# Patient Record
Sex: Female | Born: 1950
Health system: Southern US, Community
[De-identification: ages and names within clinical notes are randomized; demographics above are authoritative.]

## PROBLEM LIST (undated history)

## (undated) ENCOUNTER — Emergency Department (HOSPITAL_COMMUNITY): Discharge status: Absent without leave | Payer: PPO

## (undated) DIAGNOSIS — E669 Obesity, unspecified: Secondary | ICD-10-CM

## (undated) DIAGNOSIS — G35 Multiple sclerosis: Secondary | ICD-10-CM

## (undated) DIAGNOSIS — E785 Hyperlipidemia, unspecified: Secondary | ICD-10-CM

## (undated) DIAGNOSIS — J189 Pneumonia, unspecified organism: Secondary | ICD-10-CM

## (undated) DIAGNOSIS — L930 Discoid lupus erythematosus: Secondary | ICD-10-CM

## (undated) DIAGNOSIS — I10 Essential (primary) hypertension: Secondary | ICD-10-CM

## (undated) DIAGNOSIS — R002 Palpitations: Secondary | ICD-10-CM

## (undated) DIAGNOSIS — F17210 Nicotine dependence, cigarettes, uncomplicated: Secondary | ICD-10-CM

## (undated) DIAGNOSIS — M4800 Spinal stenosis, site unspecified: Secondary | ICD-10-CM

## (undated) DIAGNOSIS — M1990 Unspecified osteoarthritis, unspecified site: Secondary | ICD-10-CM

## (undated) DIAGNOSIS — M797 Fibromyalgia: Secondary | ICD-10-CM

## (undated) DIAGNOSIS — R011 Cardiac murmur, unspecified: Secondary | ICD-10-CM

## (undated) HISTORY — DX: Nicotine dependence, cigarettes, uncomplicated: F17.210

## (undated) HISTORY — DX: Essential (primary) hypertension: I10

## (undated) HISTORY — DX: Obesity, unspecified: E66.9

## (undated) HISTORY — DX: Hyperlipidemia, unspecified: E78.5

## (undated) HISTORY — DX: Spinal stenosis, site unspecified: M48.00

## (undated) HISTORY — PX: BREAST SURGERY: SHX581

## (undated) HISTORY — DX: Discoid lupus erythematosus: L93.0

---

## 1992-02-17 HISTORY — PX: CHOLECYSTECTOMY: SHX55

## 1993-02-16 HISTORY — PX: ABDOMINAL HYSTERECTOMY: SHX81

## 2009-03-18 ENCOUNTER — Emergency Department (HOSPITAL_COMMUNITY): Admission: EM | Admit: 2009-03-18 | Discharge: 2009-03-18 | Payer: Self-pay | Admitting: Emergency Medicine

## 2010-01-06 ENCOUNTER — Emergency Department (HOSPITAL_COMMUNITY): Admission: EM | Admit: 2010-01-06 | Discharge: 2010-01-06 | Payer: Self-pay | Admitting: Emergency Medicine

## 2010-04-29 LAB — URINALYSIS, ROUTINE W REFLEX MICROSCOPIC
Hgb urine dipstick: NEGATIVE
Nitrite: NEGATIVE
Protein, ur: NEGATIVE mg/dL
Specific Gravity, Urine: 1.029 (ref 1.005–1.030)
Urobilinogen, UA: 0.2 mg/dL (ref 0.0–1.0)

## 2010-04-29 LAB — COMPREHENSIVE METABOLIC PANEL
AST: 18 U/L (ref 0–37)
Albumin: 3.9 g/dL (ref 3.5–5.2)
Alkaline Phosphatase: 106 U/L (ref 39–117)
BUN: 14 mg/dL (ref 6–23)
Creatinine, Ser: 0.82 mg/dL (ref 0.4–1.2)
GFR calc Af Amer: >60 mL/min (ref >60)
Potassium: 4.3 mEq/L (ref 3.5–5.1)
Total Protein: 7.8 g/dL (ref 6.0–8.3)

## 2010-04-29 LAB — CBC
MCV: 85.6 fL (ref 78.0–100.0)
Platelets: 455 10*3/uL — ABNORMAL HIGH (ref 150–400)
RDW: 15.6 % — ABNORMAL HIGH (ref 11.5–15.5)
WBC: 12.4 10*3/uL — ABNORMAL HIGH (ref 4.0–10.5)

## 2010-04-29 LAB — POCT CARDIAC MARKERS
CKMB, poc: <1 ng/mL — ABNORMAL LOW (ref 1.0–8.0)
Myoglobin, poc: 70.3 ng/mL (ref 12–200)

## 2010-04-29 LAB — DIFFERENTIAL
Lymphocytes Relative: 24 % (ref 12–46)
Monocytes Absolute: 0.5 10*3/uL (ref 0.1–1.0)
Monocytes Relative: 4 % (ref 3–12)
Neutro Abs: 8.8 10*3/uL — ABNORMAL HIGH (ref 1.7–7.7)

## 2011-09-19 ENCOUNTER — Emergency Department (HOSPITAL_COMMUNITY)
Admission: EM | Admit: 2011-09-19 | Discharge: 2011-09-19 | Disposition: A | Discharge status: Home / Self Care | Payer: Medicare Other | Attending: Emergency Medicine | Admitting: Emergency Medicine

## 2011-09-19 ENCOUNTER — Emergency Department (HOSPITAL_COMMUNITY): Payer: Medicare Other

## 2011-09-19 DIAGNOSIS — R202 Paresthesia of skin: Secondary | ICD-10-CM

## 2011-09-19 DIAGNOSIS — IMO0001 Reserved for inherently not codable concepts without codable children: Secondary | ICD-10-CM | POA: Insufficient documentation

## 2011-09-19 DIAGNOSIS — R209 Unspecified disturbances of skin sensation: Secondary | ICD-10-CM | POA: Insufficient documentation

## 2011-09-19 DIAGNOSIS — R079 Chest pain, unspecified: Secondary | ICD-10-CM | POA: Insufficient documentation

## 2011-09-19 LAB — COMPREHENSIVE METABOLIC PANEL
ALT: 8 U/L (ref 0–35)
Alkaline Phosphatase: 105 U/L (ref 39–117)
CO2: 28 mEq/L (ref 19–32)
GFR calc Af Amer: >90 mL/min (ref >90)
GFR calc non Af Amer: >90 mL/min (ref >90)
Glucose, Bld: 87 mg/dL (ref 70–99)
Potassium: 4 mEq/L (ref 3.5–5.1)
Sodium: 138 mEq/L (ref 135–145)
Total Protein: 6.6 g/dL (ref 6.0–8.3)

## 2011-09-19 LAB — CBC WITH DIFFERENTIAL/PLATELET
Lymphocytes Relative: 47 % — ABNORMAL HIGH (ref 12–46)
Lymphs Abs: 3.7 10*3/uL (ref 0.7–4.0)
Neutro Abs: 3.3 10*3/uL (ref 1.7–7.7)
Neutrophils Relative %: 42 % — ABNORMAL LOW (ref 43–77)
Platelets: 384 10*3/uL (ref 150–400)
RBC: 4.91 MIL/uL (ref 3.87–5.11)
WBC: 7.9 10*3/uL (ref 4.0–10.5)

## 2011-09-19 MED ORDER — SODIUM CHLORIDE 0.9 % IV SOLN
Freq: Once | INTRAVENOUS | Status: AC
  Administered 2011-09-19: 16:00:00 via INTRAVENOUS

## 2011-09-19 NOTE — ED Notes (Signed)
C/o left sided chest pain under left breast radiating to left arm and left leg a- "all the way to my toes" . Not precipitated by any activity.

## 2011-09-19 NOTE — ED Provider Notes (Signed)
History     CSN: [id number]  Arrival date & time 09/19/11  1438   First MD Initiated Contact with Patient 09/19/11 1505      Chief Complaint  Patient presents with  . Chest Pain    started yesterday- left arm numb radiates to left foot  . Lupus  . Fibromyalgia    (Consider location/radiation/quality/duration/timing/severity/associated sxs/prior treatment) Patient is a 61 y.o. female presenting with chest pain. The history is provided by the patient.  Chest Pain   pt with left sided numbness at 24 hours lasting seconds to minutes--nothing makes sx better or worse--no headache or slurred speech--no prior h/o same--no anginal type sx--denies ataxia or visual changes--no tx used pta--  No past medical history on file.  No past surgical history on file.  No family history on file.  History  Substance Use Topics  . Smoking status: Not on file  . Smokeless tobacco: Not on file  . Alcohol Use: Not on file    OB History    No data available      Review of Systems  Cardiovascular: Positive for chest pain.  All other systems reviewed and are negative.    Allergies  Review of patient's allergies indicates no known allergies.  Home Medications   Current Outpatient Rx  Name Route Sig Dispense Refill  . ALBUTEROL SULFATE HFA 108 (90 BASE) MCG/ACT IN AERS Inhalation Inhale 2 puffs into the lungs every 6 (six) hours as needed. For shortness of breath.    . AMLODIPINE BESYLATE 5 MG PO TABS Oral Take 5 mg by mouth daily.    . ASPIRIN EC 81 MG PO TBEC Oral Take 162 mg by mouth daily.    . ATORVASTATIN CALCIUM 10 MG PO TABS Oral Take 10 mg by mouth daily.    . CYANOCOBALAMIN 1000 MCG/ML IJ SOLN Intramuscular Inject 1,000 mcg into the muscle once.    Marland Kitchen GABAPENTIN 300 MG PO CAPS Oral Take 300 mg by mouth 3 (three) times daily.    . MELOXICAM 15 MG PO TABS Oral Take 15 mg by mouth daily.    . TRAMADOL HCL 50 MG PO TABS Oral Take 100 mg by mouth 2 (two) times daily.      BP  145/85  Pulse 80  Temp 98.5 F (36.9 C) (Oral)  Resp 18  SpO2 97%  Physical Exam  Nursing note and vitals reviewed. Constitutional: She is oriented to person, place, and time. Vital signs are normal. She appears well-developed and well-nourished.  Non-toxic appearance. No distress.  HENT:  Head: Normocephalic and atraumatic.  Eyes: Conjunctivae, EOM and lids are normal. Pupils are equal, round, and reactive to light.  Neck: Normal range of motion. Neck supple. No tracheal deviation present. No mass present.  Cardiovascular: Normal rate, regular rhythm and normal heart sounds.  Exam reveals no gallop.   No murmur heard. Pulmonary/Chest: Effort normal and breath sounds normal. No stridor. No respiratory distress. She has no decreased breath sounds. She has no wheezes. She has no rhonchi. She has no rales.  Abdominal: Soft. Normal appearance and bowel sounds are normal. She exhibits no distension. There is no tenderness. There is no rebound and no CVA tenderness.  Musculoskeletal: Normal range of motion. She exhibits no edema and no tenderness.  Neurological: She is alert and oriented to person, place, and time. She has normal strength. No cranial nerve deficit or sensory deficit. GCS eye subscore is 4. GCS verbal subscore is 5. GCS motor subscore is 6.  Skin: Skin is warm and dry. No abrasion and no rash noted.  Psychiatric: She has a normal mood and affect. Her speech is normal and behavior is normal.    ED Course  Procedures (including critical care time)   Labs Reviewed  CBC WITH DIFFERENTIAL  COMPREHENSIVE METABOLIC PANEL   No results found.   No diagnosis found.    MDM   Date: 09/19/2011  Rate: 59  Rhythm: normal sinus rhythm  QRS Axis: normal  Intervals: normal  ST/T Wave abnormalities: normal  Conduction Disutrbances:none  Narrative Interpretation:   Old EKG Reviewed: unchanged  Head ct results noted, suspicious for MS, pt given neurology  referal          Toy Baker, MD 09/19/11 1700

## 2011-09-23 ENCOUNTER — Other Ambulatory Visit: Payer: Self-pay | Admitting: Neurology

## 2011-09-23 DIAGNOSIS — M329 Systemic lupus erythematosus, unspecified: Secondary | ICD-10-CM

## 2011-09-23 DIAGNOSIS — R202 Paresthesia of skin: Secondary | ICD-10-CM

## 2011-09-29 ENCOUNTER — Ambulatory Visit
Admission: RE | Admit: 2011-09-29 | Discharge: 2011-09-29 | Disposition: A | Discharge status: Home / Self Care | Payer: Medicare Other | Source: Ambulatory Visit | Attending: Neurology | Admitting: Neurology

## 2011-09-29 DIAGNOSIS — M329 Systemic lupus erythematosus, unspecified: Secondary | ICD-10-CM

## 2011-09-29 DIAGNOSIS — R202 Paresthesia of skin: Secondary | ICD-10-CM

## 2011-10-02 ENCOUNTER — Other Ambulatory Visit: Payer: Self-pay | Admitting: Neurology

## 2011-10-02 DIAGNOSIS — R9409 Abnormal results of other function studies of central nervous system: Secondary | ICD-10-CM

## 2011-10-02 DIAGNOSIS — R209 Unspecified disturbances of skin sensation: Secondary | ICD-10-CM

## 2011-10-02 DIAGNOSIS — M329 Systemic lupus erythematosus, unspecified: Secondary | ICD-10-CM

## 2011-10-09 ENCOUNTER — Ambulatory Visit
Admission: RE | Admit: 2011-10-09 | Discharge: 2011-10-09 | Disposition: A | Discharge status: Home / Self Care | Payer: Medicare Other | Source: Ambulatory Visit | Attending: Neurology | Admitting: Neurology

## 2011-10-09 ENCOUNTER — Other Ambulatory Visit: Payer: Self-pay | Admitting: Neurology

## 2011-10-09 DIAGNOSIS — M329 Systemic lupus erythematosus, unspecified: Secondary | ICD-10-CM

## 2011-10-09 DIAGNOSIS — R9409 Abnormal results of other function studies of central nervous system: Secondary | ICD-10-CM

## 2011-10-09 DIAGNOSIS — R209 Unspecified disturbances of skin sensation: Secondary | ICD-10-CM

## 2011-10-23 ENCOUNTER — Other Ambulatory Visit: Payer: Self-pay | Admitting: Neurology

## 2011-10-28 ENCOUNTER — Other Ambulatory Visit: Payer: Self-pay | Admitting: Neurology

## 2011-10-28 DIAGNOSIS — R202 Paresthesia of skin: Secondary | ICD-10-CM

## 2011-10-28 DIAGNOSIS — M329 Systemic lupus erythematosus, unspecified: Secondary | ICD-10-CM

## 2011-10-28 DIAGNOSIS — G35 Multiple sclerosis: Secondary | ICD-10-CM

## 2011-10-28 DIAGNOSIS — R9089 Other abnormal findings on diagnostic imaging of central nervous system: Secondary | ICD-10-CM

## 2011-11-02 ENCOUNTER — Ambulatory Visit
Admission: RE | Admit: 2011-11-02 | Discharge: 2011-11-02 | Disposition: A | Discharge status: Home / Self Care | Payer: Medicare Other | Source: Ambulatory Visit | Attending: Neurology | Admitting: Neurology

## 2011-11-02 VITALS — BP 130/78 | HR 76

## 2011-11-02 DIAGNOSIS — R9089 Other abnormal findings on diagnostic imaging of central nervous system: Secondary | ICD-10-CM

## 2011-11-02 DIAGNOSIS — M329 Systemic lupus erythematosus, unspecified: Secondary | ICD-10-CM

## 2011-11-02 DIAGNOSIS — R9409 Abnormal results of other function studies of central nervous system: Secondary | ICD-10-CM

## 2011-11-02 DIAGNOSIS — R209 Unspecified disturbances of skin sensation: Secondary | ICD-10-CM

## 2011-11-02 DIAGNOSIS — G35 Multiple sclerosis: Secondary | ICD-10-CM

## 2011-11-02 DIAGNOSIS — R202 Paresthesia of skin: Secondary | ICD-10-CM

## 2011-11-02 LAB — CSF CELL COUNT WITH DIFFERENTIAL
Eosinophils, CSF: 2 % — ABNORMAL HIGH (ref 0–1)
Monocyte/Macrophage: 11 % — ABNORMAL LOW (ref 15–45)
Segmented Neutrophils-CSF: 72 % — ABNORMAL HIGH (ref 0–6)

## 2011-11-02 NOTE — Progress Notes (Signed)
Pt's blood drawn to go with spinal fluid test, from right antecubital vein, site unremarkable and pt tolerated blood draw well. JKL RN

## 2011-11-02 NOTE — Progress Notes (Signed)
Patient resting quietly in nursing station with daughter at bedside.  Donell Sievert, RN

## 2011-11-03 LAB — GRAM STAIN: Gram Stain: NONE SEEN

## 2011-11-04 LAB — VDRL, CSF

## 2011-11-05 LAB — CNS IGG SYNTHESIS RATE, CSF+BLOOD
Albumin, Serum(Neph): 3.7 g/dL (ref 3.2–4.6)
IgG Index, CSF: 1.12 — ABNORMAL HIGH (ref <0.66)
IgG, CSF: 22.5 mg/dL — ABNORMAL HIGH (ref 0.8–7.7)
IgG, Serum: 1180 mg/dL (ref 694–1618)

## 2011-11-10 LAB — MULTIPLE SCLEROSIS PANEL 2
CNS-IgG Synthesis Rate: 394.6 mg/24hr — ABNORMAL HIGH (ref <3.3)
IgA CSF: 0.95 mg/dL — ABNORMAL HIGH (ref 0.15–0.60)
IgA Total: 307 mg/dL (ref 81–463)
IgG Total: 1118 mg/dL (ref 694–1618)
IgG-Index: 1.11 — ABNORMAL HIGH (ref <0.70)
IgM-CSF: >6 mg/dL — ABNORMAL HIGH (ref <0.10)

## 2011-11-12 ENCOUNTER — Ambulatory Visit: Payer: Medicare Other | Admitting: Physical Therapy

## 2011-11-17 ENCOUNTER — Ambulatory Visit: Payer: Medicare Other | Admitting: Physical Therapy

## 2011-11-25 ENCOUNTER — Ambulatory Visit: Payer: Medicare Other | Admitting: Physical Therapy

## 2011-12-22 ENCOUNTER — Ambulatory Visit: Payer: Medicare Other | Attending: Neurology | Admitting: Physical Therapy

## 2011-12-22 DIAGNOSIS — M6281 Muscle weakness (generalized): Secondary | ICD-10-CM | POA: Insufficient documentation

## 2011-12-22 DIAGNOSIS — IMO0001 Reserved for inherently not codable concepts without codable children: Secondary | ICD-10-CM | POA: Insufficient documentation

## 2011-12-31 ENCOUNTER — Ambulatory Visit: Payer: Medicare Other | Admitting: Physical Therapy

## 2012-01-06 ENCOUNTER — Ambulatory Visit: Payer: Medicare Other | Admitting: Physical Therapy

## 2012-01-08 ENCOUNTER — Ambulatory Visit: Payer: Medicare Other | Admitting: Physical Therapy

## 2012-01-11 ENCOUNTER — Ambulatory Visit: Payer: Medicare Other | Admitting: Physical Therapy

## 2012-01-20 ENCOUNTER — Ambulatory Visit: Payer: Medicare Other | Attending: Neurology | Admitting: Physical Therapy

## 2012-01-20 ENCOUNTER — Ambulatory Visit: Payer: Medicare Other | Admitting: *Deleted

## 2012-01-20 DIAGNOSIS — M6281 Muscle weakness (generalized): Secondary | ICD-10-CM | POA: Insufficient documentation

## 2012-01-20 DIAGNOSIS — IMO0001 Reserved for inherently not codable concepts without codable children: Secondary | ICD-10-CM | POA: Insufficient documentation

## 2012-01-22 ENCOUNTER — Ambulatory Visit: Payer: Medicare Other | Admitting: Physical Therapy

## 2012-02-15 ENCOUNTER — Ambulatory Visit: Payer: Medicare Other | Admitting: Occupational Therapy

## 2012-02-18 ENCOUNTER — Ambulatory Visit: Payer: Medicare Other | Attending: Neurology | Admitting: Occupational Therapy

## 2012-02-18 DIAGNOSIS — M6281 Muscle weakness (generalized): Secondary | ICD-10-CM | POA: Insufficient documentation

## 2012-02-18 DIAGNOSIS — IMO0001 Reserved for inherently not codable concepts without codable children: Secondary | ICD-10-CM | POA: Insufficient documentation

## 2012-02-18 DIAGNOSIS — R279 Unspecified lack of coordination: Secondary | ICD-10-CM | POA: Insufficient documentation

## 2012-02-18 DIAGNOSIS — R5381 Other malaise: Secondary | ICD-10-CM | POA: Insufficient documentation

## 2012-02-18 DIAGNOSIS — R41841 Cognitive communication deficit: Secondary | ICD-10-CM | POA: Insufficient documentation

## 2012-02-23 ENCOUNTER — Encounter: Payer: Medicare Other | Admitting: Occupational Therapy

## 2012-02-25 ENCOUNTER — Ambulatory Visit: Payer: Medicare Other | Admitting: Occupational Therapy

## 2012-03-01 ENCOUNTER — Ambulatory Visit: Payer: Medicare Other | Admitting: Occupational Therapy

## 2012-03-03 ENCOUNTER — Ambulatory Visit: Payer: Medicare Other | Admitting: Occupational Therapy

## 2012-03-08 ENCOUNTER — Ambulatory Visit: Payer: Medicare Other | Admitting: Occupational Therapy

## 2012-03-10 ENCOUNTER — Ambulatory Visit: Payer: Medicare Other | Admitting: Occupational Therapy

## 2012-03-17 ENCOUNTER — Ambulatory Visit: Payer: Medicare Other | Admitting: Occupational Therapy

## 2012-03-22 ENCOUNTER — Ambulatory Visit: Payer: Medicare Other | Attending: Neurology | Admitting: Occupational Therapy

## 2012-03-22 DIAGNOSIS — IMO0001 Reserved for inherently not codable concepts without codable children: Secondary | ICD-10-CM | POA: Insufficient documentation

## 2012-03-22 DIAGNOSIS — M6281 Muscle weakness (generalized): Secondary | ICD-10-CM | POA: Insufficient documentation

## 2012-03-24 ENCOUNTER — Ambulatory Visit: Payer: Medicare Other | Admitting: Occupational Therapy

## 2012-03-31 ENCOUNTER — Ambulatory Visit: Payer: Medicare Other | Admitting: Occupational Therapy

## 2012-04-14 ENCOUNTER — Ambulatory Visit: Payer: Medicare Other | Admitting: Occupational Therapy

## 2012-05-04 ENCOUNTER — Ambulatory Visit: Payer: Medicare Other | Attending: Neurology | Admitting: Physical Therapy

## 2012-05-04 ENCOUNTER — Ambulatory Visit: Payer: Medicare Other | Admitting: Occupational Therapy

## 2012-05-04 DIAGNOSIS — IMO0001 Reserved for inherently not codable concepts without codable children: Secondary | ICD-10-CM | POA: Insufficient documentation

## 2012-05-04 DIAGNOSIS — M6281 Muscle weakness (generalized): Secondary | ICD-10-CM | POA: Insufficient documentation

## 2012-05-11 ENCOUNTER — Emergency Department (HOSPITAL_COMMUNITY)
Admission: EM | Admit: 2012-05-11 | Discharge: 2012-05-11 | Disposition: A | Discharge status: Home / Self Care | Payer: Medicare Other | Attending: Emergency Medicine | Admitting: Emergency Medicine

## 2012-05-11 ENCOUNTER — Encounter (HOSPITAL_COMMUNITY): Payer: Self-pay | Admitting: Emergency Medicine

## 2012-05-11 ENCOUNTER — Emergency Department (HOSPITAL_COMMUNITY): Payer: Medicare Other

## 2012-05-11 DIAGNOSIS — Z8739 Personal history of other diseases of the musculoskeletal system and connective tissue: Secondary | ICD-10-CM | POA: Insufficient documentation

## 2012-05-11 DIAGNOSIS — F17200 Nicotine dependence, unspecified, uncomplicated: Secondary | ICD-10-CM | POA: Insufficient documentation

## 2012-05-11 DIAGNOSIS — Z79899 Other long term (current) drug therapy: Secondary | ICD-10-CM | POA: Insufficient documentation

## 2012-05-11 DIAGNOSIS — Z7982 Long term (current) use of aspirin: Secondary | ICD-10-CM | POA: Insufficient documentation

## 2012-05-11 DIAGNOSIS — I10 Essential (primary) hypertension: Secondary | ICD-10-CM | POA: Insufficient documentation

## 2012-05-11 DIAGNOSIS — W010XXA Fall on same level from slipping, tripping and stumbling without subsequent striking against object, initial encounter: Secondary | ICD-10-CM | POA: Insufficient documentation

## 2012-05-11 DIAGNOSIS — Y9301 Activity, walking, marching and hiking: Secondary | ICD-10-CM | POA: Insufficient documentation

## 2012-05-11 DIAGNOSIS — Y9289 Other specified places as the place of occurrence of the external cause: Secondary | ICD-10-CM | POA: Insufficient documentation

## 2012-05-11 DIAGNOSIS — S93409A Sprain of unspecified ligament of unspecified ankle, initial encounter: Secondary | ICD-10-CM | POA: Insufficient documentation

## 2012-05-11 DIAGNOSIS — Z8669 Personal history of other diseases of the nervous system and sense organs: Secondary | ICD-10-CM | POA: Insufficient documentation

## 2012-05-11 HISTORY — DX: Fibromyalgia: M79.7

## 2012-05-11 HISTORY — DX: Multiple sclerosis: G35

## 2012-05-11 MED ORDER — OXYCODONE-ACETAMINOPHEN 5-325 MG PO TABS: ORAL_TABLET | ORAL | Status: DC

## 2012-05-11 NOTE — ED Provider Notes (Signed)
Medical screening examination/treatment/procedure(s) were performed by non-physician practitioner and as supervising physician I was immediately available for consultation/collaboration.  [name], MD 05/11/12 [phone]

## 2012-05-11 NOTE — ED Notes (Signed)
Pt states that she was walking on the driveway last Wednesday night and slipped and fell and her L leg below the knee is still hurting. No LOC,or  head trauma.

## 2012-05-11 NOTE — Progress Notes (Signed)
Pt confirms pcp is dwight williams EPIC updated  

## 2012-05-11 NOTE — ED Provider Notes (Signed)
History    This chart was scribed for non-physician practitioner working with Andrea Chick, MD by Andrea Cantrell, ED Scribe. This patient was seen in room WTR7/WTR7 and the patient's care was started at 4:09PM.   CSN: [id number]  Arrival date & time 05/11/12  1542   First MD Initiated Contact with Patient 05/11/12 1609      Chief Complaint  Patient presents with  . Leg Pain  . Fall    (Consider location/radiation/quality/duration/timing/severity/associated sxs/prior treatment) The history is provided by the patient. No language interpreter was used.    Andrea Cantrell is a 62 y.o. female , with a hx of hypertension, MS, fibromyalgia, who presents to the Emergency Department complaining of sudden, progressively worsening, leg pain located at the LLE, radiating downwards towards the left ankle, onset seven days ago (05/04/12).  Associated symptoms include swelling located at the LLE. The pt reports she was walking in her driveway last Wednesday evening, 05/04/12, where she suddenly slipped, fell, and impacted directly upon her LLE. Furthermore, the pt informs she can ambulate at the present time, however, the ambulation produces a severe pain sensation. The pt has applied a cold compress and elevated the LLE, however, neither of which provide relief of the leg pain.  The pt denies any preceding symptoms before the fall incident, LOC, and experiencing any recent SOB. Furthermore, the pt denies any hx of blood clots and taking estrogen/hormone replacements.  The pt is a current everyday smoker, (1.0 packs/day). However, she does not drink alcohol.      Past Medical History  Diagnosis Date  . Hypertension   . Fibromyalgia   . Lupus   . MS (multiple sclerosis)     Past Surgical History  Procedure Laterality Date  . Abdominal hysterectomy    . Cholecystectomy    . Cesarean section    . Breast surgery Left     tissue removal    No family history on file.  History  Substance Use  Topics  . Smoking status: Current Every Day Smoker -- 1.00 packs/day  . Smokeless tobacco: Not on file  . Alcohol Use: No    OB History   Grav Para Term Preterm Abortions TAB SAB Ect Mult Living                  Review of Systems  Respiratory: Negative for shortness of breath.   Cardiovascular: Negative for chest pain.  Gastrointestinal: Negative for diarrhea.  Musculoskeletal: Positive for arthralgias.  All other systems reviewed and are negative.    Allergies  Review of patient's allergies indicates no known allergies.  Home Medications   Current Outpatient Rx  Name  Route  Sig  Dispense  Refill  . albuterol (PROVENTIL HFA;VENTOLIN HFA) 108 (90 BASE) MCG/ACT inhaler   Inhalation   Inhale 2 puffs into the lungs every 6 (six) hours as needed. For shortness of breath.         Marland Kitchen amLODipine (NORVASC) 5 MG tablet   Oral   Take 5 mg by mouth daily.         Marland Kitchen aspirin EC 81 MG tablet   Oral   Take 162 mg by mouth daily.         Marland Kitchen atorvastatin (LIPITOR) 10 MG tablet   Oral   Take 10 mg by mouth daily.         . cyanocobalamin (,VITAMIN B-12,) 1000 MCG/ML injection   Intramuscular   Inject 1,000 mcg into the muscle  once.         . cyclobenzaprine (FLEXERIL) 5 MG tablet   Oral   Take 5 mg by mouth 3 (three) times daily as needed for muscle spasms.         Marland Kitchen gabapentin (NEURONTIN) 300 MG capsule   Oral   Take 300 mg by mouth 3 (three) times daily.         . meloxicam (MOBIC) 15 MG tablet   Oral   Take 15 mg by mouth daily.         . traMADol (ULTRAM) 50 MG tablet   Oral   Take 100 mg by mouth 2 (two) times daily.           BP 146/82  Pulse 96  Temp(Src) 98.3 F (36.8 C) (Oral)  SpO2 97%  Physical Exam  Nursing note and vitals reviewed. Constitutional: She is oriented to person, place, and time. She appears well-developed and well-nourished. No distress.  HENT:  Head: Normocephalic.  Eyes: Conjunctivae and EOM are normal.   Cardiovascular: Normal rate and intact distal pulses.   Good distal pulses. 3 + bilaterally.   Pulmonary/Chest: Effort normal. No stridor.  Musculoskeletal: Normal range of motion. She exhibits tenderness.       Left ankle: Tenderness. Lateral malleolus tenderness found.  Tenderness detected at the distal lateral malleolus. Mild swelling of the lateral portion of left ankle. Neurovascularly intact. Full ROM to both knees. 5/5 motor strength bilaterally to the lower extremities,   Neurological: She is alert and oriented to person, place, and time.  Psychiatric: She has a normal mood and affect.    ED Course  Procedures (including critical care time)  DIAGNOSTIC STUDIES: Oxygen Saturation is 97% on room air, normal by my interpretation.    COORDINATION OF CARE:   4:24 PM- Treatment plan concerning x-ray of LLE discussed with patient. Pt refuses pain medications at this time.     Labs Reviewed - No data to display  Dg Ankle Complete Left  05/11/2012  *RADIOLOGY REPORT*  Clinical Data: Pain post fall  LEFT ANKLE COMPLETE - 3+ VIEW  Comparison: None.  Findings: Three views of the left ankle submitted.  No acute fracture or subluxation.  Ankle mortise is preserved.  Soft tissue swelling noted adjacent to lateral malleolus. Plantar spur of the calcaneus is noted.  IMPRESSION: No acute fracture or subluxation.  Lateral soft tissue swelling.   Original Report Authenticated By: Natasha Mead, M.D.       1. Ankle sprain and strain, left, initial encounter       MDM   Patient with left lower extremity pain and swelling status post slip and fall a week ago. No signs of DVT. X-ray is negative. We'll treat as a sprain, and recommend RICE.  Filed Vitals:   05/11/12 1602  BP: 146/82  Pulse: 96  Temp: 98.3 F (36.8 C)  TempSrc: Oral  SpO2: 97%     Pt verbalized understanding and agrees with care plan. Outpatient follow-up and return precautions given.    New Prescriptions    OXYCODONE-ACETAMINOPHEN (PERCOCET/ROXICET) 5-325 MG PER TABLET    1 to 2 tabs PO q6hrs  PRN for pain    I personally performed the services described in this documentation, which was scribed in my presence. The recorded information has been reviewed and is accurate.    Wynetta Emery, PA-C 05/11/12 1701

## 2012-06-10 ENCOUNTER — Ambulatory Visit: Payer: Self-pay | Admitting: Nurse Practitioner

## 2012-07-13 ENCOUNTER — Ambulatory Visit: Payer: Self-pay | Admitting: Nurse Practitioner

## 2012-07-26 ENCOUNTER — Encounter: Payer: Self-pay | Admitting: Nurse Practitioner

## 2012-07-26 ENCOUNTER — Ambulatory Visit (INDEPENDENT_AMBULATORY_CARE_PROVIDER_SITE_OTHER): Payer: Medicare Other | Admitting: Nurse Practitioner

## 2012-07-26 VITALS — BP 132/81 | HR 80 | Ht 63.0 in | Wt 203.5 lb

## 2012-07-26 DIAGNOSIS — G35 Multiple sclerosis: Secondary | ICD-10-CM

## 2012-07-26 DIAGNOSIS — R209 Unspecified disturbances of skin sensation: Secondary | ICD-10-CM | POA: Insufficient documentation

## 2012-07-26 DIAGNOSIS — R9409 Abnormal results of other function studies of central nervous system: Secondary | ICD-10-CM | POA: Insufficient documentation

## 2012-07-26 DIAGNOSIS — Z79899 Other long term (current) drug therapy: Secondary | ICD-10-CM

## 2012-07-26 NOTE — Patient Instructions (Addendum)
Continue tecfidera twice daily Will check labs today to monitor side effects of the drug Followup in 6 months

## 2012-07-26 NOTE — Progress Notes (Signed)
HPI: Patient returns for followup after last visit with Dr. Terrace Arabia to 2/7/ 2014. She has a history of multiple sclerosis as well as hypertension hyperlipidemia fibromyalgia and lupus in the 1990s. She rarely has a flareup. MRI scan of the brain shows multiple periventricular lesions solitary enhancing left frontal subcortical lesion with multiple nonenhancing periventricular and subcortical hypointense lesions consistent with myelinating disease. MRI of the cervical spine with mild degenerative changes but no enhancing lesions are noted. She was started on tecfidera in October 2013 and  has tolerated the medication extremely well. Initially she thought she developed a rash but apparently that was due to a GI virus and that has disappeared. She sprained her ankle in April and still has some swelling from that occasionally. No new complaints.  ROS:  Blurred vision, joint pain anxiety  Physical Exam General: well developed, well nourished, seated, in no evident distress Head: head normocephalic and atraumatic. Oropharynx benign Neck: supple with no carotid  bruits Cardiovascular: regular rate and rhythm, no murmurs  Neurologic Exam Mental Status: Awake and fully alert. Oriented to place and time. Follows all commands. Speech and language normal.   Cranial Nerves: Fundoscopic exam reveals sharp disc margins. Pupils equal, briskly reactive to light. Extraocular movements full without nystagmus. Visual fields full to confrontation. Hearing intact and symmetric to finger snap. Facial sensation intact. Face, tongue, palate move normally and symmetrically. Neck flexion and extension normal.  Motor: Normal bulk and tone. Normal strength in all tested extremity muscles.No focal weakness Sensory.: intact to touch and pinprick and vibratory.  Coordination: Rapid alternating movements normal in all extremities. Finger-to-nose and heel-to-shin performed accurately bilaterally. Gait and Station: Arises from chair  without difficulty. Stance is normal. Gait demonstrates normal stride length and balance . Able to heel, toe and tandem walk without difficulty.  Reflexes: 2+ and symmetric. Toes downgoing.     ASSESSMENT: Abnormal MRI scan of the brain most consistent with multiple sclerosis, also supported by elevated IgG. Index.     PLAN: She will continue her tecfidera twice daily Will check labs today to monitor side effects of the drug Followup in 6 months  Nilda Riggs, GNP-BC APRN

## 2012-07-28 LAB — COMPREHENSIVE METABOLIC PANEL
ALT: 15 IU/L (ref 0–32)
AST: 17 IU/L (ref 0–40)
Albumin/Globulin Ratio: 1.6 (ref 1.1–2.5)
Albumin: 4.2 g/dL (ref 3.6–4.8)
Alkaline Phosphatase: 123 IU/L — ABNORMAL HIGH (ref 39–117)
BUN: 19 mg/dL (ref 8–27)
Calcium: 9.5 mg/dL (ref 8.6–10.2)
Creatinine, Ser: 0.65 mg/dL (ref 0.57–1.00)
GFR calc Af Amer: 110 mL/min/{1.73_m2} (ref >59)
GFR calc non Af Amer: 95 mL/min/{1.73_m2} (ref >59)
Glucose: 83 mg/dL (ref 65–99)
Total Bilirubin: 0.5 mg/dL (ref 0.0–1.2)
Total Protein: 6.9 g/dL (ref 6.0–8.5)

## 2012-07-28 LAB — CBC WITH DIFFERENTIAL/PLATELET
Basophils Absolute: 0 10*3/uL (ref 0.0–0.2)
Eos: 2 % (ref 0–5)
HCT: 40.8 % (ref 34.0–46.6)
Lymphs: 32 % (ref 14–46)
Monocytes: 10 % (ref 4–12)
Neutrophils Relative %: 56 % (ref 40–74)
RDW: 16 % — ABNORMAL HIGH (ref 12.3–15.4)
WBC: 6.7 10*3/uL (ref 3.4–10.8)

## 2012-08-02 NOTE — Progress Notes (Signed)
Quick Note:  Left a message on the pt's home voice mail (vm was in the patient's voice and she stated her name) regarding her recent labs being within normal limits. Contact information was given so that she may call with any questions or concerns.   ______ 

## 2013-01-25 ENCOUNTER — Encounter: Payer: Self-pay | Admitting: Nurse Practitioner

## 2013-01-25 ENCOUNTER — Ambulatory Visit (INDEPENDENT_AMBULATORY_CARE_PROVIDER_SITE_OTHER): Payer: Medicare Other | Admitting: Nurse Practitioner

## 2013-01-25 ENCOUNTER — Encounter (INDEPENDENT_AMBULATORY_CARE_PROVIDER_SITE_OTHER): Payer: Self-pay

## 2013-01-25 VITALS — BP 124/76 | HR 77 | Ht 62.5 in | Wt 214.0 lb

## 2013-01-25 DIAGNOSIS — G35 Multiple sclerosis: Secondary | ICD-10-CM

## 2013-01-25 DIAGNOSIS — R9409 Abnormal results of other function studies of central nervous system: Secondary | ICD-10-CM

## 2013-01-25 DIAGNOSIS — R209 Unspecified disturbances of skin sensation: Secondary | ICD-10-CM

## 2013-01-25 DIAGNOSIS — Z79899 Other long term (current) drug therapy: Secondary | ICD-10-CM

## 2013-01-25 LAB — CBC WITH DIFFERENTIAL/PLATELET
Basos: 0 %
Eosinophils Absolute: 0.1 10*3/uL (ref 0.0–0.4)
Lymphs: 21 %
MCH: 28.1 pg (ref 26.6–33.0)
Neutrophils Relative %: 70 %
RBC: 5.02 x10E6/uL (ref 3.77–5.28)
WBC: 6.6 10*3/uL (ref 3.4–10.8)

## 2013-01-25 LAB — COMPREHENSIVE METABOLIC PANEL
Albumin: 4 g/dL (ref 3.6–4.8)
Alkaline Phosphatase: 122 IU/L — ABNORMAL HIGH (ref 39–117)
BUN/Creatinine Ratio: 23 (ref 11–26)
BUN: 14 mg/dL (ref 8–27)
CO2: 31 mmol/L — ABNORMAL HIGH (ref 18–29)
Calcium: 9.4 mg/dL (ref 8.6–10.2)
Creatinine, Ser: 0.61 mg/dL (ref 0.57–1.00)
Globulin, Total: 3.2 g/dL (ref 1.5–4.5)

## 2013-01-25 MED ORDER — DIMETHYL FUMARATE 240 MG PO CPDR: 240.0000 mg | DELAYED_RELEASE_CAPSULE | Freq: Two times a day (BID) | ORAL | Status: DC

## 2013-01-25 NOTE — Progress Notes (Signed)
[facility]  PATIENT: Andrea Cantrell DOB: 04/29/1950   REASON FOR VISIT: follow up for Andrea   HISTORY OF PRESENT ILLNESS:Andrea Cantrell, 62 year old female returns for followup. She has relapsing remitting multiple sclerosis. MRI scan of the brain 10/02/2011 with multiple periventricular lesions, solitary enhancing left frontal subcortical lesion with multiple nonenhancing periventricular and subcortical hyperintense lesion consistent with demyelinating disease. She has not had exacerbation of symptoms since last seen. She is currently on tecfidera twice daily tolerating the medication without side effects. She has not had balance issues, no weakness, no sensory changes no speech or swallowing difficulty, no bowel or bladder difficulty.      HISTORY:She has a history of multiple sclerosis as well as hypertension hyperlipidemia fibromyalgia and lupus in the 1990s. She rarely has a flareup. MRI scan of the brain shows multiple periventricular lesions solitary enhancing left frontal subcortical lesion with multiple nonenhancing periventricular and subcortical hypointense lesions consistent with myelinating disease. MRI of the cervical spine with mild degenerative changes but no enhancing lesions are noted. She was started on tecfidera in October 2013 and has tolerated the medication extremely well. Initially she thought she developed a rash but apparently that was due to a GI virus and that has disappeared. She sprained her ankle in April and still has some swelling from that occasionally. No new complaints.   REVIEW OF SYSTEMS: Full 14 system review of systems performed and notable only for those listed, all others are neg:  Constitutional: N/A  Cardiovascular: N/A  Ear/Nose/Throat: N/A  Skin: N/A  Eyes:  Blurred vision Respiratory: N/A  Gastroitestinal: N/A  Hematology/Lymphatic: N/A  Endocrine:  Heat intolerance Musculoskeletal: Joint pain, achy  muscles Allergy/Immunology: N/A  Neurological:  Memory loss, headache Psychiatric:  Depression  ALLERGIES: No Known Allergies  HOME MEDICATIONS: Outpatient Prescriptions Prior to Visit  Medication Sig Dispense Refill  . albuterol (PROVENTIL HFA;VENTOLIN HFA) 108 (90 BASE) MCG/ACT inhaler Inhale 2 puffs into the lungs every 6 (six) hours as needed. For shortness of breath.      Marland Kitchen amLODipine (NORVASC) 5 MG tablet Take 5 mg by mouth daily.      Marland Kitchen aspirin EC 81 MG tablet Take 162 mg by mouth daily.      Marland Kitchen atorvastatin (LIPITOR) 10 MG tablet Take 10 mg by mouth daily.      . cyanocobalamin (,VITAMIN B-12,) 1000 MCG/ML injection Inject 1,000 mcg into the muscle once.      . cyclobenzaprine (FLEXERIL) 5 MG tablet Take 5 mg by mouth 3 (three) times daily as needed for muscle spasms.      Marland Kitchen gabapentin (NEURONTIN) 300 MG capsule Take 300 mg by mouth 3 (three) times daily.       . meloxicam (MOBIC) 15 MG tablet Take 15 mg by mouth daily.      . NORVASC 5 MG tablet       . permethrin (ELIMITE) 5 % cream       . TECFIDERA 240 MG CPDR Take 240 mg by mouth 2 (two) times daily.       . traMADol (ULTRAM) 50 MG tablet Take 100 mg by mouth 2 (two) times daily.      . hydrOXYzine (ATARAX/VISTARIL) 50 MG tablet       . oxyCODONE-acetaminophen (PERCOCET/ROXICET) 5-325 MG per tablet 1 to 2 tabs PO q6hrs  PRN for pain  15 tablet  0   No facility-administered medications prior to visit.    PAST MEDICAL HISTORY: Past Medical History  Diagnosis Date  . Hypertension   . Fibromyalgia   . Lupus   . Andrea (multiple sclerosis)     PAST SURGICAL HISTORY: Past Surgical History  Procedure Laterality Date  . Abdominal hysterectomy    . Cholecystectomy    . Cesarean section    . Breast surgery Left     tissue removal    FAMILY HISTORY: History reviewed. No pertinent family history.  SOCIAL HISTORY: History   Social History  . Marital Status: Legally Separated    Spouse Name: N/A    Number of  Children: 2  . Years of Education: 12+   Occupational History  . Not on file.   Social History Main Topics  . Smoking status: Current Every Day Smoker -- 1.00 packs/day  . Smokeless tobacco: Never Used  . Alcohol Use: No  . Drug Use: No  . Sexual Activity: Not on file   Other Topics Concern  . Not on file   Social History Narrative   Patient lives at home with granddaughter her her 2 children.    Patient has 2 children.    Patient has some college.   Patient is on Disability.    Patient is separated.      PHYSICAL EXAM  Filed Vitals:   01/25/13 1031  BP: 124/76  Pulse: 77  Height: 5' 2.5" (1.588 m)  Weight: 214 lb (97.07 kg)   Body mass index is 38.49 kg/(m^2).  Generalized: Well developed, in no acute distress  Head: normocephalic and atraumatic,. Oropharynx benign  Neck: Supple, no carotid bruits  Cardiac: Regular rate rhythm, no murmur    Neurological examination   Mentation: Alert oriented to time, place, history taking. Follows all commands speech and language fluent  Cranial nerve II-XII: Fundoscopic exam reveals sharp disc margins.Pupils were equal round reactive to light extraocular movements were full, visual field were full on confrontational test. Facial sensation and strength were normal. hearing was intact to finger rubbing bilaterally. Uvula tongue midline. head turning and shoulder shrug were normal and symmetric.Tongue protrusion into cheek strength was normal. Motor: normal bulk and tone, full strength in the BUE, BLE, fine finger movements normal, no pronator drift. No focal weakness Sensory: normal and symmetric to light touch, pinprick, and  vibration  Coordination: finger-nose-finger, heel-to-shin bilaterally, no dysmetria Reflexes: Brachioradialis 2/2, biceps 2/2, triceps 2/2, patellar 2/2, Achilles 2/2, plantar responses were flexor bilaterally. Gait and Station: Rising up from seated position without assistance, normal stance,  moderate  stride, good arm swing, smooth turning, able to perform tiptoe, and heel walking without difficulty. Tandem gait is steady  DIAGNOSTIC DATA (LABS, IMAGING, TESTING) - I reviewed patient records, labs, notes, testing and imaging myself where available.  Lab Results  Component Value Date   WBC 6.7 07/28/2012   HGB 14.0 07/28/2012   HCT 40.8 07/28/2012   MCV 81 07/28/2012   PLT 384 09/19/2011      Component Value Date/Time   NA 141 07/28/2012 1049   NA 138 09/19/2011 1551   K 5.2 07/28/2012 1049   CL 103 07/28/2012 1049   CO2 30* 07/28/2012 1049   GLUCOSE 83 07/28/2012 1049   GLUCOSE 87 09/19/2011 1551   BUN 19 07/28/2012 1049   BUN 16 09/19/2011 1551   CREATININE 0.65 07/28/2012 1049   CALCIUM 9.5 07/28/2012 1049   PROT 6.9 07/28/2012 1049   PROT 6.6 09/19/2011 1551   ALBUMIN 3.5 09/19/2011 1551   AST 17 07/28/2012 1049   ALT 15 07/28/2012 1049  ALKPHOS 123* 07/28/2012 1049   BILITOT 0.5 07/28/2012 1049   GFRNONAA 95 07/28/2012 1049   GFRAA 110 07/28/2012 1049     ASSESSMENT AND PLAN  62 y.o. year old female  has a past medical history of Hypertension; Fibromyalgia; Lupus; and Andrea (multiple sclerosis). here to follow up for Andrea. Currently on Tecfidera without side effects  Will check labs today Continue Tecfidera will refill F/U 6 months Nilda Riggs, [facility], [facility], APRN  [facility] [address], [address] [place], [place] [address] [phone]

## 2013-01-25 NOTE — Patient Instructions (Signed)
Will check labs today Continue Tecfidera will refill F/U 6 months

## 2013-01-26 ENCOUNTER — Telehealth: Payer: Self-pay

## 2013-01-26 NOTE — Telephone Encounter (Signed)
Message copied by Doree Barthel on Thu Jan 26, 2013  8:12 AM ------      Message from: Beverely Low      Created: Wed Jan 25, 2013  4:43 PM       Labs look good. Please call patient            ----- Message -----         From: [facility] Lab Results In Interface         Sent: 01/25/2013   4:40 PM           To: Nilda Riggs, NP                   ------

## 2013-01-26 NOTE — Telephone Encounter (Signed)
Called patient and informed

## 2013-02-14 ENCOUNTER — Encounter: Payer: Self-pay | Admitting: Nurse Practitioner

## 2013-07-05 ENCOUNTER — Encounter (HOSPITAL_COMMUNITY): Payer: Self-pay | Admitting: Emergency Medicine

## 2013-07-05 ENCOUNTER — Emergency Department (HOSPITAL_COMMUNITY): Payer: Medicare Other

## 2013-07-05 ENCOUNTER — Emergency Department (HOSPITAL_COMMUNITY)
Admission: EM | Admit: 2013-07-05 | Discharge: 2013-07-05 | Disposition: A | Discharge status: Home / Self Care | Payer: Medicare Other | Attending: Emergency Medicine | Admitting: Emergency Medicine

## 2013-07-05 DIAGNOSIS — R42 Dizziness and giddiness: Secondary | ICD-10-CM | POA: Insufficient documentation

## 2013-07-05 DIAGNOSIS — I10 Essential (primary) hypertension: Secondary | ICD-10-CM | POA: Insufficient documentation

## 2013-07-05 DIAGNOSIS — F411 Generalized anxiety disorder: Secondary | ICD-10-CM | POA: Insufficient documentation

## 2013-07-05 DIAGNOSIS — Z7982 Long term (current) use of aspirin: Secondary | ICD-10-CM | POA: Insufficient documentation

## 2013-07-05 DIAGNOSIS — IMO0002 Reserved for concepts with insufficient information to code with codable children: Secondary | ICD-10-CM | POA: Insufficient documentation

## 2013-07-05 DIAGNOSIS — R55 Syncope and collapse: Secondary | ICD-10-CM | POA: Insufficient documentation

## 2013-07-05 DIAGNOSIS — IMO0001 Reserved for inherently not codable concepts without codable children: Secondary | ICD-10-CM | POA: Insufficient documentation

## 2013-07-05 DIAGNOSIS — Z79899 Other long term (current) drug therapy: Secondary | ICD-10-CM | POA: Insufficient documentation

## 2013-07-05 DIAGNOSIS — F17200 Nicotine dependence, unspecified, uncomplicated: Secondary | ICD-10-CM | POA: Insufficient documentation

## 2013-07-05 DIAGNOSIS — M329 Systemic lupus erythematosus, unspecified: Secondary | ICD-10-CM | POA: Insufficient documentation

## 2013-07-05 DIAGNOSIS — R079 Chest pain, unspecified: Secondary | ICD-10-CM | POA: Insufficient documentation

## 2013-07-05 DIAGNOSIS — G35 Multiple sclerosis: Secondary | ICD-10-CM | POA: Insufficient documentation

## 2013-07-05 LAB — COMPREHENSIVE METABOLIC PANEL
ALBUMIN: 4.1 g/dL (ref 3.5–5.2)
ALT: 19 U/L (ref 0–35)
AST: 18 U/L (ref 0–37)
Alkaline Phosphatase: 140 U/L — ABNORMAL HIGH (ref 39–117)
BILIRUBIN TOTAL: 0.4 mg/dL (ref 0.3–1.2)
BUN: 14 mg/dL (ref 6–23)
CHLORIDE: 99 meq/L (ref 96–112)
CO2: 25 meq/L (ref 19–32)
CREATININE: 0.63 mg/dL (ref 0.50–1.10)
Calcium: 9.4 mg/dL (ref 8.4–10.5)
GFR calc Af Amer: >90 mL/min (ref >90)
Glucose, Bld: 96 mg/dL (ref 70–99)
POTASSIUM: 4.3 meq/L (ref 3.7–5.3)
SODIUM: 139 meq/L (ref 137–147)
Total Protein: 8.1 g/dL (ref 6.0–8.3)

## 2013-07-05 LAB — CBC WITH DIFFERENTIAL/PLATELET
BASOS ABS: 0 10*3/uL (ref 0.0–0.1)
BASOS PCT: 0 % (ref 0–1)
Eosinophils Absolute: 0.1 10*3/uL (ref 0.0–0.7)
Eosinophils Relative: 1 % (ref 0–5)
HCT: 44.6 % (ref 36.0–46.0)
Hemoglobin: 15.3 g/dL — ABNORMAL HIGH (ref 12.0–15.0)
LYMPHS PCT: 22 % (ref 12–46)
Lymphs Abs: 1.4 10*3/uL (ref 0.7–4.0)
MCH: 27.5 pg (ref 26.0–34.0)
MCHC: 34.3 g/dL (ref 30.0–36.0)
MCV: 80.1 fL (ref 78.0–100.0)
Monocytes Absolute: 0.3 10*3/uL (ref 0.1–1.0)
Monocytes Relative: 5 % (ref 3–12)
NEUTROS ABS: 4.8 10*3/uL (ref 1.7–7.7)
NEUTROS PCT: 72 % (ref 43–77)
PLATELETS: 427 10*3/uL — AB (ref 150–400)
RBC: 5.57 MIL/uL — ABNORMAL HIGH (ref 3.87–5.11)
RDW: 16 % — AB (ref 11.5–15.5)
WBC: 6.7 10*3/uL (ref 4.0–10.5)

## 2013-07-05 LAB — I-STAT TROPONIN, ED: Troponin i, poc: 0 ng/mL (ref 0.00–0.08)

## 2013-07-05 MED ORDER — TRAMADOL HCL 50 MG PO TABS: 100.0000 mg | ORAL_TABLET | Freq: Three times a day (TID) | ORAL | Status: DC | PRN

## 2013-07-05 MED ORDER — SODIUM CHLORIDE 0.9 % IV BOLUS (SEPSIS)
1000.0000 mL | Freq: Once | INTRAVENOUS | Status: AC
  Administered 2013-07-05: 1000 mL via INTRAVENOUS

## 2013-07-05 MED ORDER — MORPHINE SULFATE 4 MG/ML IJ SOLN
4.0000 mg | Freq: Once | INTRAMUSCULAR | Status: AC
  Administered 2013-07-05: 4 mg via INTRAVENOUS
  Filled 2013-07-05: qty 1

## 2013-07-05 NOTE — Discharge Instructions (Signed)
You can take tramadol 100 mg three times a day for several days then back down to your usual dose.   Follow up with your doctor.   No heavy lifting. Stay hydrated.   Return to ER if you have severe chest pain, shortness of breath.

## 2013-07-05 NOTE — ED Provider Notes (Signed)
CSN: [id number]     Arrival date & time 07/05/13  2100 History   First MD Initiated Contact with Patient 07/05/13 2126     Chief Complaint  Patient presents with  . Dizziness  . Chest Pain     (Consider location/radiation/quality/duration/timing/severity/associated sxs/prior Treatment) The history is provided by the patient.  Andrea Cantrell is a 63 y.o. female hx of HTN, fibromyalgia, lupus, MS on tecfidera here with dizziness, chest pain. She got up yesterday and had some lightheadedness and dizziness. Felt like passing out but didn't. She had some intermittent pinching pain on the left side of her chest pain and felt that "her heart missed several beats". She had some chest pain worse with movement but not pleuritic. Intermittent shortness of breath associated with it. Denies history of PE or DVT. Took ultram with minimal relief. No cardiac history in the past.    Past Medical History  Diagnosis Date  . Hypertension   . Fibromyalgia   . Lupus   . MS (multiple sclerosis)    Past Surgical History  Procedure Laterality Date  . Abdominal hysterectomy    . Cholecystectomy    . Cesarean section    . Breast surgery Left     tissue removal   No family history on file. History  Substance Use Topics  . Smoking status: Current Every Day Smoker -- 1.00 packs/day    Types: Cigarettes  . Smokeless tobacco: Never Used  . Alcohol Use: No   OB History   Grav Para Term Preterm Abortions TAB SAB Ect Mult Living                 Review of Systems  Cardiovascular: Positive for chest pain.  Neurological: Positive for dizziness.  All other systems reviewed and are negative.     Allergies  Review of patient's allergies indicates no known allergies.  Home Medications   Prior to Admission medications   Medication Sig Start Date End Date Taking? Authorizing Provider  albuterol (PROVENTIL HFA;VENTOLIN HFA) 108 (90 BASE) MCG/ACT inhaler Inhale 2 puffs into the lungs every 6 (six)  hours as needed. For shortness of breath.    Historical Provider, MD  amLODipine (NORVASC) 5 MG tablet Take 5 mg by mouth daily.    Historical Provider, MD  aspirin EC 81 MG tablet Take 162 mg by mouth daily.    Historical Provider, MD  atorvastatin (LIPITOR) 10 MG tablet Take 10 mg by mouth daily.    Historical Provider, MD  cyanocobalamin (,VITAMIN B-12,) 1000 MCG/ML injection Inject 1,000 mcg into the muscle once.    Historical Provider, MD  cyclobenzaprine (FLEXERIL) 5 MG tablet Take 5 mg by mouth 3 (three) times daily as needed for muscle spasms.    Historical Provider, MD  Dimethyl Fumarate (TECFIDERA) 240 MG CPDR Take 1 capsule (240 mg total) by mouth 2 (two) times daily. 01/25/13   Dennie Bible, NP  gabapentin (NEURONTIN) 300 MG capsule Take 300 mg by mouth 3 (three) times daily.     Historical Provider, MD  meloxicam (MOBIC) 15 MG tablet Take 15 mg by mouth daily.    Historical Provider, MD  NORVASC 5 MG tablet  07/21/12   Historical Provider, MD  permethrin (ELIMITE) 5 % cream  05/21/12   Historical Provider, MD  traMADol (ULTRAM) 50 MG tablet Take 100 mg by mouth 2 (two) times daily.    Historical Provider, MD   BP 141/86  Pulse 71  Temp(Src) 98.4 F (36.9 C)  Resp 17  Ht 5\' 2"  (1.575 m)  Wt 214 lb (97.07 kg)  BMI 39.13 kg/m2  SpO2 99% Physical Exam  Nursing note and vitals reviewed. Constitutional: She is oriented to person, place, and time. She appears well-developed and well-nourished.  Slightly anxious   HENT:  Head: Normocephalic.  Mouth/Throat: Oropharynx is clear and moist.  Eyes: Conjunctivae are normal. Pupils are equal, round, and reactive to light.  Neck: Normal range of motion. Neck supple.  Cardiovascular: Normal rate, regular rhythm and normal heart sounds.   Pulmonary/Chest: Effort normal and breath sounds normal. No respiratory distress. She has no wheezes. She has no rales.  Reproducible L sided chest tenderness   Abdominal: Soft. Bowel sounds are  normal. She exhibits no distension. There is no tenderness. There is no rebound.  Musculoskeletal: Normal range of motion. She exhibits no edema and no tenderness.  Neurological: She is alert and oriented to person, place, and time. No cranial nerve deficit. Coordination normal.  Skin: Skin is warm and dry.  Psychiatric: She has a normal mood and affect. Her behavior is normal. Judgment and thought content normal.    ED Course  Procedures (including critical care time) Labs Review Labs Reviewed  CBC WITH DIFFERENTIAL - Abnormal; Notable for the following:    RBC 5.57 (*)    Hemoglobin 15.3 (*)    RDW 16.0 (*)    Platelets 427 (*)    All other components within normal limits  COMPREHENSIVE METABOLIC PANEL - Abnormal; Notable for the following:    Alkaline Phosphatase 140 (*)    All other components within normal limits  I-STAT TROPOININ, ED    Imaging Review Dg Chest 2 View  07/05/2013   CLINICAL DATA:  Chest pain and cough.  EXAM: CHEST  2 VIEW  COMPARISON:  PA and lateral chest 01/06/2010.  FINDINGS: The lungs are clear. Heart size is normal. No pneumothorax or pleural effusion.  IMPRESSION: Negative chest.   Electronically Signed   By: Inge Rise M.D.   On: 07/05/2013 21:51     EKG Interpretation   Date/Time:  Wednesday Jul 05 2013 21:47:43 EDT Ventricular Rate:  67 PR Interval:  152 QRS Duration: 90 QT Interval:  394 QTC Calculation: 416 R Axis:   41 Text Interpretation:  Sinus rhythm No significant change since last  tracing Confirmed by Keylani Perlstein  MD, Habiba Treloar ([id number]) on 07/05/2013 10:02:20 PM      MDM   Final diagnoses:  None   Andrea Cantrell is a 63 y.o. female here with chest pain that is reproducible and near syncope. Will get orthostatics, labs. I doubt ACS and symptoms for more than a day so trop x 1 sufficient. Will get cxr. I doubt PE and no need for D-dimer.   10:40 PM Not orthostatic. Pain improved with pain meds. Labs showed mild hemoconcentration  likely from dehydration. Given IVF. CXR and trop neg. Recommend increase tramadol for several days and outpatient f/u.     Wandra Arthurs, MD 07/05/13 2241

## 2013-07-05 NOTE — ED Notes (Signed)
Per pt report: pt c/o of dizziness and chest pain that began yesterday.  Pt reports pain was more severe yesterday.  Pt c/o of pain on her left side underneath her left breat.  Pt denies radiation of pain.  Pt reports some SOB.  No diaphoresis noted. Pt also reports her head "feels woosey like."

## 2013-07-05 NOTE — ED Notes (Signed)
EKG given to EDP,Yao,MD., for review. 

## 2013-07-26 ENCOUNTER — Encounter: Payer: Self-pay | Admitting: Neurology

## 2013-07-26 ENCOUNTER — Ambulatory Visit (INDEPENDENT_AMBULATORY_CARE_PROVIDER_SITE_OTHER): Payer: Medicare Other | Admitting: Neurology

## 2013-07-26 VITALS — BP 120/79 | HR 90 | Ht 62.5 in | Wt 215.0 lb

## 2013-07-26 DIAGNOSIS — R9409 Abnormal results of other function studies of central nervous system: Secondary | ICD-10-CM

## 2013-07-26 DIAGNOSIS — R209 Unspecified disturbances of skin sensation: Secondary | ICD-10-CM

## 2013-07-26 DIAGNOSIS — G35 Multiple sclerosis: Secondary | ICD-10-CM

## 2013-07-26 MED ORDER — BACLOFEN 10 MG PO TABS: 10.0000 mg | ORAL_TABLET | Freq: Three times a day (TID) | ORAL | Status: DC

## 2013-07-26 MED ORDER — GABAPENTIN 300 MG PO CAPS: 600.0000 mg | ORAL_CAPSULE | Freq: Three times a day (TID) | ORAL | Status: DC

## 2013-07-26 NOTE — Progress Notes (Signed)
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 She rarely has a flareup from her lupus   She presented with left side numbness in 2013. But she has episodes of gait difficulty, in 2006, took her about 6 months to recover.  From then on, she kept on having episodes of worsening gait difficulty, blurry vision. Diagnosis was made in 2013, based upon abnormal MRI.  MRI scan of the brain shows multiple periventricular lesions solitary enhancing left frontal subcortical lesion with multiple nonenhancing periventricular and subcortical hypointense lesions consistent with myelinating disease.   MRI of the cervical spine with mild degenerative changes but no enhancing lesions are noted.   She was started on tecfidera in October 2013 and has tolerated the medication extremely well. Initially she thought she developed a rash but apparently that was due to a GI virus and that has disappeared.   UPDATE June 10th 2015:  She still complains of low back, lower extremity pain, muscle spasm at her toes, she went to hospital in Jul 05 2013, complains of chest tightness, chest pain, near syncope episode, EKG was normal, chest x-ray was normal, laboratory showed normal CBC, CMP with exception of mild elevated alkaline phosphate 140, she was diagnosed with dehydration, which has improved with IV fluid, and pain medications  She now complains of left neck pain, bilateral lower extremity pain,    REVIEW OF SYSTEMS: Full 14 system review of systems performed and notable only for those listed, all others are neg:  Unexpected weight change, ringing ears, double vision, loss of vision, wheezing, shortness of breath,  palpitation heat intolerance, excessive eating, constipation, restless leg frequent waking, daytime sleepiness, joint pain, back pain, achy muscles, muscle cramps, walking difficulties, neck pain, bruise easily, memory loss, numbness, speech difficulty, weakness, confusion  ALLERGIES: No Known Allergies  HOME MEDICATIONS: Outpatient Prescriptions Prior to Visit  Medication Sig Dispense Refill  . albuterol (PROVENTIL HFA;VENTOLIN HFA) 108 (90 BASE) MCG/ACT inhaler Inhale 2 puffs into the lungs every 6 (six) hours as needed. For shortness of breath.      Marland Kitchen amLODipine (NORVASC) 5 MG tablet Take 5 mg by mouth every morning.       Marland Kitchen aspirin EC 81 MG tablet Take 162 mg by mouth daily.      Marland Kitchen atorvastatin (LIPITOR) 10 MG tablet Take 10 mg by mouth daily.      . cyanocobalamin (,VITAMIN B-12,) 1000 MCG/ML injection Inject 1,000 mcg into the muscle once.      . cyclobenzaprine (FLEXERIL) 5 MG tablet Take 5 mg by mouth 3 (three) times daily as needed for muscle spasms.      . Dimethyl Fumarate (TECFIDERA) 240 MG CPDR Take 1 capsule (240 mg total) by mouth 2 (two) times daily.  180 capsule  1  . gabapentin (NEURONTIN) 300 MG capsule Take 300 mg by mouth 3 (three) times daily.       . meloxicam (MOBIC) 15 MG tablet Take 15 mg by mouth every evening.       . traMADol (ULTRAM) 50 MG tablet Take 50-100 mg by mouth 3 (three) times daily.       . traMADol (ULTRAM) 50 MG tablet Take 2 tablets (100 mg total) by mouth 3 (  three) times daily as needed.  15 tablet  0   No facility-administered medications prior to visit.    PAST MEDICAL HISTORY: Past Medical History  Diagnosis Date  . Hypertension   . Fibromyalgia   . Lupus   . MS (multiple sclerosis)     PAST SURGICAL HISTORY: Past Surgical History  Procedure Laterality Date  . Abdominal hysterectomy    . Cholecystectomy    . Cesarean section    . Breast surgery Left     tissue removal    FAMILY HISTORY: History reviewed. No pertinent family  history.  SOCIAL HISTORY: History   Social History  . Marital Status: Legally Separated    Spouse Name: N/A    Number of Children: 2  . Years of Education: 12+   Occupational History  . Not on file.   Social History Main Topics  . Smoking status: Current Every Day Smoker -- 1.00 packs/day    Types: Cigarettes  . Smokeless tobacco: Never Used  . Alcohol Use: No  . Drug Use: No  . Sexual Activity: Not on file   Other Topics Concern  . Not on file   Social History Narrative   Patient lives at home with granddaughter her her 2 children.    Patient has 2 children.    Patient has some college.   Patient is on Disability.    Patient is separated.      PHYSICAL EXAM  Filed Vitals:   07/26/13 1146  BP: 120/79  Pulse: 90  Height: 5' 2.5" (1.588 m)  Weight: 215 lb (97.523 kg)   Body mass index is 38.67 kg/(m^2).  Generalized: Well developed, in no acute distress  Head: normocephalic and atraumatic,. Oropharynx benign  Neck: Supple, no carotid bruits  Cardiac: Regular rate rhythm, no murmur    Neurological examination   Mentation: Alert oriented to time, place, history taking. Follows all commands speech and language fluent  Cranial nerve II-XII: Fundoscopic exam reveals sharp disc margins.Pupils were equal round reactive to light extraocular movements were full, visual field were full on confrontational test. Facial sensation and strength were normal. hearing was intact to finger rubbing bilaterally. Uvula tongue midline. head turning and shoulder shrug were normal and symmetric.Tongue protrusion into cheek strength was normal. Motor: normal bulk and tone, full strength in the BUE, BLE, fine finger movements normal, no pronator drift. No focal weakness Sensory: normal and symmetric to light touch, pinprick, and  vibration  Coordination: finger-nose-finger, heel-to-shin bilaterally, no dysmetria Reflexes: Brachioradialis 2/2, biceps 2/2, triceps 2/2, patellar 2/2,  Achilles 2/2, plantar responses were flexor bilaterally. Gait and Station: Rising up from seated position without assistance, normal stance,  moderate stride, good arm swing, smooth turning, able to perform tiptoe, and heel walking without difficulty. Tandem gait is steady  DIAGNOSTIC DATA (LABS, IMAGING, TESTING) - I reviewed patient records, labs, notes, testing and imaging myself where available.  Lab Results  Component Value Date   WBC 6.7 07/05/2013   HGB 15.3* 07/05/2013   HCT 44.6 07/05/2013   MCV 80.1 07/05/2013   PLT 427* 07/05/2013      Component Value Date/Time   NA 139 07/05/2013 2130   NA 140 01/25/2013 1112   K 4.3 07/05/2013 2130   CL 99 07/05/2013 2130   CO2 25 07/05/2013 2130   GLUCOSE 96 07/05/2013 2130   GLUCOSE 94 01/25/2013 1112   BUN 14 07/05/2013 2130   BUN 14 01/25/2013 1112   CREATININE 0.63 07/05/2013 2130   CALCIUM  9.4 07/05/2013 2130   PROT 8.1 07/05/2013 2130   PROT 7.2 01/25/2013 1112   ALBUMIN 4.1 07/05/2013 2130   AST 18 07/05/2013 2130   ALT 19 07/05/2013 2130   ALKPHOS 140* 07/05/2013 2130   BILITOT 0.4 07/05/2013 2130   GFRNONAA >90 07/05/2013 2130   GFRAA >90 07/05/2013 2130     ASSESSMENT AND PLAN  63 y.o. year old female with relapsing remitting multiple sclerosis, is taking Tecfidera since Oct 2013, now complains worsening bilateral lower extremity pain, last MRI brain was in 2013, will repeat MRI brain, I will call her report, to clinic in 6 months with [name].  If MRI showed no significant change, she will continue on current medications.   Marcial Pacas, Ph.D. M.D.  [facility] [address], [place], [place] [address] [phone]

## 2013-08-02 ENCOUNTER — Telehealth: Payer: Self-pay | Admitting: Neurology

## 2013-08-02 ENCOUNTER — Ambulatory Visit
Admission: RE | Admit: 2013-08-02 | Discharge: 2013-08-02 | Disposition: A | Discharge status: Home / Self Care | Payer: Medicare Other | Source: Ambulatory Visit | Attending: Neurology | Admitting: Neurology

## 2013-08-02 DIAGNOSIS — R209 Unspecified disturbances of skin sensation: Secondary | ICD-10-CM

## 2013-08-02 DIAGNOSIS — G35 Multiple sclerosis: Secondary | ICD-10-CM

## 2013-08-02 DIAGNOSIS — R9409 Abnormal results of other function studies of central nervous system: Secondary | ICD-10-CM

## 2013-08-02 MED ORDER — GADOBENATE DIMEGLUMINE 529 MG/ML IV SOLN
20.0000 mL | Freq: Once | INTRAVENOUS | Status: AC | PRN
  Administered 2013-08-02: 20 mL via INTRAVENOUS

## 2013-08-02 MED ORDER — TRAMADOL HCL 50 MG PO TABS: 50.0000 mg | ORAL_TABLET | Freq: Three times a day (TID) | ORAL | Status: DC | PRN

## 2013-08-02 NOTE — Telephone Encounter (Signed)
I called back, got no answer.  Left message.  

## 2013-08-02 NOTE — Telephone Encounter (Signed)
Patient requesting Rx for traMADol (ULTRAM) 50 MG tablet.  Please call and advise.

## 2013-08-02 NOTE — Telephone Encounter (Signed)
Patient was prescribed Tramadol in ED.  We have never written a Rx for this med before, however, she is requesting we start filling it.  Would you like to prescribe?  Please advise.  Thank you.

## 2013-08-02 NOTE — Telephone Encounter (Signed)
Please let her know, that I have refilled her tramadol

## 2013-08-03 ENCOUNTER — Telehealth: Payer: Self-pay | Admitting: Neurology

## 2013-08-03 NOTE — Telephone Encounter (Signed)
Butch Penny: Please patient MRI brain continued evidence of multiple sclerosis, no change compared to previous study in 2013, continue current management, and followup visit  Abnormal MRI brain (with and without) demonstrating: 1. Multiple round and ovoid, periventricular, subcortical, juxtacortical  chronic demyelinating plaques. 2. Hazy T2 hyperintensities noted in the pons, may reflect chronic  demyelinating disease or chronic small vessel ischemic disease.  3. No acute plaques. 4. No change from MRI on 10/09/11.

## 2013-08-04 NOTE — Telephone Encounter (Signed)
Left message with MRI brain results, evidence of MS, no change compared to previous study in 2013, continue current management, per Dr. Krista Blue.

## 2013-09-21 ENCOUNTER — Other Ambulatory Visit: Payer: Self-pay

## 2013-09-21 MED ORDER — DIMETHYL FUMARATE 240 MG PO CPDR: 240.0000 mg | DELAYED_RELEASE_CAPSULE | Freq: Two times a day (BID) | ORAL | Status: DC

## 2013-09-22 ENCOUNTER — Telehealth: Payer: Self-pay | Admitting: Neurology

## 2013-09-22 NOTE — Telephone Encounter (Signed)
Patient was contacted by Kayla(billing) and informed that a payment would have to be made before an appt.made. She understood and said that that she will call back next week to make a payment and scheduled appt.

## 2013-09-22 NOTE — Telephone Encounter (Signed)
Gave message to [facility] because in [place] notes, patient was not scheduled due to a balance, she will contact the patient

## 2013-09-22 NOTE — Telephone Encounter (Signed)
Patient calling to state that she was supposed to come back in for a 2 month f/u after her 6/10 visit with Dr. Krista Blue but no one called her to schedule, please return call to patient and advise.

## 2013-11-04 ENCOUNTER — Emergency Department (HOSPITAL_COMMUNITY): Payer: Medicare Other

## 2013-11-04 ENCOUNTER — Encounter (HOSPITAL_COMMUNITY): Payer: Self-pay | Admitting: Emergency Medicine

## 2013-11-04 ENCOUNTER — Emergency Department (HOSPITAL_COMMUNITY)
Admission: EM | Admit: 2013-11-04 | Discharge: 2013-11-05 | Disposition: A | Discharge status: Home / Self Care | Payer: Medicare Other | Attending: Emergency Medicine | Admitting: Emergency Medicine

## 2013-11-04 DIAGNOSIS — I10 Essential (primary) hypertension: Secondary | ICD-10-CM | POA: Diagnosis not present

## 2013-11-04 DIAGNOSIS — M7730 Calcaneal spur, unspecified foot: Secondary | ICD-10-CM | POA: Insufficient documentation

## 2013-11-04 DIAGNOSIS — Z79899 Other long term (current) drug therapy: Secondary | ICD-10-CM | POA: Diagnosis not present

## 2013-11-04 DIAGNOSIS — M7731 Calcaneal spur, right foot: Secondary | ICD-10-CM

## 2013-11-04 DIAGNOSIS — F17200 Nicotine dependence, unspecified, uncomplicated: Secondary | ICD-10-CM | POA: Diagnosis not present

## 2013-11-04 DIAGNOSIS — Z8669 Personal history of other diseases of the nervous system and sense organs: Secondary | ICD-10-CM | POA: Insufficient documentation

## 2013-11-04 DIAGNOSIS — R197 Diarrhea, unspecified: Secondary | ICD-10-CM | POA: Diagnosis not present

## 2013-11-04 DIAGNOSIS — R52 Pain, unspecified: Secondary | ICD-10-CM | POA: Diagnosis not present

## 2013-11-04 DIAGNOSIS — Z872 Personal history of diseases of the skin and subcutaneous tissue: Secondary | ICD-10-CM | POA: Diagnosis not present

## 2013-11-04 DIAGNOSIS — Z7982 Long term (current) use of aspirin: Secondary | ICD-10-CM | POA: Insufficient documentation

## 2013-11-04 DIAGNOSIS — IMO0001 Reserved for inherently not codable concepts without codable children: Secondary | ICD-10-CM | POA: Diagnosis not present

## 2013-11-04 LAB — CBC WITH DIFFERENTIAL/PLATELET
Basophils Absolute: 0 10*3/uL (ref 0.0–0.1)
Basophils Relative: 0 % (ref 0–1)
EOS ABS: 0.1 10*3/uL (ref 0.0–0.7)
Eosinophils Relative: 1 % (ref 0–5)
HCT: 43.2 % (ref 36.0–46.0)
HEMOGLOBIN: 14.4 g/dL (ref 12.0–15.0)
LYMPHS ABS: 1.8 10*3/uL (ref 0.7–4.0)
Lymphocytes Relative: 24 % (ref 12–46)
MCH: 27 pg (ref 26.0–34.0)
MCHC: 33.3 g/dL (ref 30.0–36.0)
MCV: 80.9 fL (ref 78.0–100.0)
MONOS PCT: 6 % (ref 3–12)
Monocytes Absolute: 0.4 10*3/uL (ref 0.1–1.0)
Neutro Abs: 5.2 10*3/uL (ref 1.7–7.7)
Neutrophils Relative %: 69 % (ref 43–77)
Platelets: 449 10*3/uL — ABNORMAL HIGH (ref 150–400)
RBC: 5.34 MIL/uL — AB (ref 3.87–5.11)
RDW: 15.3 % (ref 11.5–15.5)
WBC: 7.5 10*3/uL (ref 4.0–10.5)

## 2013-11-04 LAB — BASIC METABOLIC PANEL
Anion gap: 14 (ref 5–15)
BUN: 11 mg/dL (ref 6–23)
CALCIUM: 9.3 mg/dL (ref 8.4–10.5)
CO2: 25 mEq/L (ref 19–32)
Chloride: 103 mEq/L (ref 96–112)
Creatinine, Ser: 0.62 mg/dL (ref 0.50–1.10)
GFR calc Af Amer: >90 mL/min (ref >90)
GFR calc non Af Amer: >90 mL/min (ref >90)
GLUCOSE: 99 mg/dL (ref 70–99)
Potassium: 4.5 mEq/L (ref 3.7–5.3)
Sodium: 142 mEq/L (ref 137–147)

## 2013-11-04 MED ORDER — TRAMADOL HCL 50 MG PO TABS
50.0000 mg | ORAL_TABLET | Freq: Once | ORAL | Status: AC
  Administered 2013-11-04: 50 mg via ORAL
  Filled 2013-11-04: qty 1

## 2013-11-04 MED ORDER — HYDROMORPHONE HCL 1 MG/ML IJ SOLN
1.0000 mg | Freq: Once | INTRAMUSCULAR | Status: AC
  Administered 2013-11-04: 1 mg via INTRAVENOUS
  Filled 2013-11-04: qty 1

## 2013-11-04 NOTE — ED Provider Notes (Signed)
CSN: [id number]     Arrival date & time 11/04/13  1908 History   First MD Initiated Contact with Patient 11/04/13 2115     Chief Complaint  Patient presents with  . Pain     (Consider location/radiation/quality/duration/timing/severity/associated sxs/prior Treatment) Patient is a 63 y.o. female presenting with lower extremity pain. The history is provided by the patient.  Foot Pain This is a new problem. The current episode started more than 1 week ago. The problem occurs daily. The problem has not changed since onset.Pertinent negatives include no chest pain, no abdominal pain, no headaches and no shortness of breath. The symptoms are aggravated by walking. Relieved by: rest. She has tried nothing for the symptoms. The treatment provided no relief.    Past Medical History  Diagnosis Date  . Hypertension   . Fibromyalgia   . Lupus   . MS (multiple sclerosis)    Past Surgical History  Procedure Laterality Date  . Abdominal hysterectomy    . Cholecystectomy    . Cesarean section    . Breast surgery Left     tissue removal   No family history on file. History  Substance Use Topics  . Smoking status: Current Every Day Smoker -- 1.00 packs/day    Types: Cigarettes  . Smokeless tobacco: Never Used  . Alcohol Use: No   OB History   Grav Para Term Preterm Abortions TAB SAB Ect Mult Living                 Review of Systems  Constitutional: Negative for fever and fatigue.  HENT: Negative for congestion and drooling.   Eyes: Negative for pain.  Respiratory: Negative for cough and shortness of breath.   Cardiovascular: Negative for chest pain.  Gastrointestinal: Positive for diarrhea. Negative for nausea, vomiting and abdominal pain.  Genitourinary: Negative for dysuria and hematuria.  Musculoskeletal: Negative for back pain, gait problem and neck pain.  Skin: Negative for color change.  Neurological: Negative for dizziness and headaches.  Hematological: Negative for  adenopathy.  Psychiatric/Behavioral: Negative for behavioral problems.  All other systems reviewed and are negative.     Allergies  Review of patient's allergies indicates no known allergies.  Home Medications   Prior to Admission medications   Medication Sig Start Date End Date Taking? Authorizing Provider  albuterol (PROVENTIL HFA;VENTOLIN HFA) 108 (90 BASE) MCG/ACT inhaler Inhale 2 puffs into the lungs every 6 (six) hours as needed. For shortness of breath.   Yes Historical Provider, MD  amLODipine (NORVASC) 5 MG tablet Take 5 mg by mouth every morning.    Yes Historical Provider, MD  aspirin EC 81 MG tablet Take 162 mg by mouth every evening.    Yes Historical Provider, MD  atorvastatin (LIPITOR) 10 MG tablet Take 10 mg by mouth daily.   Yes Historical Provider, MD  baclofen (LIORESAL) 10 MG tablet Take 10 mg by mouth 3 (three) times daily as needed for muscle spasms.   Yes Historical Provider, MD  cholecalciferol (VITAMIN D) 1000 UNITS tablet Take 1,000 Units by mouth daily.   Yes Historical Provider, MD  cyanocobalamin (,VITAMIN B-12,) 1000 MCG/ML injection Inject 1,000 mcg into the muscle once.   Yes Historical Provider, MD  Dimethyl Fumarate (TECFIDERA) 240 MG CPDR Take 240 mg by mouth every evening.   Yes Historical Provider, MD  gabapentin (NEURONTIN) 300 MG capsule Take 600 mg by mouth 2 (two) times daily.   Yes Historical Provider, MD  ibuprofen (ADVIL,MOTRIN) 200 MG tablet  Take 200-600 mg by mouth every 6 (six) hours as needed (for pain.).   Yes Historical Provider, MD  traMADol (ULTRAM) 50 MG tablet Take 1 tablet (50 mg total) by mouth every 8 (eight) hours as needed. 08/02/13  Yes Marcial Pacas, MD   BP 147/79  Pulse 78  Temp(Src) 98.5 F (36.9 C) (Oral)  Resp 16  Ht 5\' 2"  (1.575 m)  Wt 209 lb (94.802 kg)  BMI 38.22 kg/m2  SpO2 100% Physical Exam  Nursing note and vitals reviewed. Constitutional: She is oriented to person, place, and time. She appears well-developed and  well-nourished.  HENT:  Head: Normocephalic and atraumatic.  Mouth/Throat: Oropharynx is clear and moist. No oropharyngeal exudate.  Eyes: Conjunctivae and EOM are normal. Pupils are equal, round, and reactive to light.  Neck: Normal range of motion. Neck supple.  Cardiovascular: Normal rate, regular rhythm, normal heart sounds and intact distal pulses.  Exam reveals no gallop and no friction rub.   No murmur heard. Pulmonary/Chest: Effort normal and breath sounds normal. No respiratory distress. She has no wheezes.  Abdominal: Soft. Bowel sounds are normal. There is no tenderness. There is no rebound and no guarding.  Musculoskeletal: Normal range of motion. She exhibits tenderness (mild tenderness to palpation of the right heel.). She exhibits no edema.  Neurological: She is alert and oriented to person, place, and time.  Skin: Skin is warm and dry.  Psychiatric: She has a normal mood and affect. Her behavior is normal.    ED Course  Procedures (including critical care time) Labs Review Labs Reviewed  CBC WITH DIFFERENTIAL - Abnormal; Notable for the following:    RBC 5.34 (*)    Platelets 449 (*)    All other components within normal limits  BASIC METABOLIC PANEL    Imaging Review Dg Foot Complete Right  11/04/2013   CLINICAL DATA:  Right foot pain, at the calcaneus.  EXAM: RIGHT FOOT COMPLETE - 3+ VIEW  COMPARISON:  None.  FINDINGS: There is no evidence of fracture or dislocation. The joint spaces are preserved. There is no evidence of talar subluxation; the subtalar joint is unremarkable in appearance. A small plantar calcaneal spur is incidentally seen.  No significant soft tissue abnormalities are seen.  IMPRESSION: 1. No evidence of fracture or dislocation. 2. Small plantar calcaneal spur incidentally seen.   Electronically Signed   By: Garald Balding M.D.   On: 11/04/2013 23:49     EKG Interpretation None      MDM   Final diagnoses:  Heel spur, right  Total body pain     9:49 PM 63 y.o. female w a hx of MS, lupus, fibromyalgia who presents with pain all over. She states that she has had diffuse pain over her body for the last several weeks which has worsened over the last few days since she has ran out of her tramadol prescription. She denies any fevers, vomiting, chest pain, or shortness of breath. She has had some mild diarrhea. She is afebrile and vital signs are unremarkable here. She notes that she has a burning in her elbows bilaterally. Will get screening labs and pain control. She also complains of some right heel pain but denies injury.  12:06 AM: Pt feeling much better after pain control. I reviewed her labwork which is noncontributory. Plain film of the right foot shows a heel spur. Will recommend orthotics and followup with podiatry for this. Her total body pain is likely related to her fibromyalgia versus lupus.  Will refill her prescription of tramadol and she is out of this medicine. I have discussed the diagnosis/risks/treatment options with the patient and believe the pt to be eligible for discharge home to follow-up with her pcp next week. We also discussed returning to the ED immediately if new or worsening sx occur. We discussed the sx which are most concerning (e.g., worsening pain, fever, vomiting) that necessitate immediate return. Medications administered to the patient during their visit and any new prescriptions provided to the patient are listed below.  Medications given during this visit Medications  HYDROmorphone (DILAUDID) injection 0.5 mg (not administered)  HYDROmorphone (DILAUDID) injection 1 mg (1 mg Intravenous Given 11/04/13 2212)  traMADol (ULTRAM) tablet 50 mg (50 mg Oral Given 11/04/13 2343)    New Prescriptions   TRAMADOL (ULTRAM) 50 MG TABLET    Take 1 tablet (50 mg total) by mouth every 6 (six) hours as needed.       Pamella Pert, MD 11/05/13 [phone]

## 2013-11-04 NOTE — ED Notes (Signed)
Pt c/o generalized body pain, more so in extremities. Pt feels pain is related to Fibromyalgia and Lupus. Pt states she is out of her Tramadol for a few days and has not gotten back to her PCP

## 2013-11-05 MED ORDER — TRAMADOL HCL 50 MG PO TABS: 50.0000 mg | ORAL_TABLET | Freq: Four times a day (QID) | ORAL | Status: DC | PRN

## 2013-11-05 MED ORDER — HYDROMORPHONE HCL 1 MG/ML IJ SOLN
0.5000 mg | Freq: Once | INTRAMUSCULAR | Status: AC
  Administered 2013-11-05: 0.5 mg via INTRAVENOUS
  Filled 2013-11-05: qty 1

## 2013-11-16 HISTORY — PX: NM MYOVIEW LTD: HXRAD82

## 2013-11-20 ENCOUNTER — Ambulatory Visit (INDEPENDENT_AMBULATORY_CARE_PROVIDER_SITE_OTHER): Payer: Medicare Other | Admitting: Cardiology

## 2013-11-20 ENCOUNTER — Encounter: Payer: Self-pay | Admitting: Cardiology

## 2013-11-20 VITALS — BP 106/72 | HR 74 | Ht 62.0 in | Wt 214.4 lb

## 2013-11-20 DIAGNOSIS — I10 Essential (primary) hypertension: Secondary | ICD-10-CM

## 2013-11-20 DIAGNOSIS — R0789 Other chest pain: Secondary | ICD-10-CM | POA: Insufficient documentation

## 2013-11-20 DIAGNOSIS — E785 Hyperlipidemia, unspecified: Secondary | ICD-10-CM | POA: Insufficient documentation

## 2013-11-20 DIAGNOSIS — R079 Chest pain, unspecified: Secondary | ICD-10-CM

## 2013-11-20 DIAGNOSIS — R0609 Other forms of dyspnea: Secondary | ICD-10-CM | POA: Insufficient documentation

## 2013-11-20 DIAGNOSIS — E669 Obesity, unspecified: Secondary | ICD-10-CM

## 2013-11-20 NOTE — Assessment & Plan Note (Signed)
At this point she is scared to really get too high to be involved in starting to exercise until she result of her stress test.

## 2013-11-20 NOTE — Assessment & Plan Note (Signed)
Cardiac risk factor modification. Evaluate with Myoview stress test. Despite the fact that she says that she thinks she can walk a treadmill, I'm not sure she will be the target heart rate. I will order this is treadmill of the possibility of converting to a LexiScan.

## 2013-11-20 NOTE — Assessment & Plan Note (Signed)
Excellent control today. She is on ACE inhibitor and beta blocker

## 2013-11-20 NOTE — Patient Instructions (Addendum)
Your physician has requested that you have en exercise stress myoview.  For further information please visit [email or web address]. Please follow instruction sheet, as given.   Your physician wants you to follow-up in 1 month Dr Melody Haver will receive a reminder letter in the mail two months in advance. If you don't receive a letter, please call our office to schedule the follow-up appointment.

## 2013-11-20 NOTE — Assessment & Plan Note (Signed)
Monitor PCP. I have the results available was checked. She is on a statin, fenofibrate as well as Zetia. Clearly she has been difficult to control.

## 2013-11-20 NOTE — Assessment & Plan Note (Signed)
This will be evaluated with Myoview. To be multifactorial with obesity.

## 2013-11-20 NOTE — Progress Notes (Signed)
PATIENT: Andrea Cantrell MRN: [id number] DOB: [birth year] PCP: Pcp Not In System  Clinic Note: Chief Complaint  Patient presents with  . New Evaluation    referred by Dr. Jimmye Norman; chest pains like spikes and heart turning over, shortness of breath on exertion; no other complaiunts    HPI: Andrea Cantrell is a 63 y.o. female with a PMH below who presents today for a cardiac consultation to evaluate left-sided chest discomfort and exertional dyspnea. She has a history of discoid lupus, fibromyalgia, hypertension and hyperlipidemia. She bases chronic pain that has gotten much better on Cymbalta..  She is referred for several episodes of [place] "spiking chest pain "on the left side of her chest underneath her breasts that comes and goes. There is no real correlation with any particular activity but has noted with exertion as well as at rest. Episodes last several minutes. Not necessarily associated with any palpitations or rapid/irregular heartbeats. She does feel occasional "flip flopping in her heart but no rapid beats. She does have some dyspnea associated with these episodes, but more notably has exertional dyspnea with moderate exertion. She admittedly is not very active and has not done well with her weight.  The chest discomfort is made worse with lying on her left side but is slightly different nature of pain in that setting. Cardiovascular ROS: positive for - chest pain, dyspnea on exertion, edema and irregular heartbeat negative for - loss of consciousness, murmur, orthopnea, paroxysmal nocturnal dyspnea, rapid heart rate, shortness of breath or Syncope/near syncope, TIA/amaurosis fugax. : Additional cardiac review of systems:  Past Medical History  Diagnosis Date  . Essential hypertension   . Fibromyalgia   . Discoid lupus   . MS (multiple sclerosis)   . Hyperlipidemia with target LDL less than 130   . Obesity (BMI 35.0-39.9 without comorbidity)   . Heavy smoker (more than 20  cigarettes per day)     Was able to stop for 11 years but restarted about 10 years ago    Prior Cardiac Evaluation and Past Surgical History: Past Surgical History  Procedure Laterality Date  . Abdominal hysterectomy  1995  . Cholecystectomy  1994  . Cesarean section  [place]  . Breast surgery Left     tissue removal    No Known Allergies  Current Outpatient Prescriptions  Medication Sig Dispense Refill  . albuterol (PROVENTIL HFA;VENTOLIN HFA) 108 (90 BASE) MCG/ACT inhaler Inhale 2 puffs into the lungs every 6 (six) hours as needed. For shortness of breath.      Marland Kitchen amLODipine (NORVASC) 5 MG tablet Take 5 mg by mouth every morning.       Marland Kitchen aspirin EC 81 MG tablet Take 162 mg by mouth every evening.       Marland Kitchen atorvastatin (LIPITOR) 10 MG tablet Take 10 mg by mouth daily.      . baclofen (LIORESAL) 10 MG tablet Take 10 mg by mouth 3 (three) times daily as needed for muscle spasms.      . cholecalciferol (VITAMIN D) 1000 UNITS tablet Take 1,000 Units by mouth daily.      . cyanocobalamin (,VITAMIN B-12,) 1000 MCG/ML injection Inject 1,000 mcg into the muscle once.      . Dimethyl Fumarate (TECFIDERA) 240 MG CPDR Take 240 mg by mouth every evening.      . DULoxetine (CYMBALTA) 30 MG capsule Take 1 capsule by mouth daily.      Marland Kitchen gabapentin (NEURONTIN) 300 MG capsule Take 600 mg  by mouth 2 (two) times daily.      Marland Kitchen ibuprofen (ADVIL,MOTRIN) 200 MG tablet Take 200-600 mg by mouth every 6 (six) hours as needed (for pain.).      Marland Kitchen traMADol (ULTRAM) 50 MG tablet Take 1 tablet (50 mg total) by mouth every 6 (six) hours as needed.  25 tablet  0   No current facility-administered medications for this visit.    History   Social History Narrative   Patient lives at home with granddaughter her her 2 children.    Patient is separated. Patient has 2 children.    Patient has some college. Patient is on Disability.    Smokes roughly one pack a day. No alcohol.    family history includes Cancer  in her father and mother; Diabetes in her brother and maternal grandmother.  ROS: A comprehensive Review of Systems -  Review of Systems  Constitutional: Negative for fever, chills, weight loss and malaise/fatigue.  HENT: Negative for congestion and nosebleeds.        Intermittent headaches; As her loss from discoid lupus -- wears a wig  Eyes: Negative for blurred vision.       With headaches  Respiratory: Positive for shortness of breath. Negative for cough, hemoptysis, sputum production and wheezing.   Cardiovascular: Positive for chest pain and palpitations. Negative for orthopnea, claudication and PND.       Per history of present illness  Gastrointestinal: Negative for blood in stool and melena.  Genitourinary: Negative for hematuria.  Musculoskeletal: Positive for back pain, joint pain, myalgias and neck pain.       Back chest and leg pain from around her fibromyalgia  Neurological: Positive for dizziness, tingling and headaches. Negative for tremors, sensory change, speech change, focal weakness, seizures and loss of consciousness.  Endo/Heme/Allergies: Does not bruise/bleed easily.  Psychiatric/Behavioral: Negative for depression, suicidal ideas, hallucinations, memory loss and substance abuse. The patient is nervous/anxious. The patient does not have insomnia.   All other systems reviewed and are negative.   PHYSICAL EXAM BP 106/72  Pulse 74  Ht 5\' 2"  (1.575 m)  Wt 214 lb 6.4 oz (97.251 kg)  BMI 39.20 kg/m2 General appearance: alert, cooperative, appears stated age, moderately obese and Pleasant mood and affect. Neck: no adenopathy, no carotid bruit, no JVD, supple, symmetrical, trachea midline and thyroid not enlarged, symmetric, no tenderness/mass/nodules Lungs: clear to auscultation bilaterally, normal percussion bilaterally and Nonlabored, good air movement Heart: regular rate and rhythm, S1, S2 normal, no murmur, click, rub or gallop and normal apical impulse Abdomen:  soft, non-tender; bowel sounds normal; no masses,  no organomegaly and Obese Extremities: extremities normal, atraumatic, no cyanosis or edema and no ulcers, gangrene or trophic changes Pulses: 2+ and symmetric Neurologic: Grossly normal   Adult ECG Report  Rate:  74 ;  Rhythm: normal sinus rhythm  Narrative Interpretation:  normal EKG  Recent Labs:  CMP     Component Value Date/Time   NA 142 11/04/2013 2159   NA 140 01/25/2013 1112   K 4.5 11/04/2013 2159   CL 103 11/04/2013 2159   CO2 25 11/04/2013 2159   GLUCOSE 99 11/04/2013 2159   GLUCOSE 94 01/25/2013 1112   BUN 11 11/04/2013 2159   BUN 14 01/25/2013 1112   CREATININE 0.62 11/04/2013 2159   CALCIUM 9.3 11/04/2013 2159   PROT 8.1 07/05/2013 2130   PROT 7.2 01/25/2013 1112   ALBUMIN 4.1 07/05/2013 2130   AST 18 07/05/2013 2130   ALT 19  07/05/2013 2130   ALKPHOS 140* 07/05/2013 2130   BILITOT 0.4 07/05/2013 2130   GFRNONAA >90 11/04/2013 2159   GFRAA >90 11/04/2013 2159    CBC Latest Ref Rng 11/04/2013 07/05/2013 01/25/2013  WBC 4.0 - 10.5 K/uL 7.5 6.7 6.6  Hemoglobin 12.0 - 15.0 g/dL 14.4 15.3(H) 14.1  Hematocrit 36.0 - 46.0 % 43.2 44.6 40.6  Platelets 150 - 400 K/uL 449(H) 427(H) -    ASSESSMENT / PLAN: She is a 63 year old woman with cardiac risk factors of hypertension, hyperlipidemia and type 2 diabetes on insulin as well as obesity who is being referred for evaluation of atypical sounding chest discomfort in his description as well as duration. There is also the question of whether or not it is made worse with exertion. However with her significant risk factors and age is at least at moderate risk for this being potentially cardiac in nature. There was question in the past of possible murmur and I do not hear a murmur.   Chest pain with moderate risk for cardiac etiology Cardiac risk factor modification. Evaluate with Myoview stress test. Despite the fact that she says that she thinks she can walk a treadmill, I'm not sure she  will be the target heart rate. I will order this is treadmill of the possibility of converting to a LexiScan.   DOE (dyspnea on exertion) This will be evaluated with Myoview. To be multifactorial with obesity.  Hyperlipidemia with target LDL less than 130 Monitor PCP. I have the results available was checked. She is on a statin, fenofibrate as well as Zetia. Clearly she has been difficult to control.  Essential hypertension Excellent control today. She is on ACE inhibitor and beta blocker  Obesity (BMI 30-39.9) At this point she is scared to really get too high to be involved in starting to exercise until she result of her stress test.    Orders Placed This Encounter  Procedures  . Myocardial Perfusion Imaging    Standing Status: Future     Number of Occurrences:      Standing Expiration Date: 11/20/2014    Order Specific Question:  Where should this test be performed    Answer:  [facility]    Order Specific Question:  Type of stress    Answer:  Exercise    Order Specific Question:  Patient weight in lbs    Answer:  214  . EKG 12-Lead   Meds ordered this encounter  Medications  . DULoxetine (CYMBALTA) 30 MG capsule    Sig: Take 1 capsule by mouth daily.    Followup: ~ 1 months  DAVID W. Ellyn Hack, M.D., M.S. Interventional Cardiolgy [facility]

## 2013-11-23 ENCOUNTER — Telehealth (HOSPITAL_COMMUNITY): Payer: Self-pay

## 2013-11-23 NOTE — Telephone Encounter (Signed)
Encounter complete. 

## 2013-11-28 ENCOUNTER — Ambulatory Visit (HOSPITAL_COMMUNITY)
Admission: RE | Admit: 2013-11-28 | Discharge: 2013-11-28 | Disposition: A | Discharge status: Home / Self Care | Payer: Medicare Other | Source: Ambulatory Visit | Attending: Cardiology | Admitting: Cardiology

## 2013-11-28 DIAGNOSIS — Z8249 Family history of ischemic heart disease and other diseases of the circulatory system: Secondary | ICD-10-CM | POA: Diagnosis not present

## 2013-11-28 DIAGNOSIS — R0609 Other forms of dyspnea: Secondary | ICD-10-CM

## 2013-11-28 DIAGNOSIS — I10 Essential (primary) hypertension: Secondary | ICD-10-CM | POA: Diagnosis not present

## 2013-11-28 DIAGNOSIS — R002 Palpitations: Secondary | ICD-10-CM | POA: Diagnosis not present

## 2013-11-28 DIAGNOSIS — E785 Hyperlipidemia, unspecified: Secondary | ICD-10-CM | POA: Diagnosis not present

## 2013-11-28 DIAGNOSIS — R0600 Dyspnea, unspecified: Secondary | ICD-10-CM | POA: Insufficient documentation

## 2013-11-28 DIAGNOSIS — F17210 Nicotine dependence, cigarettes, uncomplicated: Secondary | ICD-10-CM | POA: Diagnosis not present

## 2013-11-28 DIAGNOSIS — R079 Chest pain, unspecified: Secondary | ICD-10-CM | POA: Insufficient documentation

## 2013-11-28 MED ORDER — TECHNETIUM TC 99M SESTAMIBI GENERIC - CARDIOLITE
29.8000 | Freq: Once | INTRAVENOUS | Status: AC | PRN
  Administered 2013-11-28: 29.8 via INTRAVENOUS

## 2013-11-28 MED ORDER — TECHNETIUM TC 99M SESTAMIBI GENERIC - CARDIOLITE
10.6000 | Freq: Once | INTRAVENOUS | Status: AC | PRN
  Administered 2013-11-28: 11 via INTRAVENOUS

## 2013-11-28 MED ORDER — REGADENOSON 0.4 MG/5ML IV SOLN
0.4000 mg | Freq: Once | INTRAVENOUS | Status: AC
Start: 2013-11-28 — End: 2013-11-28
  Administered 2013-11-28: 0.4 mg via INTRAVENOUS

## 2013-11-28 NOTE — Procedures (Addendum)
[facility] [address] [name] [address] [phone]  Cardiology Nuclear Med Study  Andrea Cantrell is a 63 y.o. female     MRN : [id number]     DOB: September 17, 1950  Procedure Date: 11/28/2013  Nuclear Med Background Indication for Stress Test:  Evaluation for Ischemia History:  Discoid Lupus;MS;No prior NUC MPI for comparison;No prior cardiac or respiratory history reported. Cardiac Risk Factors: Family History - CAD, Hypertension, Lipids, Obesity, Smoker and Edema  Symptoms:  Chest Pain, DOE, Fatigue and Palpitations   Nuclear Pre-Procedure Caffeine/Decaff Intake:  1:00am NPO After: 11am   IV Site: R Forearm  IV 0.9% NS with Angio Cath:  22g  Chest Size (in):  n/a IV Started by: Rolene Course, RN  Height: 5\' 2"  (1.575 m)  Cup Size: D  BMI:  Body mass index is 39.13 kg/(m^2). Weight:  214 lb (97.07 kg)   Tech Comments:  n/a    Nuclear Med Study 1 or 2 day study: 1 day  Stress Test Type:  [place] Provider:  Glenetta Hew, MD   Resting Radionuclide: Technetium 37m Sestamibi  Resting Radionuclide Dose: 10.6 mCi   Stress Radionuclide:  Technetium 30m Sestamibi  Stress Radionuclide Dose: 29.8 mCi           Stress Protocol Rest HR: 60 Stress HR: 98  Rest BP: 144/79 Stress BP: 158/81  Exercise Time (min): n/a METS: n/a   Predicted Max HR: 156 bpm % Max HR: 62.82 bpm Rate Pressure Product: 15484  Dose of Adenosine (mg):  n/a Dose of Lexiscan: 0.4 mg  Dose of Atropine (mg): n/a Dose of Dobutamine: n/a mcg/kg/min (at max HR)  Stress Test Technologist: Leane Para, CCT Nuclear Technologist: Imagene Riches, CNMT   Rest Procedure:  Myocardial perfusion imaging was performed at rest 45 minutes following the intravenous administration of Technetium 16m Sestamibi. Stress Procedure:  The patient received IV Lexiscan 0.4 mg over 15-seconds.  Technetium 22m Sestamibi injected at 30-seconds.  There were  no significant changes with Lexiscan.  Quantitative spect images were obtained after a 45 minute delay.  Transient Ischemic Dilatation (Normal <1.22):  1.12  QGS EDV:  64 ml QGS ESV:  18 ml LV Ejection Fraction: 71%   PHYSICIAN INTERPRETATION  Rest ECG: NSR - Normal EKG  Stress ECG: No significant change from baseline ECG and No significant ST segment change suggestive of ischemia.  QPS Raw Data Images:  Acquisition technically good; normal left ventricular size. Stress Images:  Normal homogeneous uptake in all areas of the myocardium. Rest Images:  Normal homogeneous uptake in all areas of the myocardium. Subtraction (SDS):  There is no evidence of scar or ischemia. LV Wall Motion:  NL LV Function; NL Wall Motion   Impression Exercise Capacity:  Lexiscan with no exercise. BP Response:  Normal blood pressure response. Clinical Symptoms:  No significant symptoms noted. ECG Impression:  No significant ECG changes with Lexiscan. Comparison with Prior Nuclear Study: No images to compare  Overall Impression:  Normal stress nuclear study. and Low risk stress nuclear study with no evidence of Ischemia or Infarction.. Normal LV Function    Ellora Varnum W, MD  11/28/2013 5:11 PM

## 2013-11-30 ENCOUNTER — Telehealth: Payer: Self-pay | Admitting: *Deleted

## 2013-11-30 NOTE — Telephone Encounter (Signed)
Message copied by Raiford Simmonds on Thu Nov 30, 2013  8:56 AM ------      Message from: Leonie Man      Created: Wed Nov 29, 2013  6:42 PM       Low risk stress test finding. Normal cardiac function with no evidence of decreased blood flow to explain chest pain, and no evidence to suggest prior heart attack.Doristine Devoid news.            Leonie Man, MD       ------

## 2013-11-30 NOTE — Telephone Encounter (Signed)
Left message -results are good. Release on [facility]

## 2013-12-28 ENCOUNTER — Ambulatory Visit: Payer: Medicare Other | Admitting: Cardiology

## 2014-02-11 ENCOUNTER — Encounter (HOSPITAL_COMMUNITY): Payer: Self-pay | Admitting: Emergency Medicine

## 2014-02-11 ENCOUNTER — Emergency Department (HOSPITAL_COMMUNITY)
Admission: EM | Admit: 2014-02-11 | Discharge: 2014-02-11 | Disposition: A | Discharge status: Home / Self Care | Payer: Medicare Other | Attending: Emergency Medicine | Admitting: Emergency Medicine

## 2014-02-11 DIAGNOSIS — E785 Hyperlipidemia, unspecified: Secondary | ICD-10-CM | POA: Insufficient documentation

## 2014-02-11 DIAGNOSIS — Z872 Personal history of diseases of the skin and subcutaneous tissue: Secondary | ICD-10-CM | POA: Insufficient documentation

## 2014-02-11 DIAGNOSIS — E669 Obesity, unspecified: Secondary | ICD-10-CM | POA: Diagnosis not present

## 2014-02-11 DIAGNOSIS — M62830 Muscle spasm of back: Secondary | ICD-10-CM | POA: Diagnosis present

## 2014-02-11 DIAGNOSIS — Z8669 Personal history of other diseases of the nervous system and sense organs: Secondary | ICD-10-CM | POA: Diagnosis not present

## 2014-02-11 DIAGNOSIS — Z720 Tobacco use: Secondary | ICD-10-CM | POA: Insufficient documentation

## 2014-02-11 DIAGNOSIS — Z7982 Long term (current) use of aspirin: Secondary | ICD-10-CM | POA: Insufficient documentation

## 2014-02-11 DIAGNOSIS — Z79899 Other long term (current) drug therapy: Secondary | ICD-10-CM | POA: Insufficient documentation

## 2014-02-11 DIAGNOSIS — M797 Fibromyalgia: Secondary | ICD-10-CM | POA: Insufficient documentation

## 2014-02-11 DIAGNOSIS — I10 Essential (primary) hypertension: Secondary | ICD-10-CM | POA: Insufficient documentation

## 2014-02-11 MED ORDER — HYDROCODONE-ACETAMINOPHEN 5-325 MG PO TABS: 1.0000 | ORAL_TABLET | ORAL | Status: DC | PRN

## 2014-02-11 MED ORDER — HYDROCODONE-ACETAMINOPHEN 5-325 MG PO TABS
1.0000 | ORAL_TABLET | Freq: Once | ORAL | Status: AC
  Administered 2014-02-11: 1 via ORAL
  Filled 2014-02-11: qty 1

## 2014-02-11 NOTE — ED Provider Notes (Signed)
CSN: [id number]     Arrival date & time 02/11/14  1944 History  This chart was scribed for non-physician practitioner Charlann Lange, PA-C working with Dorie Rank, MD by Hilda Lias, ED Scribe. This patient was seen in room [facility] [facility] and the patient's care was started at 8:30 PM.    Chief Complaint  Patient presents with  . Spasms   The history is provided by the patient. No language interpreter was used.     HPI Comments: Andrea Cantrell is a 63 y.o. female with MS, Fibromyalgia, and Discoid Lupus who presents to the Emergency Department complaining of intermittent, sharp, grabbing left-sided lower back pain that radiates down her left leg to her toes that has been present for 2 days. Pt states that movement causes her pain, and notes that laying down or not moving makes her pain better. Pt states that reaching out to grab something or walking up stairs can trigger her pain. Pt is on disability and has been since 2006. Pt denies numbness, weakness, or abdominal pain.                                                                Past Medical History  Diagnosis Date  . Essential hypertension   . Fibromyalgia   . Discoid lupus   . MS (multiple sclerosis)   . Hyperlipidemia with target LDL less than 130   . Obesity (BMI 35.0-39.9 without comorbidity)   . Heavy smoker (more than 20 cigarettes per day)     Was able to stop for 11 years but restarted about 10 years ago   Past Surgical History  Procedure Laterality Date  . Abdominal hysterectomy  1995  . Cholecystectomy  1994  . Cesarean section  [place]  . Breast surgery Left     tissue removal   Family History  Problem Relation Age of Onset  . Cancer Mother   . Cancer Father   . Diabetes Brother   . Diabetes Maternal Grandmother    History  Substance Use Topics  . Smoking status: Current Every Day Smoker -- 1.00 packs/day    Types: Cigarettes  . Smokeless tobacco: Never Used  . Alcohol Use: No   OB History    No  data available     Review of Systems  Gastrointestinal: Negative for abdominal pain.  Musculoskeletal: Positive for myalgias and back pain.  Neurological: Negative for weakness and numbness.      Allergies  Review of patient's allergies indicates no known allergies.  Home Medications   Prior to Admission medications   Medication Sig Start Date End Date Taking? Authorizing Provider  albuterol (PROVENTIL HFA;VENTOLIN HFA) 108 (90 BASE) MCG/ACT inhaler Inhale 2 puffs into the lungs every 6 (six) hours as needed. For shortness of breath.    Historical Provider, MD  amLODipine (NORVASC) 5 MG tablet Take 5 mg by mouth every morning.     Historical Provider, MD  aspirin EC 81 MG tablet Take 162 mg by mouth every evening.     Historical Provider, MD  atorvastatin (LIPITOR) 10 MG tablet Take 10 mg by mouth daily.    Historical Provider, MD  baclofen (LIORESAL) 10 MG tablet Take 10 mg by mouth 3 (three) times daily as needed for muscle  spasms.    Historical Provider, MD  cholecalciferol (VITAMIN D) 1000 UNITS tablet Take 1,000 Units by mouth daily.    Historical Provider, MD  cyanocobalamin (,VITAMIN B-12,) 1000 MCG/ML injection Inject 1,000 mcg into the muscle once.    Historical Provider, MD  Dimethyl Fumarate (TECFIDERA) 240 MG CPDR Take 240 mg by mouth every evening.    Historical Provider, MD  DULoxetine (CYMBALTA) 30 MG capsule Take 1 capsule by mouth daily. 11/08/13   Historical Provider, MD  gabapentin (NEURONTIN) 300 MG capsule Take 600 mg by mouth 2 (two) times daily.    Historical Provider, MD  ibuprofen (ADVIL,MOTRIN) 200 MG tablet Take 200-600 mg by mouth every 6 (six) hours as needed (for pain.).    Historical Provider, MD  traMADol (ULTRAM) 50 MG tablet Take 1 tablet (50 mg total) by mouth every 6 (six) hours as needed. 11/05/13   Pamella Pert, MD   BP 160/70 mmHg  Pulse 76  Temp(Src) 97.6 F (36.4 C) (Oral)  Resp 18  SpO2 96% Physical Exam  Constitutional: She is  oriented to person, place, and time. She appears well-developed and well-nourished.  HENT:  Head: Normocephalic and atraumatic.  Cardiovascular: Normal rate.   Pulmonary/Chest: Effort normal.  Abdominal: She exhibits no distension.  Musculoskeletal:  Left lower back muscular tenderness that extends into the left buttock No palpable spasm Full ROM of lower extremities   Neurological: She is alert and oriented to person, place, and time.  Equal reflexes in lower extremities Ambulatory without ataxia   Skin: Skin is warm and dry.  Psychiatric: She has a normal mood and affect.  Nursing note and vitals reviewed.   ED Course  Procedures (including critical care time)  DIAGNOSTIC STUDIES: Oxygen Saturation is 96% on RA, normal by my interpretation.    COORDINATION OF CARE: 8:36 PM Discussed treatment plan with pt at bedside and pt agreed to plan.   Labs Review Labs Reviewed - No data to display  Imaging Review No results found.   EKG Interpretation None      MDM   Final diagnoses:  None    1. Muscular spasm 2. Back pain  Symptoms and exam support muscular cause of symptoms. No neurologic deficits. VSS. Discussed with Dr. Tomi Bamberger. She is stable for discharge home.   I personally performed the services described in this documentation, which was scribed in my presence. The recorded information has been reviewed and is accurate    Dewaine Oats, PA-C 02/12/14 0132  Dorie Rank, MD 02/13/14 1036

## 2014-02-11 NOTE — Discharge Instructions (Signed)

## 2014-02-11 NOTE — ED Notes (Signed)
Pt reports that she was standing on her feet all day 12/25 and began having L sided back pain. By that night pt began having spasms down her L hip and leg. Pt a&ox4, skin warm and dry, ambulating with slow gait.

## 2014-02-14 ENCOUNTER — Telehealth: Payer: Self-pay | Admitting: Neurology

## 2014-02-14 NOTE — Telephone Encounter (Signed)
Pt had to go to the ER on 12/27 and was told to follow up with Dr. Krista Blue.  The first ava appointment I had was in February.  Pt states she needs to be seen sooner.  Please call and advise.

## 2014-02-14 NOTE — Telephone Encounter (Signed)
Called patient and spoke with I think she needs to speak to [place] in Billing. Angie will you check account and see if patient can schedule follow.

## 2014-02-15 NOTE — Telephone Encounter (Signed)
Hinton Dyer - I will call the patient and let her know her has an account balance of $84.36.Marland Kitchen Thanks Angie

## 2014-03-26 ENCOUNTER — Telehealth: Payer: Self-pay | Admitting: Neurology

## 2014-03-26 ENCOUNTER — Ambulatory Visit (INDEPENDENT_AMBULATORY_CARE_PROVIDER_SITE_OTHER): Payer: Medicare Other | Admitting: Neurology

## 2014-03-26 ENCOUNTER — Encounter: Payer: Self-pay | Admitting: Neurology

## 2014-03-26 VITALS — BP 127/81 | HR 80 | Ht 62.0 in | Wt 220.0 lb

## 2014-03-26 DIAGNOSIS — M5442 Lumbago with sciatica, left side: Secondary | ICD-10-CM | POA: Insufficient documentation

## 2014-03-26 DIAGNOSIS — G35 Multiple sclerosis: Secondary | ICD-10-CM

## 2014-03-26 MED ORDER — DULOXETINE HCL 60 MG PO CPEP: 60.0000 mg | ORAL_CAPSULE | Freq: Every day | ORAL | Status: DC

## 2014-03-26 MED ORDER — TRAMADOL HCL 50 MG PO TABS: 50.0000 mg | ORAL_TABLET | Freq: Four times a day (QID) | ORAL | Status: DC | PRN

## 2014-03-26 MED ORDER — GABAPENTIN 300 MG PO CAPS: 600.0000 mg | ORAL_CAPSULE | Freq: Three times a day (TID) | ORAL | Status: DC

## 2014-03-26 NOTE — Telephone Encounter (Signed)
I called the patient back.  Said she was sent forms to complete regarding her finances.  She is to complete these and return them to the sender.  She is uncertain where they came from, but thinks it was [employer].  I asked if this was who she has Prescription Ins Coverage through, and she said she used to, but thinks it may have changed.  She does not have any new info.  I called [employer] at [phone] to see if they need anything from [place] for this patient.  Spoke with [employer].  She said they do not need anything from [place].

## 2014-03-26 NOTE — Telephone Encounter (Signed)
Andrea Cantrell:  Please help checking on her [employer] process. She send in the paper work in Feb, 2016,

## 2014-03-26 NOTE — Progress Notes (Signed)
[facility]  PATIENT: Andrea Cantrell DOB: [birth year]  Andrea Cantrell, is a 64 year old female returns for followup of  relapsing remitting multiple sclerosis   She has a history of multiple sclerosis since 1990s, also with past medical history of hypertension, hyperlipidemia, fibromyalgia, and discoid lupus in the 1990s. She rarely has a flareup from her lupus   She has episodes of gait difficulty, in 2006, took her about 6 months to recover.  From then on, she kept on having episodes of worsening gait difficulty, blurry vision. Diagnosis was made in 2013, based upon abnormal MRI.  MRI scan of the brain shows multiple periventricular lesions solitary enhancing left frontal subcortical lesion with multiple nonenhancing periventricular and subcortical hypointense lesions consistent with myelinating disease.   MRI of the cervical spine with mild degenerative changes but no enhancing lesions are noted.   She was started on tecfidera in October 2013 and has tolerated the medication extremely well. Initially she thought she developed a rash but apparently that was due to a GI virus and that has disappeared.   UPDATE June 10th 2015:  She still complains of low back, lower extremity pain, muscle spasm at her toes, she went to hospital in Jul 05 2013, complains of chest tightness, chest pain, near syncope episode, EKG was normal, chest x-ray was normal, laboratory showed normal CBC, CMP with exception of mild elevated alkaline phosphate 140, she was diagnosed with dehydration, which has improved with IV fluid, and pain medications, normal nuclear stress test in October 2015  She now complains of left neck pain, bilateral lower extremity pain  UPDATE Feb 8th 2016: She now complains of right knee pain, right foot numbness since  Mar 18 2014, going up her right leg, when she put pressure on her right foot, she noticed right toe pain, going up her right leg,  She also complains of low  back pain, going down her left hip and left leg, she also has right arm intermittent sharp pain.  She went to ED in Dec 27th 2015 for worsening left side low back pain, shooting pain to left leg.   REVIEW OF SYSTEMS: Full 14 system review of systems performed and notable only for those listed, all others are neg:    ALLERGIES: No Known Allergies  HOME MEDICATIONS: Outpatient Prescriptions Prior to Visit  Medication Sig Dispense Refill  . albuterol (PROVENTIL HFA;VENTOLIN HFA) 108 (90 BASE) MCG/ACT inhaler Inhale 2 puffs into the lungs every 6 (six) hours as needed. For shortness of breath.    Marland Kitchen amLODipine (NORVASC) 5 MG tablet Take 5 mg by mouth every morning.     Marland Kitchen aspirin EC 81 MG tablet Take 162 mg by mouth every evening.     Marland Kitchen atorvastatin (LIPITOR) 10 MG tablet Take 10 mg by mouth daily.    . baclofen (LIORESAL) 10 MG tablet Take 10 mg by mouth 3 (three) times daily as needed for muscle spasms.    . cholecalciferol (VITAMIN D) 1000 UNITS tablet Take 3,000 Units by mouth daily.     . cyanocobalamin (,VITAMIN B-12,) 1000 MCG/ML injection Inject 1,000 mcg into the muscle every 30 (thirty) days.     . Dimethyl Fumarate (TECFIDERA) 240 MG CPDR Take 240 mg by mouth every evening.    . DULoxetine (CYMBALTA) 30 MG capsule Take 1 capsule by mouth daily.    Marland Kitchen gabapentin (NEURONTIN) 300 MG capsule Take 600 mg by mouth 3 (three) times daily.     Marland Kitchen  ibuprofen (ADVIL,MOTRIN) 200 MG tablet Take 200-600 mg by mouth every 6 (six) hours as needed (for pain.).    Marland Kitchen traMADol (ULTRAM) 50 MG tablet Take 1 tablet (50 mg total) by mouth every 6 (six) hours as needed. 25 tablet 0  . HYDROcodone-acetaminophen (NORCO/VICODIN) 5-325 MG per tablet Take 1-2 tablets by mouth every 4 (four) hours as needed. 15 tablet 0   No facility-administered medications prior to visit.    PAST MEDICAL HISTORY: Past Medical History  Diagnosis Date  . Essential hypertension   . Fibromyalgia   . Discoid lupus   . Andrea  (multiple sclerosis)   . Hyperlipidemia with target LDL less than 130   . Obesity (BMI 35.0-39.9 without comorbidity)   . Heavy smoker (more than 20 cigarettes per day)     Was able to stop for 11 years but restarted about 10 years ago    PAST SURGICAL HISTORY: Past Surgical History  Procedure Laterality Date  . Abdominal hysterectomy  1995  . Cholecystectomy  1994  . Cesarean section  [place]  . Breast surgery Left     tissue removal    FAMILY HISTORY: Family History  Problem Relation Age of Onset  . Cancer Mother   . Cancer Father   . Diabetes Brother   . Diabetes Maternal Grandmother     SOCIAL HISTORY: History   Social History  . Marital Status: Legally Separated    Spouse Name: N/A    Number of Children: 2  . Years of Education: 12+   Occupational History  . Not on file.   Social History Main Topics  . Smoking status: Current Every Day Smoker -- 1.00 packs/day    Types: Cigarettes  . Smokeless tobacco: Never Used  . Alcohol Use: No  . Drug Use: No  . Sexual Activity: Not on file   Other Topics Concern  . Not on file   Social History Narrative   Patient lives at home with granddaughter her her 2 children.    Patient is separated. Patient has 2 children.    Patient has some college. Patient is on Disability.    Smokes roughly one pack a day. No alcohol.     PHYSICAL EXAM  Filed Vitals:   03/26/14 1043  BP: 127/81  Pulse: 80  Height: 5\' 2"  (1.575 m)  Weight: 220 lb (99.791 kg)   Body mass index is 40.23 kg/(m^2).  Generalized: Well developed, in no acute distress  Head: normocephalic and atraumatic,. Oropharynx benign  Neck: Supple, no carotid bruits  Cardiac: Regular rate rhythm, no murmur    Neurological examination   Mentation: Alert oriented to time, place, history taking. Follows all commands speech and language fluent  Cranial nerve II-XII: Fundoscopic exam reveals sharp disc margins.Pupils were equal round reactive to light  extraocular movements were full, visual field were full on confrontational test. Facial sensation and strength were normal. hearing was intact to finger rubbing bilaterally. Uvula tongue midline. head turning and shoulder shrug were normal and symmetric.Tongue protrusion into cheek strength was normal. Motor: normal bulk and tone, full strength in the BUE, BLE, fine finger movements normal, no pronator drift. No focal weakness Sensory: normal and symmetric to light touch, pinprick, and  vibration  Coordination: finger-nose-finger, heel-to-shin bilaterally, no dysmetria Reflexes: Brachioradialis 2/2, biceps 2/2, triceps 2/2, patellar 2/2, Achilles 2/2, plantar responses were flexor bilaterally. Gait and Station: Rising up from seated position without assistance, cautious gait, moderate stride   DIAGNOSTIC DATA (LABS, IMAGING,  TESTING) - I reviewed patient records, labs, notes, testing and imaging myself where available.  Lab Results  Component Value Date   WBC 7.5 11/04/2013   HGB 14.4 11/04/2013   HCT 43.2 11/04/2013   MCV 80.9 11/04/2013   PLT 449* 11/04/2013      Component Value Date/Time   NA 142 11/04/2013 2159   NA 140 01/25/2013 1112   K 4.5 11/04/2013 2159   CL 103 11/04/2013 2159   CO2 25 11/04/2013 2159   GLUCOSE 99 11/04/2013 2159   GLUCOSE 94 01/25/2013 1112   BUN 11 11/04/2013 2159   BUN 14 01/25/2013 1112   CREATININE 0.62 11/04/2013 2159   CALCIUM 9.3 11/04/2013 2159   PROT 8.1 07/05/2013 2130   PROT 7.2 01/25/2013 1112   ALBUMIN 4.1 07/05/2013 2130   AST 18 07/05/2013 2130   ALT 19 07/05/2013 2130   ALKPHOS 140* 07/05/2013 2130   BILITOT 0.4 07/05/2013 2130   GFRNONAA >90 11/04/2013 2159   GFRAA >90 11/04/2013 2159     ASSESSMENT AND PLAN  64 y.o. year old female with relapsing remitting multiple sclerosis, is taking Tecfidera since Oct 2013, now complains worsening low back pain, radiating pain to her right leg, right leg numbness,   This could  indicating Andrea flareups, with superimposed right lumbar radiculopathy  Repeat MRI of brain with and without contrast MRI of lumbar  EMG nerve conduction study    Orders Placed This Encounter  Procedures  . MR Brain W Wo Contrast  . MR Lumbar Spine Wo Contrast  . NCV with EMG(electromyography)    New Prescriptions   No medications on file    Medications Discontinued During This Encounter  Medication Reason  . HYDROcodone-acetaminophen (NORCO/VICODIN) 5-325 MG per tablet Therapy completed  . DULoxetine (CYMBALTA) 30 MG capsule Reorder  . gabapentin (NEURONTIN) 300 MG capsule Reorder  . traMADol (ULTRAM) 50 MG tablet Reorder    Return in about 2 weeks (around 04/09/2014).  Marcial Pacas, Ph.D. M.D.  [facility] [address] [place], [place] [address] [phone]

## 2014-04-12 ENCOUNTER — Emergency Department (HOSPITAL_COMMUNITY): Payer: Medicare Other

## 2014-04-12 ENCOUNTER — Encounter (HOSPITAL_COMMUNITY): Payer: Self-pay

## 2014-04-12 ENCOUNTER — Inpatient Hospital Stay (HOSPITAL_COMMUNITY)
Admission: EM | Admit: 2014-04-12 | Discharge: 2014-04-14 | DRG: 149 | Disposition: A | Discharge status: Home / Self Care | Payer: Medicare Other | Attending: Internal Medicine | Admitting: Internal Medicine

## 2014-04-12 DIAGNOSIS — G629 Polyneuropathy, unspecified: Secondary | ICD-10-CM | POA: Diagnosis present

## 2014-04-12 DIAGNOSIS — Z6835 Body mass index (BMI) 35.0-35.9, adult: Secondary | ICD-10-CM

## 2014-04-12 DIAGNOSIS — F17210 Nicotine dependence, cigarettes, uncomplicated: Secondary | ICD-10-CM | POA: Diagnosis present

## 2014-04-12 DIAGNOSIS — G35 Multiple sclerosis: Secondary | ICD-10-CM | POA: Diagnosis present

## 2014-04-12 DIAGNOSIS — E669 Obesity, unspecified: Secondary | ICD-10-CM | POA: Diagnosis present

## 2014-04-12 DIAGNOSIS — L930 Discoid lupus erythematosus: Secondary | ICD-10-CM | POA: Diagnosis present

## 2014-04-12 DIAGNOSIS — R0902 Hypoxemia: Secondary | ICD-10-CM | POA: Diagnosis present

## 2014-04-12 DIAGNOSIS — Z9049 Acquired absence of other specified parts of digestive tract: Secondary | ICD-10-CM | POA: Diagnosis present

## 2014-04-12 DIAGNOSIS — E785 Hyperlipidemia, unspecified: Secondary | ICD-10-CM | POA: Diagnosis present

## 2014-04-12 DIAGNOSIS — I10 Essential (primary) hypertension: Secondary | ICD-10-CM | POA: Diagnosis present

## 2014-04-12 DIAGNOSIS — R42 Dizziness and giddiness: Principal | ICD-10-CM | POA: Diagnosis present

## 2014-04-12 DIAGNOSIS — M4806 Spinal stenosis, lumbar region: Secondary | ICD-10-CM | POA: Diagnosis present

## 2014-04-12 DIAGNOSIS — R29810 Facial weakness: Secondary | ICD-10-CM | POA: Diagnosis present

## 2014-04-12 DIAGNOSIS — J209 Acute bronchitis, unspecified: Secondary | ICD-10-CM | POA: Diagnosis present

## 2014-04-12 DIAGNOSIS — Z90710 Acquired absence of both cervix and uterus: Secondary | ICD-10-CM

## 2014-04-12 DIAGNOSIS — Z7982 Long term (current) use of aspirin: Secondary | ICD-10-CM

## 2014-04-12 LAB — CBC WITH DIFFERENTIAL/PLATELET
Basophils Absolute: 0 10*3/uL (ref 0.0–0.1)
Basophils Relative: 0 % (ref 0–1)
Eosinophils Absolute: 0 10*3/uL (ref 0.0–0.7)
Eosinophils Relative: 0 % (ref 0–5)
HCT: 39.7 % (ref 36.0–46.0)
Hemoglobin: 13 g/dL (ref 12.0–15.0)
LYMPHS ABS: 0.9 10*3/uL (ref 0.7–4.0)
LYMPHS PCT: 14 % (ref 12–46)
MCH: 26.4 pg (ref 26.0–34.0)
MCHC: 32.7 g/dL (ref 30.0–36.0)
MCV: 80.5 fL (ref 78.0–100.0)
Monocytes Absolute: 0.4 10*3/uL (ref 0.1–1.0)
Monocytes Relative: 6 % (ref 3–12)
NEUTROS PCT: 80 % — AB (ref 43–77)
Neutro Abs: 5 10*3/uL (ref 1.7–7.7)
PLATELETS: 408 10*3/uL — AB (ref 150–400)
RBC: 4.93 MIL/uL (ref 3.87–5.11)
RDW: 15.9 % — ABNORMAL HIGH (ref 11.5–15.5)
WBC: 6.4 10*3/uL (ref 4.0–10.5)

## 2014-04-12 LAB — URINALYSIS, ROUTINE W REFLEX MICROSCOPIC
Bilirubin Urine: NEGATIVE
GLUCOSE, UA: NEGATIVE mg/dL
Hgb urine dipstick: NEGATIVE
KETONES UR: NEGATIVE mg/dL
Leukocytes, UA: NEGATIVE
Nitrite: NEGATIVE
Protein, ur: NEGATIVE mg/dL
Specific Gravity, Urine: 1.02 (ref 1.005–1.030)
Urobilinogen, UA: 1 mg/dL (ref 0.0–1.0)
pH: 6 (ref 5.0–8.0)

## 2014-04-12 LAB — I-STAT CHEM 8, ED
BUN: 10 mg/dL (ref 6–23)
CHLORIDE: 101 mmol/L (ref 96–112)
Calcium, Ion: 1.08 mmol/L — ABNORMAL LOW (ref 1.13–1.30)
Creatinine, Ser: 0.6 mg/dL (ref 0.50–1.10)
Glucose, Bld: 116 mg/dL — ABNORMAL HIGH (ref 70–99)
HEMATOCRIT: 43 % (ref 36.0–46.0)
HEMOGLOBIN: 14.6 g/dL (ref 12.0–15.0)
POTASSIUM: 4.4 mmol/L (ref 3.5–5.1)
SODIUM: 137 mmol/L (ref 135–145)
TCO2: 25 mmol/L (ref 0–100)

## 2014-04-12 LAB — I-STAT TROPONIN, ED: Troponin i, poc: 0 ng/mL (ref 0.00–0.08)

## 2014-04-12 LAB — BRAIN NATRIURETIC PEPTIDE: B Natriuretic Peptide: 32.8 pg/mL (ref 0.0–100.0)

## 2014-04-12 LAB — D-DIMER, QUANTITATIVE (NOT AT ARMC)

## 2014-04-12 MED ORDER — ALBUTEROL SULFATE HFA 108 (90 BASE) MCG/ACT IN AERS
4.0000 | INHALATION_SPRAY | Freq: Once | RESPIRATORY_TRACT | Status: AC
  Administered 2014-04-12: 4 via RESPIRATORY_TRACT
  Filled 2014-04-12: qty 6.7

## 2014-04-12 MED ORDER — ONDANSETRON HCL 4 MG/2ML IJ SOLN
4.0000 mg | Freq: Once | INTRAMUSCULAR | Status: AC
  Administered 2014-04-12: 4 mg via INTRAVENOUS
  Filled 2014-04-12: qty 2

## 2014-04-12 MED ORDER — SODIUM CHLORIDE 0.9 % IV BOLUS (SEPSIS)
1000.0000 mL | Freq: Once | INTRAVENOUS | Status: AC
  Administered 2014-04-12: 1000 mL via INTRAVENOUS

## 2014-04-12 NOTE — ED Provider Notes (Signed)
Patient complains of lightheadedness and feeling of room spinning onset approximately 5 PM today. She also complains of mild shortness of breath. And nausea. No other associated symptoms. On exam patient is alert Glasgow Coma Score 15 HEENT exam no facial asymmetry ears normal bilateral tympanic membranes normal neck supple trachea midline lungs clear auscultation heart regular rate and rhythm abdomen obese normal bowel sounds nontender. Neurologic Glasgow Coma Score 15 cranial nerves II through XII grossly intact moves all extremity as well  Orlie Dakin, MD 04/12/14 2016

## 2014-04-12 NOTE — ED Notes (Signed)
Daughter Velva Harman [phone]

## 2014-04-12 NOTE — ED Provider Notes (Signed)
CSN: [id number]     Arrival date & time 04/12/14  1830 History   First MD Initiated Contact with Patient 04/12/14 1832     No chief complaint on file.  (Consider location/radiation/quality/duration/timing/severity/associated sxs/prior Treatment) HPI Comments: 64 yo F hx of HTN, HLD, SLE, MS, Fibromyalgia, obestiy, hx of tobacco use, presents with CC of chest pain.   Patient is a 64 y.o. female presenting with dizziness. The history is provided by the patient and the EMS personnel. No language interpreter was used.  Dizziness Quality:  Lightheadedness and head spinning Severity:  Moderate Onset quality:  Sudden Duration:  2 hours Timing:  Intermittent Progression:  Unchanged Chronicity:  New Context: standing up   Context: not when bending over, not with bowel movement, not with ear pain, not with eye movement, not with head movement, not with inactivity, not with loss of consciousness, not with medication, not with physical activity and not when urinating   Relieved by:  Lying down Worsened by:  Sitting upright and standing up Ineffective treatments:  Change in position Associated symptoms: nausea and shortness of breath   Associated symptoms: no blood in stool, no chest pain, no diarrhea, no headaches, no palpitations, no syncope, no tinnitus, no vision changes, no vomiting and no weakness   Nausea:    Severity:  Mild   Onset quality:  Sudden   Duration:  2 hours   Timing:  Intermittent   Progression:  Unchanged Shortness of breath:    Severity:  Mild   Onset quality:  Sudden   Duration:  2 hours   Timing:  Intermittent   Progression:  Unchanged Risk factors: multiple medications   Risk factors: no anemia, no heart disease, no hx of stroke, no hx of vertigo, no Meniere's disease and no new medications     Past Medical History  Diagnosis Date  . Essential hypertension   . Fibromyalgia   . Discoid lupus   . MS (multiple sclerosis)   . Hyperlipidemia with target LDL less  than 130   . Obesity (BMI 35.0-39.9 without comorbidity)   . Heavy smoker (more than 20 cigarettes per day)     Was able to stop for 11 years but restarted about 10 years ago   Past Surgical History  Procedure Laterality Date  . Abdominal hysterectomy  1995  . Cholecystectomy  1994  . Cesarean section  [place]  . Breast surgery Left     tissue removal   Family History  Problem Relation Age of Onset  . Cancer Mother   . Cancer Father   . Diabetes Brother   . Diabetes Maternal Grandmother    History  Substance Use Topics  . Smoking status: Current Every Day Smoker -- 1.00 packs/day    Types: Cigarettes  . Smokeless tobacco: Never Used  . Alcohol Use: No   OB History    No data available     Review of Systems  Constitutional: Negative for fever and chills.  HENT: Negative for tinnitus.   Respiratory: Positive for shortness of breath. Negative for cough.   Cardiovascular: Negative for chest pain, palpitations, leg swelling and syncope.  Gastrointestinal: Positive for nausea. Negative for vomiting, diarrhea and blood in stool.  Musculoskeletal: Negative for myalgias.  Skin: Negative for rash.  Neurological: Negative for dizziness, weakness, light-headedness, numbness and headaches.  Hematological: Negative for adenopathy. Does not bruise/bleed easily.  All other systems reviewed and are negative.     Allergies  Review of patient's allergies  indicates no known allergies.  Home Medications   Prior to Admission medications   Medication Sig Start Date End Date Taking? Authorizing Provider  albuterol (PROVENTIL HFA;VENTOLIN HFA) 108 (90 BASE) MCG/ACT inhaler Inhale 2 puffs into the lungs every 6 (six) hours as needed. For shortness of breath.    Historical Provider, MD  amLODipine (NORVASC) 5 MG tablet Take 5 mg by mouth every morning.     Historical Provider, MD  aspirin EC 81 MG tablet Take 162 mg by mouth every evening.     Historical Provider, MD  atorvastatin  (LIPITOR) 10 MG tablet Take 10 mg by mouth daily.    Historical Provider, MD  baclofen (LIORESAL) 10 MG tablet Take 10 mg by mouth 3 (three) times daily as needed for muscle spasms.    Historical Provider, MD  cholecalciferol (VITAMIN D) 1000 UNITS tablet Take 3,000 Units by mouth daily.     Historical Provider, MD  cyanocobalamin (,VITAMIN B-12,) 1000 MCG/ML injection Inject 1,000 mcg into the muscle every 30 (thirty) days.     Historical Provider, MD  Dimethyl Fumarate (TECFIDERA) 240 MG CPDR Take 240 mg by mouth every evening.    Historical Provider, MD  DULoxetine (CYMBALTA) 60 MG capsule Take 1 capsule (60 mg total) by mouth daily. 03/26/14   Marcial Pacas, MD  gabapentin (NEURONTIN) 300 MG capsule Take 2 capsules (600 mg total) by mouth 3 (three) times daily. 03/26/14   Marcial Pacas, MD  ibuprofen (ADVIL,MOTRIN) 200 MG tablet Take 200-600 mg by mouth every 6 (six) hours as needed (for pain.).    Historical Provider, MD  traMADol (ULTRAM) 50 MG tablet Take 1 tablet (50 mg total) by mouth every 6 (six) hours as needed. 03/26/14   Marcial Pacas, MD   There were no vitals taken for this visit. Physical Exam  Constitutional: She is oriented to person, place, and time. She appears well-developed and well-nourished.  HENT:  Head: Normocephalic and atraumatic.  Right Ear: External ear normal.  Left Ear: External ear normal.  Nose: Nose normal.  Mouth/Throat: Oropharynx is clear and moist.  Eyes: Conjunctivae and EOM are normal.  Neck: Normal range of motion. Neck supple.  Cardiovascular: Normal rate, regular rhythm, normal heart sounds and intact distal pulses.   Pulmonary/Chest: Effort normal. No respiratory distress. She has wheezes. She has no rales. She exhibits no tenderness.  Bilateral wheezes  Abdominal: Soft. Bowel sounds are normal. She exhibits no distension and no mass. There is no tenderness. There is no rebound and no guarding.  Musculoskeletal: Normal range of motion.  Neurological: She is alert  and oriented to person, place, and time.  Cranial nerves intact.  No motor or sensory deficits in BUE, BLE.  Pt lightheaded with standing, sitting.   Skin: Skin is warm and dry.  Nursing note and vitals reviewed.   ED Course  Procedures (including critical care time) Labs Review Labs Reviewed  CBC WITH DIFFERENTIAL/PLATELET - Abnormal; Notable for the following:    RDW 15.9 (*)    Platelets 408 (*)    Neutrophils Relative % 80 (*)    All other components within normal limits  I-STAT CHEM 8, ED - Abnormal; Notable for the following:    Glucose, Bld 116 (*)    Calcium, Ion 1.08 (*)    All other components within normal limits  URINALYSIS, ROUTINE W REFLEX MICROSCOPIC  BRAIN NATRIURETIC PEPTIDE  D-DIMER, QUANTITATIVE  Randolm Idol, ED    Imaging Review Dg Chest 2 View  04/12/2014  CLINICAL DATA:  64 year old female with new onset of dizziness, lightheadedness and anxiety with sweating and nausea since 5 p.m. today.  EXAM: CHEST  2 VIEW  COMPARISON:  Chest x-ray 07/05/2013.  FINDINGS: Mild diffuse peribronchial cuffing. Lung volumes are normal. No consolidative airspace disease. No pleural effusions. No pneumothorax. No pulmonary nodule or mass noted. Pulmonary vasculature and the cardiomediastinal silhouette are within normal limits. Atherosclerotic calcifications in the thoracic aorta.  IMPRESSION: 1. Mild diffuse peribronchial cuffing, concerning for acute bronchitis. 2. Atherosclerosis.   Electronically Signed   By: Vinnie Langton M.D.   On: 04/12/2014 19:45     EKG Interpretation   Date/Time:  Thursday April 12 2014 18:42:07 EST Ventricular Rate:  65 PR Interval:  171 QRS Duration: 76 QT Interval:  416 QTC Calculation: 432 R Axis:   33 Text Interpretation:  Sinus rhythm Left atrial enlargement No significant  change since last tracing Confirmed by Winfred Leeds  MD, SAM [phone]) on  04/12/2014 8:04:48 PM      MDM   Final diagnoses:  Hypoxia  Lightheadedness     64 yo F hx of HTN, HLD, discoid lupus, MS, Fibromyalgia, obestiy, hx of tobacco use, presents with CC of lightheadedness, SOB which was sudden onset.    She had associated feeling of anxiety, tremulousness.  Pt reports feeling fine otherwise.  Pt seeing neurology for her relapsing-remitting MS, last visit on 2/8 with c/o left leg and back pain, R leg and foot tingling, and plan for MRI brain, MRI lumbar spine tomorrow.  Pt on Tecfidera for her MS with no recent changes in medication.  Pt denies previous occurrence of her symptoms tonight.  Denies CP or palpitations.    Physical exam as above.  O2 sats low of 87% with good wave form on RA.  Pt placed on 2L [place] with improvement.  Pt with bilateral wheezing on exam.    CXR with diffuse peribronchial cuffing, concern for acute bronchitis.  CBC, Chem 8, Troponin, BNP, D-Dimer, UA, all WNL.  Orthostatic VS WNL.    Pt given albuterol, with improvement in wheezing, but pt still drops to 89-90% on RA.  Pt given IVF bolus, but remains lightheaded.   Medicine consulted for admission given O2 requirement, and sudden onset lightheadedness.  Pt understands and agrees with plan.   Sinda Du  Discussed pt with my attending Dr. Winfred Leeds.    Sinda Du, MD 04/13/14 8032  Orlie Dakin, MD 04/14/14 1719

## 2014-04-12 NOTE — ED Notes (Signed)
Pt. Is from home, complaint of new onset dizziness/lightheadedness with some anxiety and sweating and nausea starting around 5 pm today. Pt. Has hx of MS, noted by nurse that works with pt. Regularly that starting last week she had some L sided droop to mouth but is neuromuscularly intact. Pt. CBG 117. Denies CP/SOB. EMS administered 4 mg of zofran.

## 2014-04-13 ENCOUNTER — Encounter (HOSPITAL_COMMUNITY): Payer: Self-pay | Admitting: Internal Medicine

## 2014-04-13 ENCOUNTER — Inpatient Hospital Stay (HOSPITAL_COMMUNITY): Payer: Medicare Other

## 2014-04-13 ENCOUNTER — Inpatient Hospital Stay: Admission: RE | Admit: 2014-04-13 | Payer: Medicare Other | Source: Ambulatory Visit

## 2014-04-13 DIAGNOSIS — J209 Acute bronchitis, unspecified: Secondary | ICD-10-CM | POA: Diagnosis present

## 2014-04-13 DIAGNOSIS — R29810 Facial weakness: Secondary | ICD-10-CM | POA: Diagnosis present

## 2014-04-13 DIAGNOSIS — F17210 Nicotine dependence, cigarettes, uncomplicated: Secondary | ICD-10-CM | POA: Diagnosis present

## 2014-04-13 DIAGNOSIS — Z9049 Acquired absence of other specified parts of digestive tract: Secondary | ICD-10-CM | POA: Diagnosis present

## 2014-04-13 DIAGNOSIS — E669 Obesity, unspecified: Secondary | ICD-10-CM | POA: Diagnosis present

## 2014-04-13 DIAGNOSIS — I10 Essential (primary) hypertension: Secondary | ICD-10-CM | POA: Diagnosis present

## 2014-04-13 DIAGNOSIS — L930 Discoid lupus erythematosus: Secondary | ICD-10-CM | POA: Diagnosis present

## 2014-04-13 DIAGNOSIS — M4806 Spinal stenosis, lumbar region: Secondary | ICD-10-CM | POA: Diagnosis present

## 2014-04-13 DIAGNOSIS — R42 Dizziness and giddiness: Principal | ICD-10-CM

## 2014-04-13 DIAGNOSIS — G35 Multiple sclerosis: Secondary | ICD-10-CM | POA: Diagnosis present

## 2014-04-13 DIAGNOSIS — G629 Polyneuropathy, unspecified: Secondary | ICD-10-CM | POA: Diagnosis present

## 2014-04-13 DIAGNOSIS — R0902 Hypoxemia: Secondary | ICD-10-CM | POA: Diagnosis present

## 2014-04-13 DIAGNOSIS — Z6835 Body mass index (BMI) 35.0-35.9, adult: Secondary | ICD-10-CM | POA: Diagnosis not present

## 2014-04-13 DIAGNOSIS — E785 Hyperlipidemia, unspecified: Secondary | ICD-10-CM | POA: Diagnosis present

## 2014-04-13 DIAGNOSIS — Z7982 Long term (current) use of aspirin: Secondary | ICD-10-CM | POA: Diagnosis not present

## 2014-04-13 DIAGNOSIS — Z90710 Acquired absence of both cervix and uterus: Secondary | ICD-10-CM | POA: Diagnosis not present

## 2014-04-13 LAB — CBC WITH DIFFERENTIAL/PLATELET
BASOS ABS: 0 10*3/uL (ref 0.0–0.1)
BASOS PCT: 0 % (ref 0–1)
EOS ABS: 0 10*3/uL (ref 0.0–0.7)
Eosinophils Relative: 1 % (ref 0–5)
HCT: 38.7 % (ref 36.0–46.0)
HEMOGLOBIN: 12.5 g/dL (ref 12.0–15.0)
Lymphocytes Relative: 18 % (ref 12–46)
Lymphs Abs: 1.2 10*3/uL (ref 0.7–4.0)
MCH: 26.1 pg (ref 26.0–34.0)
MCHC: 32.3 g/dL (ref 30.0–36.0)
MCV: 80.8 fL (ref 78.0–100.0)
Monocytes Absolute: 0.4 10*3/uL (ref 0.1–1.0)
Monocytes Relative: 6 % (ref 3–12)
NEUTROS PCT: 75 % (ref 43–77)
Neutro Abs: 4.8 10*3/uL (ref 1.7–7.7)
PLATELETS: 399 10*3/uL (ref 150–400)
RBC: 4.79 MIL/uL (ref 3.87–5.11)
RDW: 16 % — AB (ref 11.5–15.5)
WBC: 6.4 10*3/uL (ref 4.0–10.5)

## 2014-04-13 LAB — COMPREHENSIVE METABOLIC PANEL
ALBUMIN: 3.2 g/dL — AB (ref 3.5–5.2)
ALK PHOS: 98 U/L (ref 39–117)
ALT: 15 U/L (ref 0–35)
AST: 17 U/L (ref 0–37)
Anion gap: 6 (ref 5–15)
BUN: <5 mg/dL — ABNORMAL LOW (ref 6–23)
CO2: 26 mmol/L (ref 19–32)
Calcium: 8.3 mg/dL — ABNORMAL LOW (ref 8.4–10.5)
Chloride: 105 mmol/L (ref 96–112)
Creatinine, Ser: 0.7 mg/dL (ref 0.50–1.10)
GFR calc Af Amer: >90 mL/min (ref >90)
GFR calc non Af Amer: 90 mL/min — ABNORMAL LOW (ref >90)
Glucose, Bld: 93 mg/dL (ref 70–99)
POTASSIUM: 3.9 mmol/L (ref 3.5–5.1)
Sodium: 137 mmol/L (ref 135–145)
TOTAL PROTEIN: 6.3 g/dL (ref 6.0–8.3)
Total Bilirubin: 0.4 mg/dL (ref 0.3–1.2)

## 2014-04-13 LAB — TSH: TSH: 0.739 u[IU]/mL (ref 0.350–4.500)

## 2014-04-13 MED ORDER — DIMETHYL FUMARATE 240 MG PO CPDR: 240.0000 mg | DELAYED_RELEASE_CAPSULE | Freq: Every evening | ORAL | Status: DC

## 2014-04-13 MED ORDER — ENOXAPARIN SODIUM 40 MG/0.4ML ~~LOC~~ SOLN
40.0000 mg | SUBCUTANEOUS | Status: DC
  Administered 2014-04-13 – 2014-04-14 (×2): 40 mg via SUBCUTANEOUS
  Filled 2014-04-13 (×3): qty 0.4

## 2014-04-13 MED ORDER — SENNOSIDES-DOCUSATE SODIUM 8.6-50 MG PO TABS: 1.0000 | ORAL_TABLET | Freq: Every evening | ORAL | Status: DC | PRN

## 2014-04-13 MED ORDER — BACLOFEN 10 MG PO TABS
10.0000 mg | ORAL_TABLET | Freq: Three times a day (TID) | ORAL | Status: DC | PRN
  Administered 2014-04-13: 10 mg via ORAL
  Filled 2014-04-13 (×3): qty 1

## 2014-04-13 MED ORDER — VITAMIN D3 25 MCG (1000 UNIT) PO TABS
3000.0000 [IU] | ORAL_TABLET | Freq: Every day | ORAL | Status: DC
  Administered 2014-04-13 – 2014-04-14 (×2): 3000 [IU] via ORAL
  Filled 2014-04-13 (×2): qty 3

## 2014-04-13 MED ORDER — METHOCARBAMOL 1000 MG/10ML IJ SOLN
500.0000 mg | Freq: Three times a day (TID) | INTRAVENOUS | Status: DC | PRN
  Filled 2014-04-13: qty 5

## 2014-04-13 MED ORDER — ALBUTEROL SULFATE (2.5 MG/3ML) 0.083% IN NEBU: 2.5000 mg | INHALATION_SOLUTION | RESPIRATORY_TRACT | Status: DC | PRN

## 2014-04-13 MED ORDER — CYANOCOBALAMIN 1000 MCG/ML IJ SOLN: 1000.0000 ug | INTRAMUSCULAR | Status: DC

## 2014-04-13 MED ORDER — MECLIZINE HCL 25 MG PO TABS
25.0000 mg | ORAL_TABLET | Freq: Three times a day (TID) | ORAL | Status: DC | PRN
  Filled 2014-04-13: qty 1

## 2014-04-13 MED ORDER — TRAMADOL HCL 50 MG PO TABS
50.0000 mg | ORAL_TABLET | Freq: Four times a day (QID) | ORAL | Status: DC | PRN
  Administered 2014-04-14 (×2): 50 mg via ORAL
  Filled 2014-04-13 (×2): qty 1

## 2014-04-13 MED ORDER — ATORVASTATIN CALCIUM 10 MG PO TABS
10.0000 mg | ORAL_TABLET | Freq: Every day | ORAL | Status: DC
Start: 2014-04-13 — End: 2014-04-14
  Administered 2014-04-13 – 2014-04-14 (×2): 10 mg via ORAL
  Filled 2014-04-13 (×2): qty 1

## 2014-04-13 MED ORDER — GABAPENTIN 300 MG PO CAPS
600.0000 mg | ORAL_CAPSULE | Freq: Three times a day (TID) | ORAL | Status: DC
  Administered 2014-04-13 – 2014-04-14 (×5): 600 mg via ORAL
  Filled 2014-04-13 (×6): qty 2

## 2014-04-13 MED ORDER — GADOBENATE DIMEGLUMINE 529 MG/ML IV SOLN
20.0000 mL | Freq: Once | INTRAVENOUS | Status: AC
  Administered 2014-04-13: 20 mL via INTRAVENOUS

## 2014-04-13 MED ORDER — DIMETHYL FUMARATE 240 MG PO CPDR
240.0000 mg | DELAYED_RELEASE_CAPSULE | Freq: Two times a day (BID) | ORAL | Status: DC
  Administered 2014-04-13: 240 mg via ORAL

## 2014-04-13 MED ORDER — ASPIRIN EC 81 MG PO TBEC
162.0000 mg | DELAYED_RELEASE_TABLET | Freq: Every evening | ORAL | Status: DC
  Administered 2014-04-13: 162 mg via ORAL
  Filled 2014-04-13 (×2): qty 2

## 2014-04-13 MED ORDER — DULOXETINE HCL 60 MG PO CPEP
60.0000 mg | ORAL_CAPSULE | Freq: Every day | ORAL | Status: DC
  Administered 2014-04-13 – 2014-04-14 (×2): 60 mg via ORAL
  Filled 2014-04-13 (×2): qty 1

## 2014-04-13 MED ORDER — AMLODIPINE BESYLATE 5 MG PO TABS
5.0000 mg | ORAL_TABLET | Freq: Every morning | ORAL | Status: DC
  Administered 2014-04-13 – 2014-04-14 (×2): 5 mg via ORAL
  Filled 2014-04-13 (×2): qty 1

## 2014-04-13 NOTE — Evaluation (Signed)
Clinical/Bedside Swallow Evaluation Patient Details  Name: Andrea Cantrell MRN: [id number] Date of Birth: 12/12/1950  Today's Date: 04/13/2014 Time: SLP Start Time (ACUTE ONLY): 1027 SLP Stop Time (ACUTE ONLY): 1045 SLP Time Calculation (min) (ACUTE ONLY): 18 min  Past Medical History:  Past Medical History  Diagnosis Date  . Essential hypertension   . Fibromyalgia   . Discoid lupus   . MS (multiple sclerosis)   . Hyperlipidemia with target LDL less than 130   . Obesity (BMI 35.0-39.9 without comorbidity)   . Heavy smoker (more than 20 cigarettes per day)     Was able to stop for 11 years but restarted about 10 years ago   Past Surgical History:  Past Surgical History  Procedure Laterality Date  . Abdominal hysterectomy  1995  . Cholecystectomy  1994  . Cesarean section  [place]  . Breast surgery Left     tissue removal   HPI:  Andrea Cantrell is a 64 y.o. female with history of remitting relapsing multiple sclerosis, hypertension, hyperlipidemia, discoid lupus presents to the ER because of some onset of dizziness. Patient states since 5 PM last evening patient started getting dizzy and was finding it difficult to walk because of the dizziness. Patient has been recently followed up by neurologist for increasing low back pain and numbness of the lower extremity and is originally planned to have MRI brain and lumbar spine today. In the ER patient also was found to be hypoxic and mildly wheezing. Chest x-ray showing bronchitis features. Patient's wheezing improved with 1 dose of nebulizer. The patient was found to be mildly hypoxic. Patient denies any chest pain nausea vomiting headache visual symptoms abdominal pain diarrhea fever chills. Patient stated that she also has been noticed to have left facial droop.    Assessment / Plan / Recommendation Clinical Impression  Pt referred for speech swallow evaluation after failing the stroke swallow screen (told the RN she had a previous  h/o dysphagia).  Pt denies any difficulty at this time.  Swallow function appears to be within normal limits at bedside, with timely swallow initiation, no cough after swallow, and voice remains clear after swallows.  No difficulty chewing, but does states large pills sometimes stick.  No overt s/s of aspiration noted clinically.    Aspiration Risk  Mild    Diet Recommendation Dysphagia 3 (Mechanical Soft);Thin liquid   Liquid Administration via: Cup;Straw Medication Administration: Whole meds with puree Supervision: Patient able to self feed Compensations: Slow rate;Small sips/bites Postural Changes and/or Swallow Maneuvers: Seated upright 90 degrees    Other  Recommendations Oral Care Recommendations: Oral care BID Other Recommendations: Clarify dietary restrictions   Follow Up Recommendations  None    Frequency and Duration        Pertinent Vitals/Pain n/a         Swallow Study Prior Functional Status       General HPI: Andrea Cantrell is a 64 y.o. female with history of remitting relapsing multiple sclerosis, hypertension, hyperlipidemia, discoid lupus presents to the ER because of some onset of dizziness. Patient states since 5 PM last evening patient started getting dizzy and was finding it difficult to walk because of the dizziness. Patient has been recently followed up by neurologist for increasing low back pain and numbness of the lower extremity and is originally planned to have MRI brain and lumbar spine today. In the ER patient also was found to be hypoxic and mildly wheezing. Chest x-ray showing  bronchitis features. Patient's wheezing improved with 1 dose of nebulizer. The patient was found to be mildly hypoxic. Patient denies any chest pain nausea vomiting headache visual symptoms abdominal pain diarrhea fever chills. Patient stated that she also has been noticed to have left facial droop.  Type of Study: Bedside swallow evaluation Previous Swallow Assessment:  none Diet Prior to this Study: NPO Temperature Spikes Noted: No Respiratory Status: Room air History of Recent Intubation: No Behavior/Cognition: Alert;Cooperative;Pleasant mood Oral Cavity - Dentition: Adequate natural dentition Self-Feeding Abilities: Able to feed self Patient Positioning: Upright in bed Baseline Vocal Quality: Clear Volitional Cough: Strong Volitional Swallow: Able to elicit    Oral/Motor/Sensory Function Overall Oral Motor/Sensory Function: Appears within functional limits for tasks assessed   Ice Chips Ice chips: Within functional limits   Thin Liquid Thin Liquid: Within functional limits Presentation: Cup;Straw    Nectar Thick Nectar Thick Liquid: Not tested   Honey Thick Honey Thick Liquid: Not tested   Puree Puree: Within functional limits Presentation: Spoon   Solid   GO    Solid: Within functional limits Presentation: Self [name], [name] T 04/13/2014,11:25 AM

## 2014-04-13 NOTE — Progress Notes (Signed)
Pt called daughter to come help assist with bath. Honoring patient preferences and will check back with pt to make sure she has bathed.

## 2014-04-13 NOTE — Progress Notes (Signed)
Received report. Awaiting patient's arrival.  

## 2014-04-13 NOTE — ED Notes (Signed)
Notified pt's daughter Velva Harman of patient's transfer to [phone]

## 2014-04-13 NOTE — H&P (Addendum)
[facility] History and Physical  Andrea Cantrell XBM:[id number] DOB: Sep 09, 1950 DOA: 04/12/2014  Referring physician: ER physician. PCP: Pcp Not In System   Chief Complaint: Dizziness.  HPI: Andrea Cantrell is a 64 y.o. female with history of remitting relapsing multiple sclerosis, hypertension, hyperlipidemia, discoid lupus presents to the ER because of some onset of dizziness. Patient states since 5 PM last evening patient started getting dizzy and was finding it difficult to walk because of the dizziness. Patient has been recently followed up by neurologist for increasing low back pain and numbness of the lower extremity and is originally planned to have MRI brain and lumbar spine today. In the ER patient also was found to be hypoxic and mildly wheezing. Chest x-ray showing bronchitis features. Patient's wheezing improved with 1 dose of nebulizer. The patient was found to be mildly hypoxic. Patient denies any chest pain nausea vomiting headache visual symptoms abdominal pain diarrhea fever chills. Patient stated that she also has been noticed to have left facial droop.   Review of Systems: As presented in the history of presenting illness, rest negative.  Past Medical History  Diagnosis Date  . Essential hypertension   . Fibromyalgia   . Discoid lupus   . MS (multiple sclerosis)   . Hyperlipidemia with target LDL less than 130   . Obesity (BMI 35.0-39.9 without comorbidity)   . Heavy smoker (more than 20 cigarettes per day)     Was able to stop for 11 years but restarted about 10 years ago   Past Surgical History  Procedure Laterality Date  . Abdominal hysterectomy  1995  . Cholecystectomy  1994  . Cesarean section  [place]  . Breast surgery Left     tissue removal   Social History:  reports that she has been smoking Cigarettes.  She has been smoking about 1.00 pack per day. She has never used smokeless tobacco. She reports that she does not drink alcohol or use  illicit drugs. Where does patient live home. Can patient participate in ADLs? Not sure.  No Known Allergies  Family History:  Family History  Problem Relation Age of Onset  . Cancer Mother   . Cancer Father   . Diabetes Brother   . Diabetes Maternal Grandmother       Prior to Admission medications   Medication Sig Start Date End Date Taking? Authorizing Provider  albuterol (PROVENTIL HFA;VENTOLIN HFA) 108 (90 BASE) MCG/ACT inhaler Inhale 2 puffs into the lungs every 6 (six) hours as needed. For shortness of breath.    Historical Provider, MD  amLODipine (NORVASC) 5 MG tablet Take 5 mg by mouth every morning.     Historical Provider, MD  aspirin EC 81 MG tablet Take 162 mg by mouth every evening.     Historical Provider, MD  atorvastatin (LIPITOR) 10 MG tablet Take 10 mg by mouth daily.    Historical Provider, MD  baclofen (LIORESAL) 10 MG tablet Take 10 mg by mouth 3 (three) times daily as needed for muscle spasms.    Historical Provider, MD  cholecalciferol (VITAMIN D) 1000 UNITS tablet Take 3,000 Units by mouth daily.     Historical Provider, MD  cyanocobalamin (,VITAMIN B-12,) 1000 MCG/ML injection Inject 1,000 mcg into the muscle every 30 (thirty) days.     Historical Provider, MD  Dimethyl Fumarate (TECFIDERA) 240 MG CPDR Take 240 mg by mouth every evening.    Historical Provider, MD  DULoxetine (CYMBALTA) 60 MG capsule Take 1 capsule (  60 mg total) by mouth daily. 03/26/14   Marcial Pacas, MD  gabapentin (NEURONTIN) 300 MG capsule Take 2 capsules (600 mg total) by mouth 3 (three) times daily. 03/26/14   Marcial Pacas, MD  ibuprofen (ADVIL,MOTRIN) 200 MG tablet Take 200-600 mg by mouth every 6 (six) hours as needed (for pain.).    Historical Provider, MD  traMADol (ULTRAM) 50 MG tablet Take 1 tablet (50 mg total) by mouth every 6 (six) hours as needed. 03/26/14   Marcial Pacas, MD    Physical Exam: Filed Vitals:   04/12/14 2345 04/13/14 0045 04/13/14 0100 04/13/14 0203  BP: 99/55 108/56 111/58  122/59  Pulse: 72 71 72 71  Temp:    97.7 F (36.5 C)  TempSrc:    Oral  Resp: 18 19 18 18   SpO2: 96% 97% 99% 97%     General:  Moderately built and nourished.  Eyes: Anicteric no pallor.  ENT: No discharge from the ears eyes nose and mouth.  Neck: No mass felt.  Cardiovascular: S1-S2 heard.  Respiratory: No rhonchi or crepitations.  Abdomen: Soft nontender bowel sounds present.  Skin: No rash.  Musculoskeletal: No edema.  Psychiatric: Appears normal.  Neurologic: Alert awake oriented to time place and person. Mild left facial droop. Patient has generalized weakness. PERRLA positive. Tongue is midline.  Labs on Admission:  Basic Metabolic Panel:  Recent Labs Lab 04/12/14 2012  NA 137  K 4.4  CL 101  GLUCOSE 116*  BUN 10  CREATININE 0.60   Liver Function Tests: No results for input(s): AST, ALT, ALKPHOS, BILITOT, PROT, ALBUMIN in the last 168 hours. No results for input(s): LIPASE, AMYLASE in the last 168 hours. No results for input(s): AMMONIA in the last 168 hours. CBC:  Recent Labs Lab 04/12/14 2002 04/12/14 2012  WBC 6.4  --   NEUTROABS 5.0  --   HGB 13.0 14.6  HCT 39.7 43.0  MCV 80.5  --   PLT 408*  --    Cardiac Enzymes: No results for input(s): CKTOTAL, CKMB, CKMBINDEX, TROPONINI in the last 168 hours.  BNP (last 3 results)  Recent Labs  04/12/14 2002  BNP 32.8    ProBNP (last 3 results) No results for input(s): PROBNP in the last 8760 hours.  CBG: No results for input(s): GLUCAP in the last 168 hours.  Radiological Exams on Admission: Dg Chest 2 View  04/12/2014   CLINICAL DATA:  64 year old female with new onset of dizziness, lightheadedness and anxiety with sweating and nausea since 5 p.m. today.  EXAM: CHEST  2 VIEW  COMPARISON:  Chest x-ray 07/05/2013.  FINDINGS: Mild diffuse peribronchial cuffing. Lung volumes are normal. No consolidative airspace disease. No pleural effusions. No pneumothorax. No pulmonary nodule or mass  noted. Pulmonary vasculature and the cardiomediastinal silhouette are within normal limits. Atherosclerotic calcifications in the thoracic aorta.  IMPRESSION: 1. Mild diffuse peribronchial cuffing, concerning for acute bronchitis. 2. Atherosclerosis.   Electronically Signed   By: Vinnie Langton M.D.   On: 04/12/2014 19:45    EKG: Independently reviewed. Normal sinus rhythm.  Assessment/Plan Principal Problem:   Dizziness Active Problems:   Essential hypertension   Hypoxia   Acute bronchitis   1. Dizziness with history of multiple sclerosis relapsing and remitting course - on exam patient does have mild left facial droop. Patient also has generalized weakness and on trying to make walk patient feels intensely dizzy. Patient was originally scheduled to have MRI brain and lumbar spine later today which I  have ordered at this time. Discussed with neurologist Dr. Aram Beecham and they will be seeing patient in consult. Get physical therapy consult. Closely observe. Further recommendation based on MRI finding in physical therapy. Closely monitor in telemetry for any arrhythmias and also check orthostatics. Continue present medications for multiple sclerosis. 2. Bronchitis - patient's wheezing improved with nebulizer. Patient still mildly hypoxic. D-dimer has been negative. Closely observe. 3. Tobacco abuse - tobacco cessation counseling requested. 4. Hypertension - continue present medications. 5. Hyperlipidemia on statins.   DVT Prophylaxis Lovenox.  Code Status: Full code.  Family Communication: None.  Disposition Plan: Admit to inpatient.    Julitza Rickles N. [facility] Pager [phone].  If 7PM-7AM, please contact night-coverage [email or web address] Password [facility] 04/13/2014, 2:48 AM

## 2014-04-13 NOTE — Consult Note (Signed)
Reason for Consult:Dizziness Referring Physician: [place]  CC: Dizziness  HPI: Andrea Cantrell is an 64 y.o. female presenting with complaints of dizziness.  The patient reports that at [place] on 2/25 she had the acute onset of dizziness.  Although the patient does not do much walking she does get up at times and noted that she was more off balance.   Patient also reports that for the past few weeks she has had numbness in her right foot.  She was to have a MRI of the lumbar spine in work up of this as an outpatient.   Patient also reports that for the past month she has not been as active.  Feels as if when she gets up to do something her back gives out and she has to sit down after a short period of time.    Past Medical History  Diagnosis Date  . Essential hypertension   . Fibromyalgia   . Discoid lupus   . MS (multiple sclerosis)   . Hyperlipidemia with target LDL less than 130   . Obesity (BMI 35.0-39.9 without comorbidity)   . Heavy smoker (more than 20 cigarettes per day)     Was able to stop for 11 years but restarted about 10 years ago    Past Surgical History  Procedure Laterality Date  . Abdominal hysterectomy  1995  . Cholecystectomy  1994  . Cesarean section  [place]  . Breast surgery Left     tissue removal    Family History  Problem Relation Age of Onset  . Cancer Mother   . Cancer Father   . Diabetes Brother   . Diabetes Maternal Grandmother     Social History:  reports that she has been smoking Cigarettes.  She has been smoking about 1.00 pack per day. She has never used smokeless tobacco. She reports that she does not drink alcohol or use illicit drugs.  No Known Allergies  Medications:  I have reviewed the patient's current medications. Prior to Admission:  Prescriptions prior to admission  Medication Sig Dispense Refill Last Dose  . albuterol (PROVENTIL HFA;VENTOLIN HFA) 108 (90 BASE) MCG/ACT inhaler Inhale 2 puffs into the lungs every 6 (six) hours  as needed for shortness of breath. For shortness of breath.   4 days  . amLODipine (NORVASC) 5 MG tablet Take 5 mg by mouth every morning.    04/12/2014 at Unknown time  . aspirin EC 81 MG tablet Take 162 mg by mouth every evening.    04/11/2014  . atorvastatin (LIPITOR) 10 MG tablet Take 10 mg by mouth every morning.    04/12/2014 at Unknown time  . baclofen (LIORESAL) 10 MG tablet Take 10 mg by mouth See admin instructions. Takes twice daily but can take extra dose if needed   04/12/2014 at Unknown time  . cholecalciferol (VITAMIN D) 1000 UNITS tablet Take 3,000 Units by mouth every evening.    04/11/2014  . cyanocobalamin (,VITAMIN B-12,) 1000 MCG/ML injection Inject 1,000 mcg into the muscle every 30 (thirty) days.    couple months  . Dimethyl Fumarate (TECFIDERA) 240 MG CPDR Take 240 mg by mouth every 12 (twelve) hours.    04/12/2014 at Unknown time  . DULoxetine (CYMBALTA) 60 MG capsule Take 1 capsule (60 mg total) by mouth daily. 30 capsule 11 04/12/2014 at am  . gabapentin (NEURONTIN) 300 MG capsule Take 2 capsules (600 mg total) by mouth 3 (three) times daily. 180 capsule 11 04/12/2014 at Unknown  time  . ibuprofen (ADVIL,MOTRIN) 200 MG tablet Take 200-600 mg by mouth every 6 (six) hours as needed (pain).    month  . traMADol (ULTRAM) 50 MG tablet Take 1 tablet (50 mg total) by mouth every 6 (six) hours as needed. (Patient taking differently: Take 100 mg by mouth 2 (two) times daily. ) 60 tablet 5 04/12/2014 at Unknown time   Scheduled: . amLODipine  5 mg Oral q morning - 10a  . aspirin EC  162 mg Oral QPM  . atorvastatin  10 mg Oral Daily  . cholecalciferol  3,000 Units Oral Daily  . Dimethyl Fumarate  240 mg Oral Q12H  . DULoxetine  60 mg Oral Daily  . enoxaparin (LOVENOX) injection  40 mg Subcutaneous Q24H  . gabapentin  600 mg Oral TID    ROS: History obtained from the patient  General ROS: negative for - chills, fatigue, fever, night sweats, weight gain or weight loss Psychological  ROS: negative for - behavioral disorder, hallucinations, memory difficulties, mood swings or suicidal ideation Ophthalmic ROS: negative for - blurry vision, double vision, eye pain or loss of vision ENT ROS: negative for - epistaxis, nasal discharge, oral lesions, sore throat, tinnitus or vertigo Allergy and Immunology ROS: negative for - hives or itchy/watery eyes Hematological and Lymphatic ROS: negative for - bleeding problems, bruising or swollen lymph nodes Endocrine ROS: negative for - galactorrhea, hair pattern changes, polydipsia/polyuria or temperature intolerance Respiratory ROS: negative for - cough, hemoptysis, shortness of breath or wheezing Cardiovascular ROS: negative for - chest pain, dyspnea on exertion, edema or irregular heartbeat Gastrointestinal ROS: negative for - abdominal pain, diarrhea, hematemesis, nausea/vomiting or stool incontinence Genito-Urinary ROS: negative for - dysuria, hematuria, incontinence or urinary frequency/urgency Musculoskeletal ROS: as noted in HPI Neurological ROS: as noted in HPI Dermatological ROS: negative for rash and skin lesion changes  Physical Examination: Blood pressure 145/72, pulse 72, temperature 98.5 F (36.9 C), temperature source Oral, resp. rate 17, height 5\' 2"  (1.575 m), weight 100.7 kg (222 lb 0.1 oz), SpO2 95 %.  HEENT-  Normocephalic, no lesions, without obvious abnormality.  Normal external eye and conjunctiva.  Normal TM's bilaterally.  Normal auditory canals and external ears. Normal external nose, mucus membranes and septum.  Normal pharynx. Cardiovascular- S1, S2 normal, pulses palpable throughout   Lungs- chest clear, no wheezing, rales, normal symmetric air entry Abdomen- soft, non-tender; bowel sounds normal; no masses,  no organomegaly Extremities- no edema Lymph-no adenopathy palpable Musculoskeletal-no joint tenderness, deformity or swelling Skin-warm and dry, no hyperpigmentation, vitiligo, or suspicious  lesions  Neurological Examination Mental Status: Alert, oriented, thought content appropriate.  Speech fluent without evidence of aphasia.  Able to follow 3 step commands without difficulty. Cranial Nerves: II: Discs flat bilaterally; Visual fields grossly normal, pupils equal, round, reactive to light and accommodation III,IV, VI: ptosis not present, extra-ocular motions intact bilaterally V,VII: mild left facial droop, facial light touch sensation normal bilaterally VIII: hearing normal bilaterally IX,X: gag reflex present XI: bilateral shoulder shrug XII: midline tongue extension Motor: Right : Upper extremity   5-/5 proximally   Left:     Upper extremity   5-/5 proximally  Lower extremity   5/5     Lower extremity   5/5 Tone and bulk:normal tone throughout; no atrophy noted Sensory: Pinprick and light touch decreased on the sole and lateral aspect of the right foot Deep Tendon Reflexes: 2+ in the upper extremities, 1+ the knees and absent at the ankles Plantars: Right: downgoing  Left: downgoing Cerebellar: normal finger-to-nose and normal heel-to-shin testing bilaterally Gait: unable to test due to safety concerns   Laboratory Studies:   Basic Metabolic Panel:  Recent Labs Lab 04/12/14 2012 04/13/14 0804  NA 137 137  K 4.4 3.9  CL 101 105  CO2  --  26  GLUCOSE 116* 93  BUN 10 <5*  CREATININE 0.60 0.70  CALCIUM  --  8.3*    Liver Function Tests:  Recent Labs Lab 04/13/14 0804  AST 17  ALT 15  ALKPHOS 98  BILITOT 0.4  PROT 6.3  ALBUMIN 3.2*   No results for input(s): LIPASE, AMYLASE in the last 168 hours. No results for input(s): AMMONIA in the last 168 hours.  CBC:  Recent Labs Lab 04/12/14 2002 04/12/14 2012 04/13/14 0804  WBC 6.4  --  6.4  NEUTROABS 5.0  --  4.8  HGB 13.0 14.6 12.5  HCT 39.7 43.0 38.7  MCV 80.5  --  80.8  PLT 408*  --  399    Cardiac Enzymes: No results for input(s): CKTOTAL, CKMB, CKMBINDEX, TROPONINI in the last 168  hours.  BNP: Invalid input(s): POCBNP  CBG: No results for input(s): GLUCAP in the last 168 hours.  Microbiology: Results for orders placed or performed during the hospital encounter of 11/02/11  Gram stain     Status: None   Collection Time: 11/02/11  9:00 AM  Result Value Ref Range Status   Gram Stain No WBC Seen  Final   Gram Stain No Organisms Seen  Final    Coagulation Studies: No results for input(s): LABPROT, INR in the last 72 hours.  Urinalysis:  Recent Labs Lab 04/12/14 2056  COLORURINE YELLOW  LABSPEC 1.020  PHURINE 6.0  GLUCOSEU NEGATIVE  HGBUR NEGATIVE  BILIRUBINUR NEGATIVE  KETONESUR NEGATIVE  PROTEINUR NEGATIVE  UROBILINOGEN 1.0  NITRITE NEGATIVE  LEUKOCYTESUR NEGATIVE    Lipid Panel:  No results found for: CHOL, TRIG, HDL, CHOLHDL, VLDL, LDLCALC  HgbA1C: No results found for: HGBA1C  Urine Drug Screen:  No results found for: LABOPIA, COCAINSCRNUR, LABBENZ, AMPHETMU, THCU, LABBARB  Alcohol Level: No results for input(s): ETH in the last 168 hours.  Other results: EKG: sinus rhythm at 65 bpm.  Imaging: Dg Chest 2 View  04/12/2014   CLINICAL DATA:  64 year old female with new onset of dizziness, lightheadedness and anxiety with sweating and nausea since 5 p.m. today.  EXAM: CHEST  2 VIEW  COMPARISON:  Chest x-ray 07/05/2013.  FINDINGS: Mild diffuse peribronchial cuffing. Lung volumes are normal. No consolidative airspace disease. No pleural effusions. No pneumothorax. No pulmonary nodule or mass noted. Pulmonary vasculature and the cardiomediastinal silhouette are within normal limits. Atherosclerotic calcifications in the thoracic aorta.  IMPRESSION: 1. Mild diffuse peribronchial cuffing, concerning for acute bronchitis. 2. Atherosclerosis.   Electronically Signed   By: Vinnie Langton M.D.   On: 04/12/2014 19:45   Mr Jeri Cos FB Contrast  04/13/2014   CLINICAL DATA:  Multiple sclerosis.  Dizziness.  EXAM: MRI HEAD WITHOUT AND WITH CONTRAST   TECHNIQUE: Multiplanar, multiecho pulse sequences of the brain and surrounding structures were obtained without and with intravenous contrast.  CONTRAST:  69mL MULTIHANCE GADOBENATE DIMEGLUMINE 529 MG/ML IV SOLN  COMPARISON:  08/02/2013  FINDINGS: Partially empty sella is again noted. There is no evidence of acute infarct, intracranial hemorrhage, mass, midline shift, or extra-axial fluid collection. Ventricles and sulci are unchanged and within normal limits for age. Again seen are numerous foci of T2 hyperintensity in  the juxtacortical, subcortical, and periventricular cerebral white matter bilaterally, including multiple lesions oriented perpendicularly to the lateral ventricles. Abnormal T2 hyperintensity is also again seen along the callososeptal interface and in the brainstem. Overall lesion burden appears unchanged. No enhancing lesions are identified.  Orbits are unremarkable. Paranasal sinuses and mastoid air cells are clear. Major intracranial vascular flow voids are preserved.  IMPRESSION: 1. No evidence of acute intracranial abnormality. 2. Unchanged white matter lesions consistent with multiple sclerosis. No enhancing lesions seen to suggest acute demyelination.   Electronically Signed   By: Logan Bores   On: 04/13/2014 08:10   Mr Lumbar Spine Wo Contrast  04/13/2014   CLINICAL DATA:  Increasing low back pain and numbness of the right lower extremity. Multiple sclerosis.  EXAM: MRI LUMBAR SPINE WITHOUT CONTRAST  TECHNIQUE: Multiplanar, multisequence MR imaging of the lumbar spine was performed. No intravenous contrast was administered.  COMPARISON:  CT abdomen and pelvis 01/06/2010  FINDINGS: There is new grade 1 anterolisthesis of L4 on L5 measuring 4 mm, facet mediated. Disc desiccation is present from L3-4 to L5-S1 with mild disc space narrowing at L3-4 and mild-to-moderate narrowing at L4-5 and L5-S1. Disc degeneration is also again seen at T11-12 with mild degenerative marrow changes and a  small T12 superior endplate Schmorl's node noted. Very mild degenerative endplate marrow changes are also present in the lumbar spine.  There is mild marrow edema about the L4-5 facet joints likely reflecting chronic/ acute on chronic facet arthritis. There is mild narrowing of the mid to lower lumbar spinal canal due on a congenital basis due to short pedicles. Conus medullaris is normal in signal and terminates at the mid L2 level. Paraspinal soft tissues are unremarkable.  L1-2: Mild facet hypertrophy without disc herniation or stenosis.  L2-3:  Mild facet hypertrophy without disc herniation or stenosis.  L3-4: Mild disc bulging and moderate facet and ligamentum flavum hypertrophy in the setting of congenitally short pedicles result in moderate spinal stenosis and mild right and moderate left neural foraminal stenosis. There is a 7 x 4 mm cyst in the left ligamentum flavum which mildly contributes to the spinal canal narrowing.  L4-5: Listhesis with disc uncovering and severe facet and ligamentum flavum hypertrophy result in severe spinal stenosis and mild-to-moderate bilateral neural foraminal stenosis. There are small bilateral facet joint effusions.  L5-S1: Mild diffuse disc bulging and moderate facet hypertrophy result in mild bilateral neural foraminal stenosis without spinal stenosis.  IMPRESSION: 1. Severe facet arthrosis at L4-5 with new grade 1 anterolisthesis and severe spinal stenosis. 2. Moderate multifactorial spinal stenosis and moderate left neural foraminal stenosis at L3-4.   Electronically Signed   By: Logan Bores   On: 04/13/2014 08:01     Assessment/Plan: 64 year old female with a history of MS presenting with complaints of dizziness and right foot numbness.  MRI of the brain performed and personally reviewed.  No acute abnormalities or active MS lesions noted.  Dizziness is improved.  MRI of the lumbar spine shows spinal stenosis which very likely explains her right foot complaints and  her difficulty with gait.  Options for treatment of spinal stenosis discussed.  Patient does not wish to entertain surgery at this time.    Recommendations: 1.  Meclizine 25mg  po TID prn 2.  Vestibular rehab 3.  PT 4.  Follow up with Dr. Krista Blue as an outpatient  5.  Continue Tecfidera  Case discussed with Dr. Cindee Salt, MD [facility] [phone] 04/13/2014,  2:54 PM

## 2014-04-13 NOTE — Progress Notes (Signed)
Patient seen and examined. Admitted after midnight secondary to dizziness. MRI negative for stroke and per neurology recommendations no further stroke work up needed. Patient with hx of MS and spinal stenosis. Please referred to H&P written by Dr. Hal Hope for further info/details on admission.  Plan: -will start meclizine TID PRN -PT for vestibular training -supportive care  Barton Dubois [phone]

## 2014-04-13 NOTE — Progress Notes (Signed)
Pt to rm 5W25 from ED via stretcher, a/o x4, skin W/D/I, pt oriented to rm, call light, bed in lowest position, educated to call for RN for assist.

## 2014-04-14 DIAGNOSIS — E785 Hyperlipidemia, unspecified: Secondary | ICD-10-CM

## 2014-04-14 MED ORDER — DIMETHYL FUMARATE 240 MG PO CPDR
240.0000 mg | DELAYED_RELEASE_CAPSULE | Freq: Two times a day (BID) | ORAL | Status: DC
  Administered 2014-04-14: 240 mg via ORAL
  Filled 2014-04-14: qty 1

## 2014-04-14 MED ORDER — MECLIZINE HCL 25 MG PO TABS: 25.0000 mg | ORAL_TABLET | Freq: Three times a day (TID) | ORAL | Status: DC | PRN

## 2014-04-14 NOTE — Evaluation (Signed)
Physical Therapy Evaluation Patient Details Name: Andrea Cantrell MRN: [id number] DOB: 06-Aug-1950 Today's Date: 04/14/2014   History of Present Illness  Adm s/p episode of dizziness with vertigo. MRI brain negative for acute event  PMHx- multiple sclerosis, hypertension, hyperlipidemia, discoid lupusmultiple sclerosis, hypertension, hyperlipidemia, discoid lupu    Clinical Impression  Patient evaluated by Physical Therapy with no further acute PT needs identified. Difficult to fully discern cause of her vertigo as she has MS and component of anxiety. She does also have tinnitus and feel a more thorough vestibular evaluation at [facility] (with specific equipment not available at bedside) is warranted. All education has been completed and the patient has no further questions. Provided patient with handouts re: vestibular rehab. PT is signing off. Thank you for this referral.     Follow Up Recommendations Outpatient PT (Vestibular Rehab)    Equipment Recommendations  None recommended by PT    Recommendations for Other Services       Precautions / Restrictions Precautions Precautions: Fall      Mobility  Bed Mobility Overal bed mobility: Modified Independent                Transfers Overall transfer level: Independent Equipment used: None             General transfer comment: from regular toilet and chair  Ambulation/Gait Ambulation/Gait assistance: Supervision Ambulation Distance (Feet): 15 Feet (5) Assistive device: None Gait Pattern/deviations: Step-through pattern;Decreased stride length;Wide base of support   Gait velocity interpretation: Below normal speed for age/gender General Gait Details: guarded due to pain and fear of onset of dizziness  Stairs            Wheelchair Mobility    Modified Rankin (Stroke Patients Only)       Balance Overall balance assessment: Needs assistance         Standing balance support: No upper extremity  supported Standing balance-Leahy Scale: Fair                 High Level Balance Comments: unable to turn head while walking due to fear of imbalance/dizziness             Pertinent Vitals/Pain Pain Assessment: Faces Faces Pain Scale: Hurts little more Pain Location: low back with activity Pain Intervention(s): Limited activity within patient's tolerance;Monitored during session;Repositioned    Home Living Family/patient expects to be discharged to:: Private residence 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) Available Help at Discharge: Family;Available PRN/intermittently                  Prior Function Level of Independence: Needs assistance   Gait / Transfers Assistance Needed: independent; limited distances due to back pain  ADL's / Homemaking Assistance Needed: assist with homemaking        Hand Dominance        Extremity/Trunk Assessment   Upper Extremity Assessment: Generalized weakness           Lower Extremity Assessment: Generalized weakness      Cervical / Trunk Assessment: Lordotic  Communication   Communication: No difficulties  Cognition Arousal/Alertness: Awake/alert Behavior During Therapy: WFL for tasks assessed/performed Overall Cognitive Status: Within Functional Limits for tasks assessed                      General Comments      Exercises        Assessment/Plan    PT Assessment All further PT needs can be met in the  next venue of care  PT Diagnosis Difficulty walking (vertigo)   PT Problem List Decreased activity tolerance;Decreased balance;Decreased mobility;Decreased knowledge of precautions;Other (comment) (vertigo)  PT Treatment Interventions     PT Goals (Current goals can be found in the Care Plan section) Acute Rehab PT Goals Patient Stated Goal: decr episodes PT Goal Formulation: All assessment and education complete, DC therapy    Frequency     Barriers to discharge         Co-evaluation               End of Session   Activity Tolerance: Patient limited by fatigue Patient left: in bed;with call bell/phone within reach Nurse Communication:  (OK for d/c from PT perspective; needs [facility] vestibular rehab)         Time: 7711-6579 PT Time Calculation (min) (ACUTE ONLY): 31 min   Charges:   PT Evaluation $Initial PT Evaluation Tier I: 1 Procedure PT Treatments $Therapeutic Activity: 8-22 mins   PT G Codes:        Priyal Musquiz May 10, 2014, 1:17 PM Pager [phone]

## 2014-04-14 NOTE — Progress Notes (Signed)
04/14/14 Patient to be discharged home today, IV site removed, and discharge instructions reviewed with patient.

## 2014-04-14 NOTE — Discharge Summary (Signed)
Physician Discharge Summary  Andrea Cantrell IDP:[id number] DOB: Mar 07, 1950 DOA: 04/12/2014  PCP: Pcp Not In System  Admit date: 04/12/2014 Discharge date: 04/14/2014  Time spent: >30 minutes  Recommendations for Outpatient Follow-up:  1. Reassess BMET to follow electrolytes and renal function 2. Once ready for further evaluation and treatment regarding spinal stenosis will need referral to orthopedic service vs neurosurgery.   Discharge Diagnoses:    Dizziness/lightheadedness    Essential hypertension   Viral Acute bronchitis    HLD   MS   Spinal stenosis      Discharge Condition: stable and improved. Will discharge home with instructions to follow with PCP in 10 days and with prescription for outpatient vestibular rehabilitation  Diet recommendation: heart healthy diet and low calorie diet  Filed Weights   04/13/14 0306  Weight: 100.7 kg (222 lb 0.1 oz)    History of present illness:  64 y.o. female with history of remitting relapsing multiple sclerosis, hypertension, hyperlipidemia and discoid lupus; who presented to the ER because of some onset of dizziness. Patient states since 5 PM last evening patient started getting dizzy and was finding it difficult to walk because of the dizziness. Patient has been recently followed up by neurologist for increasing low back pain and numbness of the lower extremity and was originally planned to have MRI brain and lumbar spine on the day of admisison. In the ER patient also was found to be mildly wheezing. Chest x-ray showed bronchitic features. Patient's wheezing resolved/improved with 1 dose of nebulizer. The Patient denies any chest pain nausea, vomiting, headache, visual symptoms, abdominal pain, diarrhea, fever and chills.  Hospital Course:  1. Dizziness with history of multiple sclerosis relapsing and remitting course - patient had MRI of the brain w/o acute abnormalities or active MS lesions. Per neurology recommendations will hold up  on further work up for stroke or TIA. Will continue Tecfidera for MS; start meclizine PRN and arrange for outpatient vestibular rehabilitation. No abnormalities on telemetry appreciated 2. Bronchitis - patient's wheezing resolved with nebulizer treatment X 1. Patient denies cough, SOB, fever, chills or any other complaints. Will no prescribe antibiotics at this moment. Continue PRN albuterol. 3. Tobacco abuse - tobacco cessation counseling provided; patient contemplating quitting  4. Hypertension - stable and well controlled. Continue Norvasc and low sodium diet. 5. Hyperlipidemia: continue statins. 6. Obesity: advise to follow low calorie diet and to increase exercise 7. Neuropathy: continue cymbalta and neurontin 8. Spinal stenosis at Lumbar space level: options of treatment were discussed by neurologist; patient is not contemplating surgery at this point. Will continue cymbalta and neurontin. Patient to follow with PCP and neurology service as an outpatient ; once ready to decide on tx of spinal stenosis can be referred to orthopedic service vs neurosurgery.   Procedures:  See below for x-ray reports   Consultations:  Neurology   PT for vestibular assessment   Discharge Exam: Filed Vitals:   04/14/14 1400  BP: 151/70  Pulse:   Temp: 98 F (36.7 C)  Resp: 16    General: afebrile, denies CP or SOB. Reports still some lightheadedness/dizziness, but much improve Cardiovascular: S1 and S2, no rubs or gallops Respiratory: CTA bilaterally Abd: soft, NT, no guarding; positive BS Extremities: trace edema bilaterally   Discharge Instructions   Discharge Instructions    Diet - low sodium heart healthy    Complete by:  As directed      Discharge instructions    Complete by:  As directed  Take medications as prescribed Arrange hospital follow up visit with PCP in 10 days Keep yourself well hydrated Attend outpatient rehabilitation for further vestibular training           Current Discharge Medication List    START taking these medications   Details  meclizine (ANTIVERT) 25 MG tablet Take 1 tablet (25 mg total) by mouth 3 (three) times daily as needed for dizziness. Qty: 45 tablet, Refills: 0      CONTINUE these medications which have NOT CHANGED   Details  albuterol (PROVENTIL HFA;VENTOLIN HFA) 108 (90 BASE) MCG/ACT inhaler Inhale 2 puffs into the lungs every 6 (six) hours as needed for shortness of breath. For shortness of breath.    amLODipine (NORVASC) 5 MG tablet Take 5 mg by mouth every morning.     aspirin EC 81 MG tablet Take 162 mg by mouth every evening.     atorvastatin (LIPITOR) 10 MG tablet Take 10 mg by mouth every morning.     baclofen (LIORESAL) 10 MG tablet Take 10 mg by mouth See admin instructions. Takes twice daily but can take extra dose if needed    cholecalciferol (VITAMIN D) 1000 UNITS tablet Take 3,000 Units by mouth every evening.     cyanocobalamin (,VITAMIN B-12,) 1000 MCG/ML injection Inject 1,000 mcg into the muscle every 30 (thirty) days.     Dimethyl Fumarate (TECFIDERA) 240 MG CPDR Take 240 mg by mouth every 12 (twelve) hours.     DULoxetine (CYMBALTA) 60 MG capsule Take 1 capsule (60 mg total) by mouth daily. Qty: 30 capsule, Refills: 11    gabapentin (NEURONTIN) 300 MG capsule Take 2 capsules (600 mg total) by mouth 3 (three) times daily. Qty: 180 capsule, Refills: 11    ibuprofen (ADVIL,MOTRIN) 200 MG tablet Take 200-600 mg by mouth every 6 (six) hours as needed (pain).     traMADol (ULTRAM) 50 MG tablet Take 1 tablet (50 mg total) by mouth every 6 (six) hours as needed. Qty: 60 tablet, Refills: 5       No Known Allergies   The results of significant diagnostics from this hospitalization (including imaging, microbiology, ancillary and laboratory) are listed below for reference.    Significant Diagnostic Studies: Dg Chest 2 View  04/12/2014   CLINICAL DATA:  64 year old female with new onset of  dizziness, lightheadedness and anxiety with sweating and nausea since 5 p.m. today.  EXAM: CHEST  2 VIEW  COMPARISON:  Chest x-ray 07/05/2013.  FINDINGS: Mild diffuse peribronchial cuffing. Lung volumes are normal. No consolidative airspace disease. No pleural effusions. No pneumothorax. No pulmonary nodule or mass noted. Pulmonary vasculature and the cardiomediastinal silhouette are within normal limits. Atherosclerotic calcifications in the thoracic aorta.  IMPRESSION: 1. Mild diffuse peribronchial cuffing, concerning for acute bronchitis. 2. Atherosclerosis.   Electronically Signed   By: Vinnie Langton M.D.   On: 04/12/2014 19:45   Mr Jeri Cos JG Contrast  04/13/2014   CLINICAL DATA:  Multiple sclerosis.  Dizziness.  EXAM: MRI HEAD WITHOUT AND WITH CONTRAST  TECHNIQUE: Multiplanar, multiecho pulse sequences of the brain and surrounding structures were obtained without and with intravenous contrast.  CONTRAST:  31mL MULTIHANCE GADOBENATE DIMEGLUMINE 529 MG/ML IV SOLN  COMPARISON:  08/02/2013  FINDINGS: Partially empty sella is again noted. There is no evidence of acute infarct, intracranial hemorrhage, mass, midline shift, or extra-axial fluid collection. Ventricles and sulci are unchanged and within normal limits for age. Again seen are numerous foci of T2 hyperintensity in the juxtacortical,  subcortical, and periventricular cerebral white matter bilaterally, including multiple lesions oriented perpendicularly to the lateral ventricles. Abnormal T2 hyperintensity is also again seen along the callososeptal interface and in the brainstem. Overall lesion burden appears unchanged. No enhancing lesions are identified.  Orbits are unremarkable. Paranasal sinuses and mastoid air cells are clear. Major intracranial vascular flow voids are preserved.  IMPRESSION: 1. No evidence of acute intracranial abnormality. 2. Unchanged white matter lesions consistent with multiple sclerosis. No enhancing lesions seen to suggest  acute demyelination.   Electronically Signed   By: Logan Bores   On: 04/13/2014 08:10   Mr Lumbar Spine Wo Contrast  04/13/2014   CLINICAL DATA:  Increasing low back pain and numbness of the right lower extremity. Multiple sclerosis.  EXAM: MRI LUMBAR SPINE WITHOUT CONTRAST  TECHNIQUE: Multiplanar, multisequence MR imaging of the lumbar spine was performed. No intravenous contrast was administered.  COMPARISON:  CT abdomen and pelvis 01/06/2010  FINDINGS: There is new grade 1 anterolisthesis of L4 on L5 measuring 4 mm, facet mediated. Disc desiccation is present from L3-4 to L5-S1 with mild disc space narrowing at L3-4 and mild-to-moderate narrowing at L4-5 and L5-S1. Disc degeneration is also again seen at T11-12 with mild degenerative marrow changes and a small T12 superior endplate Schmorl's node noted. Very mild degenerative endplate marrow changes are also present in the lumbar spine.  There is mild marrow edema about the L4-5 facet joints likely reflecting chronic/ acute on chronic facet arthritis. There is mild narrowing of the mid to lower lumbar spinal canal due on a congenital basis due to short pedicles. Conus medullaris is normal in signal and terminates at the mid L2 level. Paraspinal soft tissues are unremarkable.  L1-2: Mild facet hypertrophy without disc herniation or stenosis.  L2-3:  Mild facet hypertrophy without disc herniation or stenosis.  L3-4: Mild disc bulging and moderate facet and ligamentum flavum hypertrophy in the setting of congenitally short pedicles result in moderate spinal stenosis and mild right and moderate left neural foraminal stenosis. There is a 7 x 4 mm cyst in the left ligamentum flavum which mildly contributes to the spinal canal narrowing.  L4-5: Listhesis with disc uncovering and severe facet and ligamentum flavum hypertrophy result in severe spinal stenosis and mild-to-moderate bilateral neural foraminal stenosis. There are small bilateral facet joint effusions.   L5-S1: Mild diffuse disc bulging and moderate facet hypertrophy result in mild bilateral neural foraminal stenosis without spinal stenosis.  IMPRESSION: 1. Severe facet arthrosis at L4-5 with new grade 1 anterolisthesis and severe spinal stenosis. 2. Moderate multifactorial spinal stenosis and moderate left neural foraminal stenosis at L3-4.   Electronically Signed   By: Logan Bores   On: 04/13/2014 08:01   Labs: Basic Metabolic Panel:  Recent Labs Lab 04/12/14 2012 04/13/14 0804  NA 137 137  K 4.4 3.9  CL 101 105  CO2  --  26  GLUCOSE 116* 93  BUN 10 <5*  CREATININE 0.60 0.70  CALCIUM  --  8.3*   Liver Function Tests:  Recent Labs Lab 04/13/14 0804  AST 17  ALT 15  ALKPHOS 98  BILITOT 0.4  PROT 6.3  ALBUMIN 3.2*   CBC:  Recent Labs Lab 04/12/14 2002 04/12/14 2012 04/13/14 0804  WBC 6.4  --  6.4  NEUTROABS 5.0  --  4.8  HGB 13.0 14.6 12.5  HCT 39.7 43.0 38.7  MCV 80.5  --  80.8  PLT 408*  --  399   BNP (last 3  results)  Recent Labs  04/12/14 2002  BNP 32.8    Signed:  Barton Dubois  [facility] 04/14/2014, 3:50 PM

## 2014-04-15 NOTE — Progress Notes (Signed)
UR Completed.  [phone]  

## 2014-04-17 ENCOUNTER — Ambulatory Visit (INDEPENDENT_AMBULATORY_CARE_PROVIDER_SITE_OTHER): Payer: Medicare Other | Admitting: Neurology

## 2014-04-17 DIAGNOSIS — G35 Multiple sclerosis: Secondary | ICD-10-CM

## 2014-04-17 DIAGNOSIS — M5416 Radiculopathy, lumbar region: Secondary | ICD-10-CM

## 2014-04-17 DIAGNOSIS — M5442 Lumbago with sciatica, left side: Secondary | ICD-10-CM

## 2014-04-17 NOTE — Progress Notes (Signed)
[facility]  PATIENT: Andrea Cantrell DOB: 1950/04/30  Ms Giannotti, is a 64 year old female returns for followup of  relapsing remitting multiple sclerosis   She has a history of multiple sclerosis since 1990s, also with past medical history of hypertension, hyperlipidemia, fibromyalgia, and discoid lupus in the 1990s. She rarely has a flareup from her lupus   She has episodes of gait difficulty, in 2006, took her about 6 months to recover.  From then on, she kept on having episodes of worsening gait difficulty, blurry vision. Diagnosis was made in 2013, based upon abnormal MRI.  MRI scan of the brain shows multiple periventricular lesions solitary enhancing left frontal subcortical lesion with multiple nonenhancing periventricular and subcortical hypointense lesions consistent with myelinating disease.   MRI of the cervical spine with mild degenerative changes but no enhancing lesions are noted.   She was started on tecfidera in October 2013 and has tolerated the medication extremely well. Initially she thought she developed a rash but apparently that was due to a GI virus and that has disappeared.   UPDATE June 10th 2015:  She still complains of low back, lower extremity pain, muscle spasm at her toes, she went to hospital in Jul 05 2013, complains of chest tightness, chest pain, near syncope episode, EKG was normal, chest x-ray was normal, laboratory showed normal CBC, CMP with exception of mild elevated alkaline phosphate 140, she was diagnosed with dehydration, which has improved with IV fluid, and pain medications, normal nuclear stress test in October 2015  She now complains of left neck pain, bilateral lower extremity pain  UPDATE Feb 8th 2016: She now complains of right knee pain, right foot numbness since  Mar 18 2014, going up her right leg, when she put pressure on her right foot, she noticed right toe pain, going up her right leg,  She also complains of low  back pain, going down her left hip and left leg, she also has right arm intermittent sharp pain.  She went to ED in Dec 27th 2015 for worsening left side low back pain, shooting pain to left leg.  UPDATE March 1st 2016: She was admitted to hospital April 13 2014, for acute onset of dizziness, worsening gait difficulty, which has improved, with physical therapy,  I have reviewed the repeat MRI of the brain February 2016 with without contrast, Partially empty sella,numerous foci of T2 hyperintensity in the juxtacortical, subcortical, and periventricular cerebral white matter bilaterally, including multiple lesions oriented perpendicularly to the lateral ventricles. Abnormal T2 hyperintensity is also again seen along the callososeptal interface and in the brainstem. Overall lesion burden appears unchanged. No enhancing lesions are identified  MRI lumbar spine,Severe facet arthrosis at L4-5 with new grade 1 anterolisthesis and severe spinal stenosis. Moderate multifactorial spinal stenosis and moderate left neural foraminal stenosis at L3-4.  Today's electrodiagnostic study did show chronic mild right lumbar radiculopathy, consistent with above MRI findings,  She has intermittent moderate low back pain, persistent right foot numbness, mild gait difficulty, no bowel and bladder incontinence, I will refer her to pain management,  REVIEW OF SYSTEMS: Full 14 system review of systems performed and notable only for those listed, all others are neg:    ALLERGIES: No Known Allergies  HOME MEDICATIONS: Outpatient Prescriptions Prior to Visit  Medication Sig Dispense Refill  . albuterol (PROVENTIL HFA;VENTOLIN HFA) 108 (90 BASE) MCG/ACT inhaler Inhale 2 puffs into the lungs every 6 (six) hours as needed for shortness of breath. For  shortness of breath.    Marland Kitchen amLODipine (NORVASC) 5 MG tablet Take 5 mg by mouth every morning.     Marland Kitchen aspirin EC 81 MG tablet Take 162 mg by mouth every evening.     Marland Kitchen  atorvastatin (LIPITOR) 10 MG tablet Take 10 mg by mouth every morning.     . baclofen (LIORESAL) 10 MG tablet Take 10 mg by mouth See admin instructions. Takes twice daily but can take extra dose if needed    . cholecalciferol (VITAMIN D) 1000 UNITS tablet Take 3,000 Units by mouth every evening.     . cyanocobalamin (,VITAMIN B-12,) 1000 MCG/ML injection Inject 1,000 mcg into the muscle every 30 (thirty) days.     . Dimethyl Fumarate (TECFIDERA) 240 MG CPDR Take 240 mg by mouth every 12 (twelve) hours.     . DULoxetine (CYMBALTA) 60 MG capsule Take 1 capsule (60 mg total) by mouth daily. 30 capsule 11  . gabapentin (NEURONTIN) 300 MG capsule Take 2 capsules (600 mg total) by mouth 3 (three) times daily. 180 capsule 11  . ibuprofen (ADVIL,MOTRIN) 200 MG tablet Take 200-600 mg by mouth every 6 (six) hours as needed (pain).     . meclizine (ANTIVERT) 25 MG tablet Take 1 tablet (25 mg total) by mouth 3 (three) times daily as needed for dizziness. 45 tablet 0  . traMADol (ULTRAM) 50 MG tablet Take 1 tablet (50 mg total) by mouth every 6 (six) hours as needed. (Patient taking differently: Take 100 mg by mouth 2 (two) times daily. ) 60 tablet 5   No facility-administered medications prior to visit.    PAST MEDICAL HISTORY: Past Medical History  Diagnosis Date  . Essential hypertension   . Fibromyalgia   . Discoid lupus   . MS (multiple sclerosis)   . Hyperlipidemia with target LDL less than 130   . Obesity (BMI 35.0-39.9 without comorbidity)   . Heavy smoker (more than 20 cigarettes per day)     Was able to stop for 11 years but restarted about 10 years ago    PAST SURGICAL HISTORY: Past Surgical History  Procedure Laterality Date  . Abdominal hysterectomy  1995  . Cholecystectomy  1994  . Cesarean section  [place]  . Breast surgery Left     tissue removal    FAMILY HISTORY: Family History  Problem Relation Age of Onset  . Cancer Mother   . Cancer Father   . Diabetes Brother     . Diabetes Maternal Grandmother     SOCIAL HISTORY: History   Social History  . Marital Status: Legally Separated    Spouse Name: N/A  . Number of Children: 2  . Years of Education: 12+   Occupational History  . Not on file.   Social History Main Topics  . Smoking status: Current Every Day Smoker -- 1.00 packs/day    Types: Cigarettes  . Smokeless tobacco: Never Used  . Alcohol Use: No  . Drug Use: No  . Sexual Activity: Not on file   Other Topics Concern  . Not on file   Social History Narrative   Patient lives at home with granddaughter her her 2 children.    Patient is separated. Patient has 2 children.    Patient has some college. Patient is on Disability.    Smokes roughly one pack a day. No alcohol.     PHYSICAL EXAM  There were no vitals filed for this visit. There is no weight on  file to calculate BMI. PHYSICAL EXAMNIATION:  Gen: NAD, conversant, well nourised, obese, well groomed                     Cardiovascular: Regular rate rhythm, no peripheral edema, warm, nontender. Eyes: Conjunctivae clear without exudates or hemorrhage Neck: Supple, no carotid bruise. Pulmonary: Clear to auscultation bilaterally   NEUROLOGICAL EXAM:  MENTAL STATUS: Speech:    Speech is normal; fluent and spontaneous with normal comprehension.  Cognition:    The patient is oriented to person, place, and time;     recent and remote memory intact;     language fluent;     normal attention, concentration,     fund of knowledge.  CRANIAL NERVES: CN II: Visual fields are full to confrontation. Fundoscopic exam is normal with sharp discs and no vascular changes. Venous pulsations are present bilaterally. Pupils are 4 mm and briskly reactive to light. Visual acuity is 20/20 bilaterally. CN III, IV, VI: extraocular movement are normal. No ptosis. CN V: Facial sensation is intact to pinprick in all 3 divisions bilaterally. Corneal responses are intact.  CN VII: Face is symmetric  with normal eye closure and smile. CN VIII: Hearing is normal to rubbing fingers CN IX, X: Palate elevates symmetrically. Phonation is normal. CN XI: Head turning and shoulder shrug are intact CN XII: Tongue is midline with normal movements and no atrophy.  MOTOR: There is no pronator drift of out-stretched arms. Muscle bulk and tone are normal. Muscle strength is normal.   Shoulder abduction Shoulder external rotation Elbow flexion Elbow extension Wrist flexion Wrist extension Finger abduction Hip flexion Knee flexion Knee extension Ankle dorsi flexion Ankle plantar flexion  R 5 5 5 5 5 5 5 5 5 5 5 5   L 5 5 5 5 5 5 5 5 5 5 5 5     REFLEXES: Reflexes are 2+ and symmetric at the biceps, triceps, knees, and ankles. Plantar responses are flexor.  SENSORY: Light touch, pinprick, position sense, and vibration sense are intact in fingers and toes.  COORDINATION: Rapid alternating movements and fine finger movements are intact. There is no dysmetria on finger-to-nose and heel-knee-shin. There are no abnormal or extraneous movements.   GAIT/STANCE: Cautious gait, mild difficulty with heel and toe walking, mild to moderate tandem gait difficulty Romberg is absent.    DIAGNOSTIC DATA (LABS, IMAGING, TESTING) - I reviewed patient records, labs, notes, testing and imaging myself where available.  Lab Results  Component Value Date   WBC 6.4 04/13/2014   HGB 12.5 04/13/2014   HCT 38.7 04/13/2014   MCV 80.8 04/13/2014   PLT 399 04/13/2014      Component Value Date/Time   NA 137 04/13/2014 0804   NA 140 01/25/2013 1112   K 3.9 04/13/2014 0804   CL 105 04/13/2014 0804   CO2 26 04/13/2014 0804   GLUCOSE 93 04/13/2014 0804   GLUCOSE 94 01/25/2013 1112   BUN <5* 04/13/2014 0804   BUN 14 01/25/2013 1112   CREATININE 0.70 04/13/2014 0804   CALCIUM 8.3* 04/13/2014 0804   PROT 6.3 04/13/2014 0804   PROT 7.2 01/25/2013 1112   ALBUMIN 3.2* 04/13/2014 0804   AST 17 04/13/2014 0804   ALT  15 04/13/2014 0804   ALKPHOS 98 04/13/2014 0804   BILITOT 0.4 04/13/2014 0804   GFRNONAA 90* 04/13/2014 0804   GFRAA >90 04/13/2014 0804     ASSESSMENT AND PLAN  64 y.o. year old female with relapsing remitting  multiple sclerosis, is taking Tecfidera since Oct 2013, now complains worsening low back pain, radiating pain to her right leg, right leg numbness,   1,  Multiple sclerosis, recurrent dizziness, improved,  No active lesions on repeat MRI of the brain, keep current dose of Tecfidera. 2. Right lumbar radiculopathy, chronic, refer her to pain management, moderate exercise, physical therapy 3. Return to clinic in 3 months  Marcial Pacas, Ph.D. M.D.  [facility] [address], [place], [place] [address] [phone]

## 2014-04-17 NOTE — Progress Notes (Signed)
[facility]  PATIENT: Andrea Cantrell DOB: 1950-08-18  Andrea Cantrell, is a 64 year old female returns for followup of  relapsing remitting multiple sclerosis   She has a history of multiple sclerosis since 1990s, also with past medical history of hypertension, hyperlipidemia, fibromyalgia, and discoid lupus in the 1990s. She rarely has a flareup from her lupus   She has episodes of gait difficulty, in 2006, took her about 6 months to recover.  From then on, she kept on having episodes of worsening gait difficulty, blurry vision. Diagnosis was made in 2013, based upon abnormal MRI.  MRI scan of the brain shows multiple periventricular lesions solitary enhancing left frontal subcortical lesion with multiple nonenhancing periventricular and subcortical hypointense lesions consistent with myelinating disease.   MRI of the cervical spine with mild degenerative changes but no enhancing lesions are noted.   She was started on tecfidera in October 2013 and has tolerated the medication extremely well. Initially she thought she developed a rash but apparently that was due to a GI virus and that has disappeared.   UPDATE June 10th 2015:  She still complains of low back, lower extremity pain, muscle spasm at her toes, she went to hospital in Jul 05 2013, complains of chest tightness, chest pain, near syncope episode, EKG was normal, chest x-ray was normal, laboratory showed normal CBC, CMP with exception of mild elevated alkaline phosphate 140, she was diagnosed with dehydration, which has improved with IV fluid, and pain medications, normal nuclear stress test in October 2015  She now complains of left neck pain, bilateral lower extremity pain  UPDATE Feb 8th 2016: She now complains of right knee pain, right foot numbness since  Mar 18 2014, going up her right leg, when she put pressure on her right foot, she noticed right toe pain, going up her right leg,  She also complains of low  back pain, going down her left hip and left leg, she also has right arm intermittent sharp pain.  She went to ED in Dec 27th 2015 for worsening left side low back pain, shooting pain to left leg.   REVIEW OF SYSTEMS: Full 14 system review of systems performed and notable only for those listed, all others are neg:    ALLERGIES: No Known Allergies  HOME MEDICATIONS: Outpatient Prescriptions Prior to Visit  Medication Sig Dispense Refill  . albuterol (PROVENTIL HFA;VENTOLIN HFA) 108 (90 BASE) MCG/ACT inhaler Inhale 2 puffs into the lungs every 6 (six) hours as needed for shortness of breath. For shortness of breath.    Marland Kitchen amLODipine (NORVASC) 5 MG tablet Take 5 mg by mouth every morning.     Marland Kitchen aspirin EC 81 MG tablet Take 162 mg by mouth every evening.     Marland Kitchen atorvastatin (LIPITOR) 10 MG tablet Take 10 mg by mouth every morning.     . baclofen (LIORESAL) 10 MG tablet Take 10 mg by mouth See admin instructions. Takes twice daily but can take extra dose if needed    . cholecalciferol (VITAMIN D) 1000 UNITS tablet Take 3,000 Units by mouth every evening.     . cyanocobalamin (,VITAMIN B-12,) 1000 MCG/ML injection Inject 1,000 mcg into the muscle every 30 (thirty) days.     . Dimethyl Fumarate (TECFIDERA) 240 MG CPDR Take 240 mg by mouth every 12 (twelve) hours.     . DULoxetine (CYMBALTA) 60 MG capsule Take 1 capsule (60 mg total) by mouth daily. 30 capsule 11  . gabapentin (  NEURONTIN) 300 MG capsule Take 2 capsules (600 mg total) by mouth 3 (three) times daily. 180 capsule 11  . ibuprofen (ADVIL,MOTRIN) 200 MG tablet Take 200-600 mg by mouth every 6 (six) hours as needed (pain).     . meclizine (ANTIVERT) 25 MG tablet Take 1 tablet (25 mg total) by mouth 3 (three) times daily as needed for dizziness. 45 tablet 0  . traMADol (ULTRAM) 50 MG tablet Take 1 tablet (50 mg total) by mouth every 6 (six) hours as needed. (Patient taking differently: Take 100 mg by mouth 2 (two) times daily. ) 60 tablet 5    No facility-administered medications prior to visit.    PAST MEDICAL HISTORY: Past Medical History  Diagnosis Date  . Essential hypertension   . Fibromyalgia   . Discoid lupus   . Andrea (multiple sclerosis)   . Hyperlipidemia with target LDL less than 130   . Obesity (BMI 35.0-39.9 without comorbidity)   . Heavy smoker (more than 20 cigarettes per day)     Was able to stop for 11 years but restarted about 10 years ago    PAST SURGICAL HISTORY: Past Surgical History  Procedure Laterality Date  . Abdominal hysterectomy  1995  . Cholecystectomy  1994  . Cesarean section  [place]  . Breast surgery Left     tissue removal    FAMILY HISTORY: Family History  Problem Relation Age of Onset  . Cancer Mother   . Cancer Father   . Diabetes Brother   . Diabetes Maternal Grandmother     SOCIAL HISTORY: History   Social History  . Marital Status: Legally Separated    Spouse Name: N/A  . Number of Children: 2  . Years of Education: 12+   Occupational History  . Not on file.   Social History Main Topics  . Smoking status: Current Every Day Smoker -- 1.00 packs/day    Types: Cigarettes  . Smokeless tobacco: Never Used  . Alcohol Use: No  . Drug Use: No  . Sexual Activity: Not on file   Other Topics Concern  . Not on file   Social History Narrative   Patient lives at home with granddaughter her her 2 children.    Patient is separated. Patient has 2 children.    Patient has some college. Patient is on Disability.    Smokes roughly one pack a day. No alcohol.     PHYSICAL EXAM  There were no vitals filed for this visit. There is no weight on file to calculate BMI.  Generalized: Well developed, in no acute distress  Head: normocephalic and atraumatic,. Oropharynx benign  Neck: Supple, no carotid bruits  Cardiac: Regular rate rhythm, no murmur    Neurological examination   Mentation: Alert oriented to time, place, history taking. Follows all commands  speech and language fluent  Cranial nerve II-XII: Fundoscopic exam reveals sharp disc margins.Pupils were equal round reactive to light extraocular movements were full, visual field were full on confrontational test. Facial sensation and strength were normal. hearing was intact to finger rubbing bilaterally. Uvula tongue midline. head turning and shoulder shrug were normal and symmetric.Tongue protrusion into cheek strength was normal. Motor: normal bulk and tone, full strength in the BUE, BLE, fine finger movements normal, no pronator drift. No focal weakness Sensory: normal and symmetric to light touch, pinprick, and  vibration  Coordination: finger-nose-finger, heel-to-shin bilaterally, no dysmetria Reflexes: Brachioradialis 2/2, biceps 2/2, triceps 2/2, patellar 2/2, Achilles 2/2, plantar responses were  flexor bilaterally. Gait and Station: Rising up from seated position without assistance, cautious gait, moderate stride   DIAGNOSTIC DATA (LABS, IMAGING, TESTING) - I reviewed patient records, labs, notes, testing and imaging myself where available.  Lab Results  Component Value Date   WBC 6.4 04/13/2014   HGB 12.5 04/13/2014   HCT 38.7 04/13/2014   MCV 80.8 04/13/2014   PLT 399 04/13/2014      Component Value Date/Time   NA 137 04/13/2014 0804   NA 140 01/25/2013 1112   K 3.9 04/13/2014 0804   CL 105 04/13/2014 0804   CO2 26 04/13/2014 0804   GLUCOSE 93 04/13/2014 0804   GLUCOSE 94 01/25/2013 1112   BUN <5* 04/13/2014 0804   BUN 14 01/25/2013 1112   CREATININE 0.70 04/13/2014 0804   CALCIUM 8.3* 04/13/2014 0804   PROT 6.3 04/13/2014 0804   PROT 7.2 01/25/2013 1112   ALBUMIN 3.2* 04/13/2014 0804   AST 17 04/13/2014 0804   ALT 15 04/13/2014 0804   ALKPHOS 98 04/13/2014 0804   BILITOT 0.4 04/13/2014 0804   GFRNONAA 90* 04/13/2014 0804   GFRAA >90 04/13/2014 0804     ASSESSMENT AND PLAN  64 y.o. year old female with relapsing remitting multiple sclerosis, is taking  Tecfidera since Oct 2013, now complains worsening low back pain, radiating pain to her right leg, right leg numbness,   This could indicating Andrea flareups, with superimposed right lumbar radiculopathy  Repeat MRI of brain with and without contrast MRI of lumbar  EMG nerve conduction study     Marcial Pacas, Ph.D. M.D.  [facility] [address] [place], [place] [address] [phone]

## 2014-04-17 NOTE — Procedures (Signed)
   NCS (NERVE CONDUCTION STUDY) WITH EMG (ELECTROMYOGRAPHY) REPORT   STUDY DATE: April 17 2014 PATIENT NAME: Andrea Cantrell DOB: 08-Mar-1950 MRN: [id number]    TECHNOLOGIST: Laretta Alstrom ELECTROMYOGRAPHER: Marcial Pacas M.D.  CLINICAL INFORMATION:  64 year old female, with history of multiple sclerosis, presenting with chronic low back pain, radiating pain from right lower back to right lower extremity.  FINDINGS: NERVE CONDUCTION STUDY: Bilateral peroneal sensory responses were normal. Bilateral peroneal to EDB, and tibial motor responses were normal. Bilateral tibial H reflexes were normal and symmetric.  NEEDLE ELECTROMYOGRAPHY: Selected needle examination was performed at right lower extremity muscles, right lumbosacral paraspinal muscles.  Right tibialis anterior, tibialis posterior, vastus lateralis, peroneal longus:  Normal insertional activity, no spontaneous activity, mild enlargement motor unit potential, with slightly decreased recruitment patterns.  There was no spontaneous activity at right lumbosacral paraspinal muscles, right L4, L5, S1.  IMPRESSION:   This is a mild abnormal study. There was electrodiagnostic evidence of mild chronic right lumbar radiculopathy, there was no evidence of large fiber peripheral neuropathy.  INTERPRETING PHYSICIAN:   Marcial Pacas M.D. Ph.D. [facility] [address], [place],  [address] [phone]

## 2014-04-26 ENCOUNTER — Ambulatory Visit: Payer: Medicare Other | Admitting: Neurology

## 2014-05-02 ENCOUNTER — Ambulatory Visit: Payer: Medicare Other | Admitting: Rehabilitative and Restorative Service Providers"

## 2014-05-02 ENCOUNTER — Other Ambulatory Visit: Payer: Self-pay | Admitting: Physical Medicine and Rehabilitation

## 2014-05-02 DIAGNOSIS — M25511 Pain in right shoulder: Secondary | ICD-10-CM

## 2014-05-07 ENCOUNTER — Emergency Department (HOSPITAL_COMMUNITY)
Admission: EM | Admit: 2014-05-07 | Discharge: 2014-05-08 | Disposition: A | Discharge status: Home / Self Care | Payer: Medicare Other | Attending: Emergency Medicine | Admitting: Emergency Medicine

## 2014-05-07 ENCOUNTER — Encounter (HOSPITAL_COMMUNITY): Payer: Self-pay | Admitting: Emergency Medicine

## 2014-05-07 DIAGNOSIS — S40011D Contusion of right shoulder, subsequent encounter: Secondary | ICD-10-CM | POA: Insufficient documentation

## 2014-05-07 DIAGNOSIS — Z7982 Long term (current) use of aspirin: Secondary | ICD-10-CM | POA: Insufficient documentation

## 2014-05-07 DIAGNOSIS — E785 Hyperlipidemia, unspecified: Secondary | ICD-10-CM | POA: Insufficient documentation

## 2014-05-07 DIAGNOSIS — E669 Obesity, unspecified: Secondary | ICD-10-CM | POA: Insufficient documentation

## 2014-05-07 DIAGNOSIS — G35 Multiple sclerosis: Secondary | ICD-10-CM | POA: Diagnosis not present

## 2014-05-07 DIAGNOSIS — I10 Essential (primary) hypertension: Secondary | ICD-10-CM | POA: Insufficient documentation

## 2014-05-07 DIAGNOSIS — X58XXXD Exposure to other specified factors, subsequent encounter: Secondary | ICD-10-CM | POA: Diagnosis not present

## 2014-05-07 DIAGNOSIS — M797 Fibromyalgia: Secondary | ICD-10-CM | POA: Insufficient documentation

## 2014-05-07 DIAGNOSIS — S4991XD Unspecified injury of right shoulder and upper arm, subsequent encounter: Secondary | ICD-10-CM | POA: Diagnosis present

## 2014-05-07 DIAGNOSIS — Z79899 Other long term (current) drug therapy: Secondary | ICD-10-CM | POA: Insufficient documentation

## 2014-05-07 DIAGNOSIS — T148XXA Other injury of unspecified body region, initial encounter: Secondary | ICD-10-CM

## 2014-05-07 DIAGNOSIS — Z720 Tobacco use: Secondary | ICD-10-CM | POA: Diagnosis not present

## 2014-05-07 NOTE — ED Notes (Addendum)
Pt reports she was recently dx with spinal stenosis and has developed bruising to right arm. Pt states she noticed bruise on Friday. Pt reports she does take Aspirin.

## 2014-05-07 NOTE — Discharge Instructions (Signed)
Please follow-up with your primary care provider for further evaluation. Please follow-up with your my study on Wednesday.

## 2014-05-07 NOTE — ED Provider Notes (Signed)
CSN: [id number]     Arrival date & time 05/07/14  1946 History   First MD Initiated Contact with Patient 05/07/14 2227     Chief Complaint  Patient presents with  . Bleeding/Bruising   HPI  64 year old female presents today with bruising of her right arm. She reports one week ago she injured her right shoulder while reaching behind her back. She states that 2 days later she noticed bruising down the distal aspect of the arm. Patient reports that she was seen by pain management doctor who ordered an MRI for the shoulder. Patient states that she is not taking any blood thinners/anticoagulants. She reports minor tenderness to the area of bruising. Patient reports that her MRIs on Wednesday with plans follow-up with her pain management doctor after. She presents significant past medical history of spinal stenosis. Patient reports shoulder pain is worse with reaching across her body or activities above her head. She denies loss of distal sensation, strength, profusion. She has no other complaints in addition to those listed above.  Past Medical History  Diagnosis Date  . Essential hypertension   . Fibromyalgia   . Discoid lupus   . MS (multiple sclerosis)   . Hyperlipidemia with target LDL less than 130   . Obesity (BMI 35.0-39.9 without comorbidity)   . Heavy smoker (more than 20 cigarettes per day)     Was able to stop for 11 years but restarted about 10 years ago   Past Surgical History  Procedure Laterality Date  . Abdominal hysterectomy  1995  . Cholecystectomy  1994  . Cesarean section  [place]  . Breast surgery Left     tissue removal   Family History  Problem Relation Age of Onset  . Cancer Mother   . Cancer Father   . Diabetes Brother   . Diabetes Maternal Grandmother    History  Substance Use Topics  . Smoking status: Current Every Day Smoker -- 1.00 packs/day    Types: Cigarettes  . Smokeless tobacco: Never Used  . Alcohol Use: No   OB History    No data  available     Review of Systems  All other systems reviewed and are negative.  Allergies  Review of patient's allergies indicates no known allergies.  Home Medications   Prior to Admission medications   Medication Sig Start Date End Date Taking? Authorizing Provider  albuterol (PROVENTIL HFA;VENTOLIN HFA) 108 (90 BASE) MCG/ACT inhaler Inhale 2 puffs into the lungs every 6 (six) hours as needed for wheezing or shortness of breath (wheezing and shortness of breath). For shortness of breath.   Yes Historical Provider, MD  amLODipine (NORVASC) 5 MG tablet Take 5 mg by mouth every morning.    Yes Historical Provider, MD  aspirin EC 81 MG tablet Take 162 mg by mouth every evening.    Yes Historical Provider, MD  atorvastatin (LIPITOR) 10 MG tablet Take 10 mg by mouth every morning.    Yes Historical Provider, MD  baclofen (LIORESAL) 10 MG tablet Take 10 mg by mouth daily.    Yes Historical Provider, MD  Diclofenac 18 MG CAPS Take 1 capsule by mouth at bedtime.   Yes Historical Provider, MD  Dimethyl Fumarate (TECFIDERA) 240 MG CPDR Take 240 mg by mouth every 12 (twelve) hours.    Yes Historical Provider, MD  DULoxetine (CYMBALTA) 60 MG capsule Take 1 capsule (60 mg total) by mouth daily. 03/26/14  Yes Marcial Pacas, MD  gabapentin (NEURONTIN) 300 MG  capsule Take 2 capsules (600 mg total) by mouth 3 (three) times daily. 03/26/14  Yes Marcial Pacas, MD  ibuprofen (ADVIL,MOTRIN) 200 MG tablet Take 800 mg by mouth every 6 (six) hours as needed for moderate pain (right arm pain).    Yes Historical Provider, MD  meclizine (ANTIVERT) 25 MG tablet Take 1 tablet (25 mg total) by mouth 3 (three) times daily as needed for dizziness. 04/14/14  Yes Barton Dubois, MD  Topiramate ER 50 MG CP24 Take 1 capsule by mouth at bedtime.   Yes Historical Provider, MD  traMADol (ULTRAM) 50 MG tablet Take 1 tablet (50 mg total) by mouth every 6 (six) hours as needed. Patient taking differently: Take 100 mg by mouth 2 (two) times  daily.  03/26/14  Yes Marcial Pacas, MD  VITAMIN D, CHOLECALCIFEROL, PO Take 3,000 Units by mouth daily.   Yes Historical Provider, MD   BP 127/66 mmHg  Pulse 83  Temp(Src) 98.1 F (36.7 C) (Oral)  Resp 16  SpO2 95% Physical Exam  Constitutional: She is oriented to person, place, and time. She appears well-developed and well-nourished.  HENT:  Head: Normocephalic and atraumatic.  Eyes: Conjunctivae are normal. Pupils are equal, round, and reactive to light. Right eye exhibits no discharge. Left eye exhibits no discharge. No scleral icterus.  Neck: Normal range of motion. No JVD present. No tracheal deviation present.  Cardiovascular: Normal rate, regular rhythm and normal heart sounds.  Exam reveals no gallop and no friction rub.   No murmur heard. Pulmonary/Chest: Effort normal and breath sounds normal. No stridor.  Musculoskeletal:  Hematoma to the proximal lateral aspect of right upper extremity nontender  Neurological: She is alert and oriented to person, place, and time. Coordination normal.  Psychiatric: She has a normal mood and affect. Her behavior is normal. Judgment and thought content normal.  Nursing note and vitals reviewed.  ED Course  Procedures (including critical care time) Labs Review Labs Reviewed - No data to display  Imaging Review No results found.   EKG Interpretation None     MDM   Final diagnoses:  Hematoma   Patient presents with hematoma to right proximal lateral extremity. This is likely a result from her shoulder injury. No concern for further evaluation or management at this time. Patient is follow-up MRI for her shoulder on Wednesday with follow-up evaluation. He is instructed monitor for new or worsening signs or symptoms including swelling to the distal extremity, pain, decreased perfusion, or any other abnormal findings. Return if any of the above present.    Okey Regal, PA-C 05/07/14 2358  Okey Regal, PA-C 05/08/14 0454  Linton Flemings, MD 05/08/14 [phone]

## 2014-05-08 NOTE — ED Notes (Signed)
Awake. Verbally responsive. A/O x4. Resp even and unlabored. No audible adventitious breath sounds noted. ABC's intact.  

## 2014-05-08 NOTE — ED Notes (Signed)
Awake. Verbally responsive. A/O x4. Resp even and unlabored. No audible adventitious breath sounds noted. ABC's intact. Pt reported bruise to rt inner upper arm. Palpated hard nodule to rt biceps area with fading bruising noted to some areas and deep bruising to other. Pt reported LROM. (+)PMS, CRT brisk. Denies injury/trauma. No deformity noted.

## 2014-05-09 ENCOUNTER — Ambulatory Visit
Admission: RE | Admit: 2014-05-09 | Discharge: 2014-05-09 | Disposition: A | Discharge status: Home / Self Care | Payer: Medicare Other | Source: Ambulatory Visit | Attending: Physical Medicine and Rehabilitation | Admitting: Physical Medicine and Rehabilitation

## 2014-05-09 DIAGNOSIS — M25511 Pain in right shoulder: Secondary | ICD-10-CM

## 2014-05-23 ENCOUNTER — Ambulatory Visit: Payer: Medicare Other | Attending: Internal Medicine | Admitting: Rehabilitative and Restorative Service Providers"

## 2014-05-23 DIAGNOSIS — R42 Dizziness and giddiness: Secondary | ICD-10-CM

## 2014-05-23 NOTE — Therapy (Signed)
[facility] [address] [place], [place], [address] Phone: [phone]   Fax:  [phone]  Patient Details  Name: Andrea Cantrell MRN: [id number] Date of Birth: 1950-08-14 Referring Provider:  Rise Patience, MD  Encounter Date: 05/23/2014  The patient went to ED in 03/2014 for dizziness/vertigo.  She reports that that issue has cleared up completely since that time.  She arrived to PT today reporting she came b/c she wasn't sure if PT was for dizziness or shoulder issues.  When PT reviewed her referral, she did not think she needed PT for vestibular issues any more.  [place] 05/23/2014, 8:57 AM  [facility] [address] [place], [place], [address] Phone: [phone]   Fax:  [phone]

## 2014-06-11 ENCOUNTER — Other Ambulatory Visit: Payer: Self-pay | Admitting: Neurology

## 2014-06-11 NOTE — Telephone Encounter (Signed)
Patient requesting refill for Rx traMADol (ULTRAM) 50 MG tablet.  Please forward to [facility] on [address]  Please call and advise.

## 2014-06-12 MED ORDER — TRAMADOL HCL 50 MG PO TABS: 50.0000 mg | ORAL_TABLET | Freq: Four times a day (QID) | ORAL | Status: DC | PRN

## 2014-06-12 NOTE — Telephone Encounter (Signed)
Rx signed and faxed.

## 2014-07-18 HISTORY — PX: ROTATOR CUFF REPAIR: SHX139

## 2014-07-19 ENCOUNTER — Encounter: Payer: Self-pay | Admitting: Neurology

## 2014-07-19 ENCOUNTER — Ambulatory Visit (INDEPENDENT_AMBULATORY_CARE_PROVIDER_SITE_OTHER): Payer: PPO | Admitting: Neurology

## 2014-07-19 VITALS — BP 122/88 | HR 81 | Ht 62.0 in | Wt 216.0 lb

## 2014-07-19 DIAGNOSIS — M75101 Unspecified rotator cuff tear or rupture of right shoulder, not specified as traumatic: Secondary | ICD-10-CM

## 2014-07-19 DIAGNOSIS — M5416 Radiculopathy, lumbar region: Secondary | ICD-10-CM | POA: Diagnosis not present

## 2014-07-19 DIAGNOSIS — G35 Multiple sclerosis: Secondary | ICD-10-CM | POA: Diagnosis not present

## 2014-07-19 MED ORDER — TRAMADOL HCL 50 MG PO TABS: 100.0000 mg | ORAL_TABLET | Freq: Three times a day (TID) | ORAL | Status: DC

## 2014-07-19 NOTE — Progress Notes (Signed)
Chief Complaint  Patient presents with  . Multiple Sclerosis    Says pain in her right foot has improved with shoe inserts and steroid injections.  She is now conerned about some spasms she has been feeling in her stomach.  She needs clearance for right shoulder/bicep surgery scheduled on 07/23/14 with Dr. Veverly Fells at [facility]     [facility]  PATIENT: Andrea Cantrell DOB: 1950/09/08  [name], is a 64 year old female returns for followup of  relapsing remitting multiple sclerosis   She has a history of multiple sclerosis since 1990s, also with past medical history of hypertension, hyperlipidemia, fibromyalgia, and discoid lupus in the 1990s. She rarely has a flareup from her lupus   She has episodes of gait difficulty, in 2006, took about 6 months to recover.  From then on, she kept on having episodes of worsening gait difficulty, blurry vision. Diagnosis was made in 2013, based upon abnormal MRI.  MRI scan of the brain shows multiple periventricular lesions solitary enhancing left frontal subcortical lesion with multiple nonenhancing periventricular and subcortical hypointense lesions consistent with myelinating disease.   MRI of the cervical spine with mild degenerative changes but no enhancing lesions are noted.   She was started on tecfidera in October 2013 and has tolerated the medication extremely well. Initially she thought she developed a rash but apparently that was due to a GI virus and that has disappeared.   UPDATE June 10th 2015:  She still complains of low back, lower extremity pain, muscle spasm at her toes, she went to hospital in Jul 05 2013, complains of chest tightness, chest pain, near syncope episode, EKG was normal, chest x-ray was normal, laboratory showed normal CBC, CMP with exception of mild elevated alkaline phosphate 140, she was diagnosed with dehydration, which has improved with IV fluid, and pain medications, normal nuclear  stress test in October 2015  She now complains of left neck pain, bilateral lower extremity pain  UPDATE Feb 8th 2016: She complains of right knee pain, right foot numbness since  Mar 18 2014, going up her right leg, when she put pressure on her right foot, she noticed right toe pain, going up her right leg,  She also complains of low back pain, going down her left hip and left leg, she also has right arm intermittent sharp pain.  She went to ED in Dec 27th 2015 for worsening left side low back pain, shooting pain to left leg.  UPDATE June 2nd 2016: We have reviewed MRI lumbar in February 2016, severe facet arthrosis at L4-5 with new grade 1 anterolisthesis and severe spinal stenosis. Moderate multifactorial spinal stenosis and moderate left neural foraminal stenosis at L3-4.  She has developed severe right shoulder pain in March 2016, hematoma at right forearm, MRI right shoulder March 24th 2016: Complete tear of the supraspinatus tendon with 3.4 cm of retraction. Complete tear of the intraarticular portion of the long head of the biceps tendon. Mild osteoarthritis of the glenohumeral joint. Moderate degenerative changes of the acromioclavicular joint.  She will have right rotator cuff surgery in July 23 2014 by Dr. Veverly Fells, has stopped aspirin in June first 2016, ibuprofen 5 days prior to that.  She also complains of chronic low back pain, worsening with prolonged walking, or bearing weight, is taking tramadol 50 mg 2 tablets twice a day, which has been effective.   REVIEW OF SYSTEMS: Full 14 system review of systems performed and notable only for those listed,  all others are neg: Go fatigue, ringing ears, light sensitivity, double vision, blurred vision, heat intolerance, insomnia, frequent awakening, daytime sleepiness, snoring, sleep talking, joint pain, low back pain, achy muscles, walking difficulty, neck pain, neck stiffness, headaches, numbness   ALLERGIES: No Known Allergies  HOME  MEDICATIONS: Outpatient Prescriptions Prior to Visit  Medication Sig Dispense Refill  . albuterol (PROVENTIL HFA;VENTOLIN HFA) 108 (90 BASE) MCG/ACT inhaler Inhale 2 puffs into the lungs every 6 (six) hours as needed for wheezing or shortness of breath (wheezing and shortness of breath). For shortness of breath.    Marland Kitchen amLODipine (NORVASC) 5 MG tablet Take 5 mg by mouth every morning.     Marland Kitchen aspirin EC 81 MG tablet Take 162 mg by mouth every evening.     Marland Kitchen atorvastatin (LIPITOR) 10 MG tablet Take 10 mg by mouth every morning.     . baclofen (LIORESAL) 10 MG tablet Take 10 mg by mouth daily.     . Dimethyl Fumarate (TECFIDERA) 240 MG CPDR Take 240 mg by mouth every 12 (twelve) hours.     . DULoxetine (CYMBALTA) 60 MG capsule Take 1 capsule (60 mg total) by mouth daily. 30 capsule 11  . gabapentin (NEURONTIN) 300 MG capsule Take 2 capsules (600 mg total) by mouth 3 (three) times daily. 180 capsule 11  . ibuprofen (ADVIL,MOTRIN) 200 MG tablet Take 800 mg by mouth every 6 (six) hours as needed for moderate pain (right arm pain).     . meclizine (ANTIVERT) 25 MG tablet Take 1 tablet (25 mg total) by mouth 3 (three) times daily as needed for dizziness. 45 tablet 0  . traMADol (ULTRAM) 50 MG tablet Take 1 tablet (50 mg total) by mouth every 6 (six) hours as needed. 60 tablet 5  . VITAMIN D, CHOLECALCIFEROL, PO Take 3,000 Units by mouth daily.    . Diclofenac 18 MG CAPS Take 1 capsule by mouth at bedtime.    . Topiramate ER 50 MG CP24 Take 1 capsule by mouth at bedtime.     No facility-administered medications prior to visit.    PAST MEDICAL HISTORY: Past Medical History  Diagnosis Date  . Essential hypertension   . Fibromyalgia   . Discoid lupus   . MS (multiple sclerosis)   . Hyperlipidemia with target LDL less than 130   . Obesity (BMI 35.0-39.9 without comorbidity)   . Heavy smoker (more than 20 cigarettes per day)     Was able to stop for 11 years but restarted about 10 years ago    PAST  SURGICAL HISTORY: Past Surgical History  Procedure Laterality Date  . Abdominal hysterectomy  1995  . Cholecystectomy  1994  . Cesarean section  [place]  . Breast surgery Left     tissue removal    FAMILY HISTORY: Family History  Problem Relation Age of Onset  . Cancer Mother   . Cancer Father   . Diabetes Brother   . Diabetes Maternal Grandmother     SOCIAL HISTORY: History   Social History  . Marital Status: Legally Separated    Spouse Name: N/A  . Number of Children: 2  . Years of Education: 12+   Occupational History  . Not on file.   Social History Main Topics  . Smoking status: Current Every Day Smoker -- 1.00 packs/day    Types: Cigarettes  . Smokeless tobacco: Never Used  . Alcohol Use: No  . Drug Use: No  . Sexual Activity: Not on file  Other Topics Concern  . Not on file   Social History Narrative   Patient lives at home with granddaughter her her 2 children.    Patient is separated. Patient has 2 children.    Patient has some college. Patient is on Disability.    Smokes roughly one pack a day. No alcohol.     PHYSICAL EXAM  Filed Vitals:   07/19/14 1448  BP: 122/88  Pulse: 81  Height: 5\' 2"  (1.575 m)  Weight: 216 lb (97.977 kg)   Body mass index is 39.5 kg/(m^2).  PHYSICAL EXAMNIATION:  Gen: NAD, conversant, well nourised, obese, well groomed                     Cardiovascular: Regular rate rhythm, no peripheral edema, warm, nontender. Eyes: Conjunctivae clear without exudates or hemorrhage Neck: Supple, no carotid bruise. Pulmonary: Clear to auscultation bilaterally   NEUROLOGICAL EXAM:  MENTAL STATUS: Speech:    Speech is normal; fluent and spontaneous with normal comprehension.  Cognition:    The patient is oriented to person, place, and time;     recent and remote memory intact;     language fluent;     normal attention, concentration,     fund of knowledge.  CRANIAL NERVES: CN II: Visual fields are full to  confrontation. Fundoscopic exam is normal with sharp discs and no vascular changes. Venous pulsations are present bilaterally. Pupils are 4 mm and briskly reactive to light. Visual acuity is 20/20 bilaterally. CN III, IV, VI: extraocular movement are normal. No ptosis. CN V: Facial sensation is intact to pinprick in all 3 divisions bilaterally. Corneal responses are intact.  CN VII: Face is symmetric with normal eye closure and smile. CN VIII: Hearing is normal to rubbing fingers CN IX, X: Palate elevates symmetrically. Phonation is normal. CN XI: Head turning and shoulder shrug are intact CN XII: Tongue is midline with normal movements and no atrophy.  MOTOR:  Muscle bulk and tone are normal. Muscle strength is normal, limited range of motion of right shoulder  REFLEXES: Reflexes are 2+ and symmetric at the biceps, triceps, knees, and ankles. Plantar responses are flexor.  SENSORY: Light touch, pinprick, position sense, and vibration sense are intact in fingers and toes.  COORDINATION: Rapid alternating movements and fine finger movements are intact. There is no dysmetria on finger-to-nose and heel-knee-shin. There are no abnormal or extraneous movements.   GAIT/STANCE: Cautious, steady  DIAGNOSTIC DATA (LABS, IMAGING, TESTING) - I reviewed patient records, labs, notes, testing and imaging myself where available.  Lab Results  Component Value Date   WBC 6.4 04/13/2014   HGB 12.5 04/13/2014   HCT 38.7 04/13/2014   MCV 80.8 04/13/2014   PLT 399 04/13/2014      Component Value Date/Time   NA 137 04/13/2014 0804   NA 140 01/25/2013 1112   K 3.9 04/13/2014 0804   CL 105 04/13/2014 0804   CO2 26 04/13/2014 0804   GLUCOSE 93 04/13/2014 0804   GLUCOSE 94 01/25/2013 1112   BUN <5* 04/13/2014 0804   BUN 14 01/25/2013 1112   CREATININE 0.70 04/13/2014 0804   CALCIUM 8.3* 04/13/2014 0804   PROT 6.3 04/13/2014 0804   PROT 7.2 01/25/2013 1112   ALBUMIN 3.2* 04/13/2014 0804   AST  17 04/13/2014 0804   ALT 15 04/13/2014 0804   ALKPHOS 98 04/13/2014 0804   BILITOT 0.4 04/13/2014 0804   GFRNONAA 90* 04/13/2014 0804   GFRAA >90 04/13/2014 0804  ASSESSMENT AND PLAN  64 y.o. year old female with relapsing remitting multiple sclerosis, is taking Tecfidera since Oct 2013, now complains worsening low back pain, radiating pain to her right leg, right leg numbness, MRI lumbar demonstrate multilevel lumbar degenerative disc disease, most severe at L4-5, with severe spinal stenosis, she is planning on going to right rotator cuff operation in July 23 2014,  1, she is fairly stable from multiple sclerosis standpoint, continue Tecfidera 2. Lumbar stenosis, most severe at L4-5, continue moderate exercise, tramadol as needed for pain. 3. She has stopped ASA since June 1st 2016. 4. RTC in 6 months     Marcial Pacas, Ph.D. M.D.  [facility] [address], [place], [place] [address] [phone]

## 2014-09-01 ENCOUNTER — Other Ambulatory Visit: Payer: Self-pay | Admitting: Neurology

## 2014-09-02 NOTE — Telephone Encounter (Signed)
Prescribed at [place] in June

## 2014-10-03 ENCOUNTER — Other Ambulatory Visit: Payer: Self-pay | Admitting: Neurology

## 2014-10-12 ENCOUNTER — Other Ambulatory Visit: Payer: Self-pay

## 2014-10-12 MED ORDER — BACLOFEN 10 MG PO TABS: 10.0000 mg | ORAL_TABLET | Freq: Three times a day (TID) | ORAL | Status: DC

## 2015-01-21 ENCOUNTER — Encounter: Payer: Self-pay | Admitting: Neurology

## 2015-01-21 ENCOUNTER — Ambulatory Visit (INDEPENDENT_AMBULATORY_CARE_PROVIDER_SITE_OTHER): Payer: PPO | Admitting: Neurology

## 2015-01-21 VITALS — BP 134/80 | HR 73 | Ht 62.0 in | Wt 208.0 lb

## 2015-01-21 DIAGNOSIS — G35 Multiple sclerosis: Secondary | ICD-10-CM | POA: Diagnosis not present

## 2015-01-21 DIAGNOSIS — M5416 Radiculopathy, lumbar region: Secondary | ICD-10-CM

## 2015-01-21 MED ORDER — BACLOFEN 10 MG PO TABS: 10.0000 mg | ORAL_TABLET | Freq: Three times a day (TID) | ORAL | Status: DC

## 2015-01-21 MED ORDER — GABAPENTIN 300 MG PO CAPS: 600.0000 mg | ORAL_CAPSULE | Freq: Three times a day (TID) | ORAL | Status: DC

## 2015-01-21 MED ORDER — TRAMADOL HCL 50 MG PO TABS: 50.0000 mg | ORAL_TABLET | Freq: Three times a day (TID) | ORAL | Status: DC | PRN

## 2015-01-21 NOTE — Progress Notes (Signed)
Chief Complaint  Patient presents with  . Multiple Sclerosis    She is still taking Tecfidera, as prescribed.  She has noticed a decrease in her energy level.  . Back Pain    She has low back pain and lower extremity weakness that is worse in the morning.  Says she has been diagnosed with spinal stenosis.  . Discoid Lupus    She would like a referral to rheumatology.  Says she has only had her condition managed by her PCP for the last few years.     [facility]  PATIENT: Andrea Cantrell DOB: 1950-08-13  Andrea Cantrell, is a 64 year old female returns for followup of  relapsing remitting multiple sclerosis   She has a history of multiple sclerosis since 1990s, also with past medical history of hypertension, hyperlipidemia, fibromyalgia, and discoid lupus in the 1990s. She rarely has a flareup from her lupus   She has episodes of gait difficulty, in 2006, took about 6 months to recover.  From then on, she kept on having episodes of worsening gait difficulty, blurry vision. Diagnosis was made in 2013, based upon abnormal MRI.  MRI scan of the brain shows multiple periventricular lesions solitary enhancing left frontal subcortical lesion with multiple nonenhancing periventricular and subcortical hypointense lesions consistent with myelinating disease.   MRI of the cervical spine with mild degenerative changes but no enhancing lesions are noted.   She was started on tecfidera in October 2013 and has tolerated the medication extremely well. Initially she thought she developed a rash but apparently that was due to a GI virus and that has disappeared.   UPDATE June 10th 2015:  She still complains of low back, lower extremity pain, muscle spasm at her toes, she went to hospital in Jul 05 2013, complains of chest tightness, chest pain, near syncope episode, EKG was normal, chest x-ray was normal, laboratory showed normal CBC, CMP with exception of mild elevated alkaline phosphate  140, she was diagnosed with dehydration, which has improved with IV fluid, and pain medications, normal nuclear stress test in October 2015  She now complains of left neck pain, bilateral lower extremity pain  UPDATE Feb 8th 2016: She complains of right knee pain, right foot numbness since  Mar 18 2014, going up her right leg, when she put pressure on her right foot, she noticed right toe pain, going up her right leg,  She also complains of low back pain, going down her left hip and left leg, she also has right arm intermittent sharp pain.  She went to ED in Dec 27th 2015 for worsening left side low back pain, shooting pain to left leg.  UPDATE June 2nd 2016: We have reviewed MRI lumbar in February 2016, severe facet arthrosis at L4-5 with new grade 1 anterolisthesis and severe spinal stenosis. Moderate multifactorial spinal stenosis and moderate left neural foraminal stenosis at L3-4.  She has developed severe right shoulder pain in March 2016, hematoma at right forearm, MRI right shoulder March 24th 2016: Complete tear of the supraspinatus tendon with 3.4 cm of retraction. Complete tear of the intraarticular portion of the long head of the biceps tendon. Mild osteoarthritis of the glenohumeral joint. Moderate degenerative changes of the acromioclavicular joint.  She will have right rotator cuff surgery in July 23 2014 by Dr. Veverly Fells, has stopped aspirin in June first 2016, ibuprofen 5 days prior to that.  She also complains of chronic low back pain, worsening with prolonged walking, or bearing  weight, is taking tramadol 50 mg 2 tablets twice a day, which has been effective.  UPDATE Dec 5th 2016: She did have right rotator cuff surgery in June of 2016 by orthopedic surgeon Dr. Veverly Fells, recovered very well, she continue have low back pain, radiating pain to bilateral lower extremity, especially early morning, she denies bowel and bladder incontinence, no significant gait difficulty, but complains of  increased low back pain, radiating pain to bilateral lower extremity and lack of stamina We have personally reviewed MRI of lumbar spine in February 2016, multilevel degenerative changes, most severe at L4-5, with moderate to severe spinal canal stenosis   REVIEW OF SYSTEMS: Full 14 system review of systems performed and notable only for those listed, all others are neg: Hearing loss, double vision, blurry vision, heat intolerance, excessive eating, nausea, insomnia, memory loss  ALLERGIES: No Known Allergies  HOME MEDICATIONS: Outpatient Prescriptions Prior to Visit  Medication Sig Dispense Refill  . albuterol (PROVENTIL HFA;VENTOLIN HFA) 108 (90 BASE) MCG/ACT inhaler Inhale 2 puffs into the lungs every 6 (six) hours as needed for wheezing or shortness of breath (wheezing and shortness of breath). For shortness of breath.    Marland Kitchen amLODipine (NORVASC) 5 MG tablet Take 5 mg by mouth every morning.     Marland Kitchen aspirin EC 81 MG tablet Take 162 mg by mouth every evening.     Marland Kitchen atorvastatin (LIPITOR) 10 MG tablet Take 10 mg by mouth every morning.     . baclofen (LIORESAL) 10 MG tablet Take 1 tablet (10 mg total) by mouth 3 (three) times daily. 90 tablet 6  . DULoxetine (CYMBALTA) 60 MG capsule Take 1 capsule (60 mg total) by mouth daily. 30 capsule 11  . gabapentin (NEURONTIN) 300 MG capsule Take 2 capsules (600 mg total) by mouth 3 (three) times daily. 180 capsule 11  . ibuprofen (ADVIL,MOTRIN) 200 MG tablet Take 800 mg by mouth every 6 (six) hours as needed for moderate pain (right arm pain).     . meclizine (ANTIVERT) 25 MG tablet Take 1 tablet (25 mg total) by mouth 3 (three) times daily as needed for dizziness. 45 tablet 0  . TECFIDERA 240 MG CPDR TAKE ONE CAPSULE (240 MG) BY MOUTH TWO TIMES DAILY . STORE AT ROOM TEMP IN THE ORIGINAL CONTAINER. DO NOT CRUSH OR CHEW. SWALLOW WHOLE. DISC 180 capsule 1  . traMADol (ULTRAM) 50 MG tablet Take 2 tablets (100 mg total) by mouth 3 (three) times daily. 180  tablet 5  . VITAMIN D, CHOLECALCIFEROL, PO Take 3,000 Units by mouth daily.     No facility-administered medications prior to visit.    PAST MEDICAL HISTORY: Past Medical History  Diagnosis Date  . Essential hypertension   . Fibromyalgia   . Discoid lupus   . Andrea (multiple sclerosis) ([place])   . Hyperlipidemia with target LDL less than 130   . Obesity (BMI 35.0-39.9 without comorbidity) ([place])   . Heavy smoker (more than 20 cigarettes per day)     Was able to stop for 11 years but restarted about 10 years ago  . Spinal stenosis     PAST SURGICAL HISTORY: Past Surgical History  Procedure Laterality Date  . Abdominal hysterectomy  1995  . Cholecystectomy  1994  . Cesarean section  [place]  . Breast surgery Left     tissue removal  . Rotator cuff repair Left June 2016    FAMILY HISTORY: Family History  Problem Relation Age of Onset  . Cancer  Mother   . Cancer Father   . Diabetes Brother   . Diabetes Maternal Grandmother     SOCIAL HISTORY: Social History   Social History  . Marital Status: Legally Separated    Spouse Name: N/A  . Number of Children: 2  . Years of Education: 12+   Occupational History  . Not on file.   Social History Main Topics  . Smoking status: Current Every Day Smoker -- 1.00 packs/day    Types: Cigarettes  . Smokeless tobacco: Never Used  . Alcohol Use: No  . Drug Use: No  . Sexual Activity: Not on file   Other Topics Concern  . Not on file   Social History Narrative   Patient lives at home with granddaughter her her 2 children.    Patient is separated. Patient has 2 children.    Patient has some college. Patient is on Disability.    Smokes roughly one pack a day. No alcohol.     PHYSICAL EXAM  Filed Vitals:   01/21/15 1435  BP: 134/80  Pulse: 73  Height: 5\' 2"  (1.575 m)  Weight: 208 lb (94.348 kg)   Body mass index is 38.03 kg/(m^2).  PHYSICAL EXAMNIATION:  Gen: NAD, conversant, well nourised, obese, well groomed                      Cardiovascular: Regular rate rhythm, no peripheral edema, warm, nontender. Eyes: Conjunctivae clear without exudates or hemorrhage Neck: Supple, no carotid bruise. Pulmonary: Clear to auscultation bilaterally   NEUROLOGICAL EXAM:  MENTAL STATUS: Speech:    Speech is normal; fluent and spontaneous with normal comprehension.  Cognition:    The patient is oriented to person, place, and time;     recent and remote memory intact;     language fluent;     normal attention, concentration,     fund of knowledge.  CRANIAL NERVES: CN II: Visual fields are full to confrontation. Fundoscopic exam is normal with sharp discs and no vascular changes. Venous pulsations are present bilaterally. Pupils are 4 mm and briskly reactive to light. Visual acuity is 20/20 bilaterally. CN III, IV, VI: extraocular movement are normal. No ptosis. CN V: Facial sensation is intact to pinprick in all 3 divisions bilaterally. Corneal responses are intact.  CN VII: Face is symmetric with normal eye closure and smile. CN VIII: Hearing is normal to rubbing fingers CN IX, X: Palate elevates symmetrically. Phonation is normal. CN XI: Head turning and shoulder shrug are intact CN XII: Tongue is midline with normal movements and no atrophy.  MOTOR:  Muscle bulk and tone are normal. Muscle strength is normal, limited range of motion of right shoulder  REFLEXES: Reflexes are 2+ and symmetric at the biceps, triceps, knees, and ankles. Plantar responses are flexor.  SENSORY: Light touch, pinprick, position sense, and vibration sense are intact in fingers and toes.  COORDINATION: Rapid alternating movements and fine finger movements are intact. There is no dysmetria on finger-to-nose and heel-knee-shin. There are no abnormal or extraneous movements.   GAIT/STANCE: Cautious, steady  DIAGNOSTIC DATA (LABS, IMAGING, TESTING) - I reviewed patient records, labs, notes, testing and imaging myself where  available.  ASSESSMENT AND PLAN  64 y.o. year old female with relapsing remitting multiple sclerosis, is taking Tecfidera since Oct 2013, now complains worsening low back pain, radiating pain to her right leg, right leg numbness, MRI lumbar demonstrate multilevel lumbar degenerative disc disease, most severe at L4-5, with severe spinal  stenosis, she is planning on to have right rotator cuff operation in July 23 2014  Relapsing remitting multiple sclerosis  Continue Tecfidera twice a day Lumbar stenosis  She continued to have low back pain  Continue gabapentin 300 mg 2 tablets 3 times a day, tramadol 50 mg 3 times a day,   Return to clinic with nurse practitioner in 6 months, if she complains of worsening low back pain, will do EMG nerve conduction study, potential neurosurgical refer  Repeat MRI of the brain with and without contrast at next follow-up visit   Marcial Pacas, Ph.D. M.D.  [facility] [address], [place],  [address] [phone]

## 2015-01-22 ENCOUNTER — Telehealth: Payer: Self-pay | Admitting: Neurology

## 2015-01-22 LAB — COMPREHENSIVE METABOLIC PANEL
ALBUMIN: 4 g/dL (ref 3.6–4.8)
ALK PHOS: 132 IU/L — AB (ref 39–117)
ALT: 7 IU/L (ref 0–32)
AST: 11 IU/L (ref 0–40)
Albumin/Globulin Ratio: 1.4 (ref 1.1–2.5)
BUN / CREAT RATIO: 11 (ref 11–26)
BUN: 7 mg/dL — ABNORMAL LOW (ref 8–27)
Bilirubin Total: 0.3 mg/dL (ref 0.0–1.2)
CHLORIDE: 101 mmol/L (ref 97–106)
CO2: 26 mmol/L (ref 18–29)
Calcium: 9 mg/dL (ref 8.7–10.3)
Creatinine, Ser: 0.61 mg/dL (ref 0.57–1.00)
GFR calc Af Amer: 110 mL/min/{1.73_m2} (ref >59)
GFR calc non Af Amer: 95 mL/min/{1.73_m2} (ref >59)
GLUCOSE: 101 mg/dL — AB (ref 65–99)
Globulin, Total: 2.8 g/dL (ref 1.5–4.5)
Potassium: 4.3 mmol/L (ref 3.5–5.2)
SODIUM: 142 mmol/L (ref 136–144)
Total Protein: 6.8 g/dL (ref 6.0–8.5)

## 2015-01-22 LAB — CBC
HEMATOCRIT: 40.2 % (ref 34.0–46.6)
Hemoglobin: 12.7 g/dL (ref 11.1–15.9)
MCH: 24.9 pg — AB (ref 26.6–33.0)
MCHC: 31.6 g/dL (ref 31.5–35.7)
MCV: 79 fL (ref 79–97)
Platelets: 479 10*3/uL — ABNORMAL HIGH (ref 150–379)
RBC: 5.1 x10E6/uL (ref 3.77–5.28)
RDW: 18.5 % — ABNORMAL HIGH (ref 12.3–15.4)
WBC: 5.3 10*3/uL (ref 3.4–10.8)

## 2015-01-22 NOTE — Telephone Encounter (Signed)
Please call patient, laboratory showed no significant abnormality. She should continue Tecfidera  CMP showed mild elevated alkaline phosphate, which is at her baseline, mild elevated glucose 101, likely related to timing of the sample, CBC showed elevated MCH, again is at baseline

## 2015-01-22 NOTE — Telephone Encounter (Signed)
Notified patient of results on her personal voicemail.  Left our number to call with any questions.

## 2015-04-08 ENCOUNTER — Encounter: Payer: Self-pay | Admitting: *Deleted

## 2015-05-21 ENCOUNTER — Ambulatory Visit (INDEPENDENT_AMBULATORY_CARE_PROVIDER_SITE_OTHER): Payer: PPO | Admitting: Cardiology

## 2015-05-21 ENCOUNTER — Encounter: Payer: Self-pay | Admitting: Cardiology

## 2015-05-21 VITALS — BP 118/68 | HR 60 | Ht 62.0 in | Wt 207.2 lb

## 2015-05-21 DIAGNOSIS — R0609 Other forms of dyspnea: Secondary | ICD-10-CM | POA: Diagnosis not present

## 2015-05-21 DIAGNOSIS — E669 Obesity, unspecified: Secondary | ICD-10-CM | POA: Diagnosis not present

## 2015-05-21 DIAGNOSIS — E785 Hyperlipidemia, unspecified: Secondary | ICD-10-CM | POA: Diagnosis not present

## 2015-05-21 DIAGNOSIS — I10 Essential (primary) hypertension: Secondary | ICD-10-CM | POA: Diagnosis not present

## 2015-05-21 NOTE — Patient Instructions (Signed)
Your physician encouraged you to lose weight for better health.  INCREASE EXERCISE , MAY TRY DASH DIET OR MEDITERRANEAN DIET  NO CHANGE IN CURRENT MEDICATIONS  Your physician wants you to follow-up in 12 MONTHS WITH DR HARDING. You will receive a reminder letter in the mail two months in advance. If you don't receive a letter, please call our office to schedule the follow-up appointment.  If you need a refill on your cardiac medications before your next appointment, please call your pharmacy.

## 2015-05-21 NOTE — Progress Notes (Signed)
PCP: Pcp Not In System  Clinic Note: Chief Complaint  Patient presents with  . Annual Exam    gets left side numbness when laying on right side    HPI: Andrea Cantrell is a 65 y.o. female with a PMH below who presents today for 18 month follow-up. She has a history of discoid lupus, fibromyalgia, hypertension and hyperlipidemia as well as multiple sclerosis. She has chronic pain. I saw her back in October 2015 consultation for chest pain with exertional dyspnea. She never followed back up again after stress test. She basically describes spiking chest pain sensations on the left upper chest under her breast.   Recent Hospitalizations: She had a fall in February 2016 and was diagnosed with possible spinal stenosis.  Studies Reviewed:   Myoview 11/28/2013: NORMAL STRESS TEST. LOW RISK. No ischemia or infarction. Normal LV function. EF 71%.  Interval History: Andrea Cantrell presents today I think because she needed Refill prescription. She has not had anymore the chest discomfort episodes. She unfortunately has gained weight and is therefore noticing some exertional dyspnea, but nothing significant that is associated with any chest tightness or pressure. No PND, orthopnea or edema. No resting dyspnea.   No palpitations, lightheadedness, dizziness, weakness or syncope/near syncope. No TIA/amaurosis fugax symptoms.  No claudication.  1 strange symptom she notes is that she has numbness on her left side when she lies on her right. No discomfort no weakness.  ROS: A comprehensive was performed. Review of Systems  HENT: Negative for congestion and nosebleeds.   Respiratory: Positive for shortness of breath (With exertion).   Cardiovascular: Negative for claudication and leg swelling.  Gastrointestinal: Negative for blood in stool and melena.  Genitourinary: Negative for hematuria.  Musculoskeletal: Positive for back pain and joint pain. Negative for myalgias and falls.  Neurological: Positive for  tingling (With numbness on left side when lying on right).  Endo/Heme/Allergies: Does not bruise/bleed easily.  Psychiatric/Behavioral: Negative for depression and memory loss. The patient is nervous/anxious. The patient does not have insomnia.   All other systems reviewed and are negative.   Past Medical History  Diagnosis Date  . Essential hypertension   . Fibromyalgia   . Discoid lupus   . MS (multiple sclerosis) ([place])   . Hyperlipidemia with target LDL less than 130   . Obesity (BMI 35.0-39.9 without comorbidity) ([place])   . Heavy smoker (more than 20 cigarettes per day)     Was able to stop for 11 years but restarted about 10 years ago  . Spinal stenosis     Past Surgical History  Procedure Laterality Date  . Abdominal hysterectomy  1995  . Cholecystectomy  1994  . Cesarean section  [place]  . Breast surgery Left     tissue removal  . Rotator cuff repair Left June 2016  . Nm myoview ltd  October 2015    EF 71%. No ischemia or infarction. Low risk/normal   Prior to Admission medications   Medication Sig Start Date End Date Taking? Authorizing Provider  albuterol (PROVENTIL HFA;VENTOLIN HFA) 108 (90 BASE) MCG/ACT inhaler Inhale 2 puffs into the lungs every 6 (six) hours as needed for wheezing or shortness of breath (wheezing and shortness of breath). For shortness of breath.   Yes Historical Provider, MD  amLODipine (NORVASC) 5 MG tablet Take 5 mg by mouth every morning.    Yes Historical Provider, MD  aspirin EC 81 MG tablet Take 162 mg by mouth every evening.  Yes Historical Provider, MD  atorvastatin (LIPITOR) 10 MG tablet Take 10 mg by mouth every morning.    Yes Historical Provider, MD  baclofen (LIORESAL) 10 MG tablet Take 1 tablet (10 mg total) by mouth 3 (three) times daily. 01/21/15  Yes Marcial Pacas, MD  DULoxetine (CYMBALTA) 60 MG capsule Take 1 capsule (60 mg total) by mouth daily. 03/26/14  Yes Marcial Pacas, MD  gabapentin (NEURONTIN) 300 MG capsule Take 2 capsules  (600 mg total) by mouth 3 (three) times daily. 01/21/15  Yes Marcial Pacas, MD  ibuprofen (ADVIL,MOTRIN) 200 MG tablet Take 800 mg by mouth every 6 (six) hours as needed for moderate pain (right arm pain).    Yes Historical Provider, MD  meclizine (ANTIVERT) 25 MG tablet Take 1 tablet (25 mg total) by mouth 3 (three) times daily as needed for dizziness. 04/14/14  Yes Barton Dubois, MD  TECFIDERA 240 MG CPDR TAKE ONE CAPSULE (240 MG) BY MOUTH TWO TIMES DAILY . STORE AT ROOM TEMP IN THE ORIGINAL CONTAINER. DO NOT CRUSH OR CHEW. SWALLOW WHOLE. DISC 10/03/14  Yes Marcial Pacas, MD  traMADol (ULTRAM) 50 MG tablet Take 1 tablet (50 mg total) by mouth every 8 (eight) hours as needed. 01/21/15  Yes Marcial Pacas, MD  VITAMIN D, CHOLECALCIFEROL, PO Take 3,000 Units by mouth daily.   Yes Historical Provider, MD   No Known Allergies   Social History   Social History  . Marital Status: Legally Separated    Spouse Name: N/A  . Number of Children: 2  . Years of Education: 12+   Social History Main Topics  . Smoking status: Current Every Day Smoker -- 1.00 packs/day    Types: Cigarettes  . Smokeless tobacco: Never Used  . Alcohol Use: No  . Drug Use: No  . Sexual Activity: Not Asked   Other Topics Concern  . None   Social History Narrative   Patient lives at home with granddaughter her her 2 children.    Patient is separated. Patient has 2 children.    Patient has some college. Patient is on Disability.    Smokes roughly one pack a day. No alcohol.   Family History  Problem Relation Age of Onset  . Cancer Mother   . Cancer Father   . Diabetes Brother   . Diabetes Maternal Grandmother      Wt Readings from Last 3 Encounters:  05/21/15 207 lb 3.2 oz (93.985 kg)  01/21/15 208 lb (94.348 kg)  07/19/14 216 lb (97.977 kg)    PHYSICAL EXAM BP 118/68 mmHg  Pulse 60  Ht 5\' 2"  (1.575 m)  Wt 207 lb 3.2 oz (93.985 kg)  BMI 37.89 kg/m2 General appearance: alert, cooperative, appears stated age,  moderately obese and Pleasant mood and affect. Neck: no adenopathy, no carotid bruit, no JVD, supple, symmetrical, trachea midline and thyroid not enlarged, symmetric, no tenderness/mass/nodules Lungs: clear to auscultation bilaterally, normal percussion bilaterally and Nonlabored, good air movement Heart: regular rate and rhythm, S1, S2 normal, no murmur, click, rub or gallop and normal apical impulse Abdomen: soft, non-tender; bowel sounds normal; no masses, no organomegaly and Obese Extremities: extremities normal, atraumatic, no cyanosis or edema and no ulcers, gangrene or trophic changes Pulses: 2+ and symmetric Neurologic: Grossly normal    Adult ECG Report  Rate: 60 ;  Rhythm: normal sinus rhythm and Normal axis, intervals and durations.;   Narrative Interpretation: Normal EKG   Other studies Reviewed: Additional studies/ records that were reviewed today include:  Recent Labs:   No results found for: CHOL, HDL, LDLCALC, LDLDIRECT, TRIG, CHOLHDL   ASSESSMENT / PLAN: Problem List Items Addressed This Visit    Obesity (BMI 35.0-39.9 without comorbidity) ([place]) (Chronic)    Thankfully, no diabetes and well-controlled hypertension.  The patient understands the need to lose weight with diet and exercise. We have discussed specific strategies for this. - We discussed different dietary modification options, and the importance of trying to figure out how to do exercise.      RESOLVED: Obesity (BMI 30-39.9) (Chronic)   Relevant Orders   EKG 12-Lead (Completed)   Hyperlipidemia with target LDL less than 130 (Chronic)    On statin, no longer on Zetia and fenofibrate. Monitored by PCP. I think having multiple medication would exacerbate her multiple different pain issues.      Relevant Orders   EKG 12-Lead (Completed)   Essential hypertension (Chronic)    Excellent control. She is now on amlodipine as opposed to ACE inhibitor and beta blocker. In the absence of diabetes or coronary  disease, amlodipine as a reasonable choice. No edema.      DOE (dyspnea on exertion) - Primary    Probably multifactorial, but likely related to obesity and deconditioning. Similar symptoms noted when she had was evaluated with a normal Myoview.  Recommended continued attempts at exercise and diet. Hopefully with more exercise and weight loss her dyspnea will improve.      Relevant Orders   EKG 12-Lead (Completed)      Current medicines are reviewed at length with the patient today. (+/- concerns) None Patient Instructions:  Your physician encouraged you to lose weight for better health.  INCREASE EXERCISE , MAY TRY DASH DIET OR MEDITERRANEAN DIET  NO CHANGE IN CURRENT MEDICATIONS  Your physician wants you to follow-up in 12 MONTHS WITH DR Tarique Loveall.  Studies Ordered:   Orders Placed This Encounter  Procedures  . EKG 12-Lead      Leonie Man, M.D., M.S. Interventional Cardiologist   Pager # [phone] Phone # [phone] [address]. [place],  [address]

## 2015-05-24 ENCOUNTER — Encounter: Payer: Self-pay | Admitting: Cardiology

## 2015-05-24 NOTE — Assessment & Plan Note (Addendum)
Thankfully, no diabetes and well-controlled hypertension.  The patient understands the need to lose weight with diet and exercise. We have discussed specific strategies for this. - We discussed different dietary modification options, and the importance of trying to figure out how to do exercise.

## 2015-05-24 NOTE — Assessment & Plan Note (Signed)
Excellent control. She is now on amlodipine as opposed to ACE inhibitor and beta blocker. In the absence of diabetes or coronary disease, amlodipine as a reasonable choice. No edema.

## 2015-05-24 NOTE — Assessment & Plan Note (Addendum)
Probably multifactorial, but likely related to obesity and deconditioning. Similar symptoms noted when she had was evaluated with a normal Myoview.  Recommended continued attempts at exercise and diet. Hopefully with more exercise and weight loss her dyspnea will improve.

## 2015-05-24 NOTE — Assessment & Plan Note (Signed)
On statin, no longer on Zetia and fenofibrate. Monitored by PCP. I think having multiple medication would exacerbate her multiple different pain issues.

## 2015-07-23 ENCOUNTER — Encounter: Payer: Self-pay | Admitting: Adult Health

## 2015-07-23 ENCOUNTER — Ambulatory Visit (INDEPENDENT_AMBULATORY_CARE_PROVIDER_SITE_OTHER): Payer: PPO | Admitting: Adult Health

## 2015-07-23 VITALS — BP 115/71 | HR 70 | Ht 62.0 in | Wt 202.8 lb

## 2015-07-23 DIAGNOSIS — M48061 Spinal stenosis, lumbar region without neurogenic claudication: Secondary | ICD-10-CM

## 2015-07-23 DIAGNOSIS — Z5181 Encounter for therapeutic drug level monitoring: Secondary | ICD-10-CM | POA: Diagnosis not present

## 2015-07-23 DIAGNOSIS — G35 Multiple sclerosis: Secondary | ICD-10-CM

## 2015-07-23 DIAGNOSIS — M4806 Spinal stenosis, lumbar region: Secondary | ICD-10-CM

## 2015-07-23 MED ORDER — TRAMADOL HCL 50 MG PO TABS: 50.0000 mg | ORAL_TABLET | Freq: Three times a day (TID) | ORAL | Status: DC | PRN

## 2015-07-23 MED ORDER — DULOXETINE HCL 60 MG PO CPEP: 60.0000 mg | ORAL_CAPSULE | Freq: Every day | ORAL | Status: DC

## 2015-07-23 MED ORDER — BACLOFEN 10 MG PO TABS: 10.0000 mg | ORAL_TABLET | Freq: Three times a day (TID) | ORAL | Status: DC

## 2015-07-23 MED ORDER — GABAPENTIN 300 MG PO CAPS: 600.0000 mg | ORAL_CAPSULE | Freq: Three times a day (TID) | ORAL | Status: DC

## 2015-07-23 NOTE — Progress Notes (Signed)
PATIENT: Andrea Cantrell DOB: [birth year]  REASON FOR VISIT: follow up- multiple sclerosis HISTORY FROM: patient  HISTORY OF PRESENT ILLNESS: Andrea Cantrell is a 65 year old female with a history of relapsed remitting multiple sclerosis. She returns today for follow-up. She continues on Tecfidera and tolerates it well. She denies any new weakness. She does state that if she lays on her right side occasionally the left side will go numb especially the arm. She states it "feels as if the muscle is numb." She denies any changes with the bowels or bladder. Denies any significant changes with her gait or balance. She states that she had one fall that occurred in her home. She states that she was backing up and her foot hit the sofa causing her to fall to the floor. Fortunately she did not suffer any injuries. She does report some blurred vision however after her recent eye exam they did recommend glasses however she is not been able to afford these yet. She does complain of some neck stiffness that radiates to the right shoulder. She states that this just recently occurred. She does have some low back pain that has been ongoing. She does not feel that her pain has gotten worse. She states that her pain is usually dependent on her activity level for the day. She takes tramadol typically 3 times a day to control her discomfort. She states that this could be psychological as she has not tried going without the medication. She returns today for an evaluation.  HISTORY (YAN): Andrea Cantrell, is a 65 year old female returns for followup of relapsing remitting multiple sclerosis  She has a history of multiple sclerosis since 1990s, also with past medical history of hypertension, hyperlipidemia, fibromyalgia, and discoid lupus in the 1990s. She rarely has a flareup from her lupus   She has episodes of gait difficulty, in 2006, took about 6 months to recover. From then on, she kept on having episodes of worsening  gait difficulty, blurry vision. Diagnosis was made in 2013, based upon abnormal MRI.  MRI scan of the brain shows multiple periventricular lesions solitary enhancing left frontal subcortical lesion with multiple nonenhancing periventricular and subcortical hypointense lesions consistent with myelinating disease.   MRI of the cervical spine with mild degenerative changes but no enhancing lesions are noted.   She was started on tecfidera in October 2013 and has tolerated the medication extremely well. Initially she thought she developed a rash but apparently that was due to a GI virus and that has disappeared.   UPDATE June 10th 2015:  She still complains of low back, lower extremity pain, muscle spasm at her toes, she went to hospital in Jul 05 2013, complains of chest tightness, chest pain, near syncope episode, EKG was normal, chest x-ray was normal, laboratory showed normal CBC, CMP with exception of mild elevated alkaline phosphate 140, she was diagnosed with dehydration, which has improved with IV fluid, and pain medications, normal nuclear stress test in October 2015  She now complains of left neck pain, bilateral lower extremity pain  UPDATE Feb 8th 2016: She complains of right knee pain, right foot numbness since Mar 18 2014, going up her right leg, when she put pressure on her right foot, she noticed right toe pain, going up her right leg, She also complains of low back pain, going down her left hip and left leg, she also has right arm intermittent sharp pain.  She went to ED in Dec 27th 2015 for worsening  left side low back pain, shooting pain to left leg.  UPDATE June 2nd 2016: We have reviewed MRI lumbar in February 2016, severe facet arthrosis at L4-5 with new grade 1 anterolisthesis and severe spinal stenosis. Moderate multifactorial spinal stenosis and moderate left neural foraminal stenosis at L3-4.  She has developed severe right shoulder pain in March 2016, hematoma at right  forearm, MRI right shoulder March 24th 2016: Complete tear of the supraspinatus tendon with 3.4 cm of retraction. Complete tear of the intraarticular portion of the long head of the biceps tendon. Mild osteoarthritis of the glenohumeral joint. Moderate degenerative changes of the acromioclavicular joint.  She will have right rotator cuff surgery in July 23 2014 by Dr. Veverly Fells, has stopped aspirin in June first 2016, ibuprofen 5 days prior to that.  She also complains of chronic low back pain, worsening with prolonged walking, or bearing weight, is taking tramadol 50 mg 2 tablets twice a day, which has been effective.  UPDATE Dec 5th 2016: She did have right rotator cuff surgery in June of 2016 by orthopedic surgeon Dr. Veverly Fells, recovered very well, she continue have low back pain, radiating pain to bilateral lower extremity, especially early morning, she denies bowel and bladder incontinence, no significant gait difficulty, but complains of increased low back pain, radiating pain to bilateral lower extremity and lack of stamina We have personally reviewed MRI of lumbar spine in February 2016, multilevel degenerative changes, most severe at L4-5, with moderate to severe spinal canal stenosis  REVIEW OF SYSTEMS: Out of a complete 14 system review of symptoms, the patient complains only of the following symptoms, and all other reviewed systems are negative.  Ringing in ears, light sensitivity, double vision, blurred vision, palpitations, frequent waking, daytime sleepiness, heat intolerance, frequency of urination, joint pain, back pain, aching muscles, neck pain, neck stiffness, memory loss, headache, numbness, bruise easily  ALLERGIES: No Known Allergies  HOME MEDICATIONS: Outpatient Prescriptions Prior to Visit  Medication Sig Dispense Refill  . albuterol (PROVENTIL HFA;VENTOLIN HFA) 108 (90 BASE) MCG/ACT inhaler Inhale 2 puffs into the lungs every 6 (six) hours as needed for wheezing or shortness  of breath (wheezing and shortness of breath). For shortness of breath.    Marland Kitchen amLODipine (NORVASC) 5 MG tablet Take 5 mg by mouth every morning.     Marland Kitchen aspirin EC 81 MG tablet Take 162 mg by mouth every evening.     Marland Kitchen atorvastatin (LIPITOR) 10 MG tablet Take 10 mg by mouth every morning.     . baclofen (LIORESAL) 10 MG tablet Take 1 tablet (10 mg total) by mouth 3 (three) times daily. 270 tablet 3  . DULoxetine (CYMBALTA) 60 MG capsule Take 1 capsule (60 mg total) by mouth daily. 30 capsule 11  . gabapentin (NEURONTIN) 300 MG capsule Take 2 capsules (600 mg total) by mouth 3 (three) times daily. 540 capsule 3  . ibuprofen (ADVIL,MOTRIN) 200 MG tablet Take 800 mg by mouth every 6 (six) hours as needed for moderate pain (right arm pain).     . meclizine (ANTIVERT) 25 MG tablet Take 1 tablet (25 mg total) by mouth 3 (three) times daily as needed for dizziness. 45 tablet 0  . TECFIDERA 240 MG CPDR TAKE ONE CAPSULE (240 MG) BY MOUTH TWO TIMES DAILY . STORE AT ROOM TEMP IN THE ORIGINAL CONTAINER. DO NOT CRUSH OR CHEW. SWALLOW WHOLE. DISC 180 capsule 1  . traMADol (ULTRAM) 50 MG tablet Take 1 tablet (50 mg total) by mouth every  8 (eight) hours as needed. 90 tablet 5  . VITAMIN D, CHOLECALCIFEROL, PO Take 3,000 Units by mouth daily.     No facility-administered medications prior to visit.    PAST MEDICAL HISTORY: Past Medical History  Diagnosis Date  . Essential hypertension   . Fibromyalgia   . Discoid lupus   . Andrea (multiple sclerosis) ([place])   . Hyperlipidemia with target LDL less than 130   . Obesity (BMI 35.0-39.9 without comorbidity) ([place])   . Heavy smoker (more than 20 cigarettes per day)     Was able to stop for 11 years but restarted about 10 years ago  . Spinal stenosis     PAST SURGICAL HISTORY: Past Surgical History  Procedure Laterality Date  . Abdominal hysterectomy  1995  . Cholecystectomy  1994  . Cesarean section  [place]  . Breast surgery Left     tissue removal  .  Rotator cuff repair Left June 2016  . Nm myoview ltd  October 2015    EF 71%. No ischemia or infarction. Low risk/normal    FAMILY HISTORY: Family History  Problem Relation Age of Onset  . Cancer Mother   . Cancer Father   . Diabetes Brother   . Diabetes Maternal Grandmother     SOCIAL HISTORY: Social History   Social History  . Marital Status: Legally Separated    Spouse Name: N/A  . Number of Children: 2  . Years of Education: 12+   Occupational History  . Not on file.   Social History Main Topics  . Smoking status: Current Every Day Smoker -- 1.00 packs/day    Types: Cigarettes  . Smokeless tobacco: Never Used  . Alcohol Use: No  . Drug Use: No  . Sexual Activity: Not on file   Other Topics Concern  . Not on file   Social History Narrative   Patient lives at home with granddaughter her her 2 children.    Patient is separated. Patient has 2 children.    Patient has some college. Patient is on Disability.    Smokes roughly one pack a day. No alcohol.      PHYSICAL EXAM  Filed Vitals:   07/23/15 1308  BP: 115/71  Pulse: 70  Height: 5\' 2"  (1.575 m)  Weight: 202 lb 12.8 oz (91.989 kg)   Body mass index is 37.08 kg/(m^2).  Generalized: Well developed, in no acute distress   Neurological examination  Mentation: Alert oriented to time, place, history taking. Follows all commands speech and language fluent Cranial nerve II-XII: Pupils were equal round reactive to light. Extraocular movements were full, visual field were full on confrontational test. Facial sensation and strength were normal. Uvula tongue midline. Head turning and shoulder shrug  were normal and symmetric. Motor: The motor testing reveals 5 over 5 strength of all 4 extremities. Good symmetric motor tone is noted throughout.  Sensory: Sensory testing is intact to soft touch on all 4 extremities. No evidence of extinction is noted.  Coordination: Cerebellar testing reveals good  finger-nose-finger and heel-to-shin bilaterally.  Gait and station: Gait is normal. Tandem gait is normal. Romberg is negative. No drift is seen.  Reflexes: Deep tendon reflexes are symmetric and normal bilaterally.   DIAGNOSTIC DATA (LABS, IMAGING, TESTING) - I reviewed patient records, labs, notes, testing and imaging myself where available.  Lab Results  Component Value Date   WBC 5.3 01/21/2015   HGB 12.5 04/13/2014   HCT 40.2 01/21/2015   MCV  79 01/21/2015   PLT 479* 01/21/2015      Component Value Date/Time   NA 142 01/21/2015 1514   NA 137 04/13/2014 0804   K 4.3 01/21/2015 1514   CL 101 01/21/2015 1514   CO2 26 01/21/2015 1514   GLUCOSE 101* 01/21/2015 1514   GLUCOSE 93 04/13/2014 0804   BUN 7* 01/21/2015 1514   BUN <5* 04/13/2014 0804   CREATININE 0.61 01/21/2015 1514   CALCIUM 9.0 01/21/2015 1514   PROT 6.8 01/21/2015 1514   PROT 6.3 04/13/2014 0804   ALBUMIN 4.0 01/21/2015 1514   ALBUMIN 3.2* 04/13/2014 0804   AST 11 01/21/2015 1514   ALT 7 01/21/2015 1514   ALKPHOS 132* 01/21/2015 1514   BILITOT 0.3 01/21/2015 1514   BILITOT 0.4 04/13/2014 0804   GFRNONAA 95 01/21/2015 1514   GFRAA 110 01/21/2015 1514       ASSESSMENT AND PLAN 65 y.o. year old female  has a past medical history of Essential hypertension; Fibromyalgia; Discoid lupus; Andrea (multiple sclerosis) ([place]); Hyperlipidemia with target LDL less than 130; Obesity (BMI 35.0-39.9 without comorbidity) ([place]); Heavy smoker (more than 20 cigarettes per day); and Spinal stenosis. here with:  1. Multiple sclerosis 2. Spinal stenosis  Overall the patient has remained stable. She will continue on Tecfidera. I will check blood work today. We will repeat an MRI of the brain today. She will continue using baclofen, Cymbalta and gabapentin. I we will refill her tramadol today. I did advise that tramadol and Cymbalta can interact causing serotonin syndrome. The patient has been on these medications for quite some  time with no adverse events. Patient was advised that tramadol should be used on an as needed basis. She verbalized understanding. If her back pain worsens or she develops any new symptoms she will let us know. She will follow-up in 6 months or sooner if needed.     Ward Givens, MSN, NP-C 07/23/2015, 1:03 PM [facility] [address] [place],  [address] [phone]

## 2015-07-23 NOTE — Patient Instructions (Signed)
Continue Tecfidera Blood work today MRI of brain If your symptoms worsen or you develop new symptoms please let us know.

## 2015-07-23 NOTE — Progress Notes (Signed)
I have reviewed and agreed above plan. 

## 2015-07-24 LAB — COMPREHENSIVE METABOLIC PANEL
ALBUMIN: 4 g/dL (ref 3.6–4.8)
ALT: 8 IU/L (ref 0–32)
AST: 16 IU/L (ref 0–40)
Albumin/Globulin Ratio: 1.4 (ref 1.2–2.2)
Alkaline Phosphatase: 116 IU/L (ref 39–117)
BUN/Creatinine Ratio: 11 — ABNORMAL LOW (ref 12–28)
BUN: 7 mg/dL — AB (ref 8–27)
Bilirubin Total: 0.4 mg/dL (ref 0.0–1.2)
CALCIUM: 9.3 mg/dL (ref 8.7–10.3)
CO2: 24 mmol/L (ref 18–29)
CREATININE: 0.64 mg/dL (ref 0.57–1.00)
Chloride: 103 mmol/L (ref 96–106)
GFR calc Af Amer: 108 mL/min/{1.73_m2} (ref >59)
GFR, EST NON AFRICAN AMERICAN: 94 mL/min/{1.73_m2} (ref >59)
GLOBULIN, TOTAL: 2.9 g/dL (ref 1.5–4.5)
GLUCOSE: 82 mg/dL (ref 65–99)
Potassium: 4.5 mmol/L (ref 3.5–5.2)
Sodium: 142 mmol/L (ref 134–144)
TOTAL PROTEIN: 6.9 g/dL (ref 6.0–8.5)

## 2015-07-24 LAB — CBC WITH DIFFERENTIAL/PLATELET
BASOS: 0 %
Basophils Absolute: 0 10*3/uL (ref 0.0–0.2)
EOS (ABSOLUTE): 0 10*3/uL (ref 0.0–0.4)
EOS: 1 %
HEMOGLOBIN: 12.8 g/dL (ref 11.1–15.9)
Hematocrit: 38.9 % (ref 34.0–46.6)
IMMATURE GRANULOCYTES: 0 %
Immature Grans (Abs): 0 10*3/uL (ref 0.0–0.1)
LYMPHS ABS: 1 10*3/uL (ref 0.7–3.1)
Lymphs: 21 %
MCH: 25.1 pg — ABNORMAL LOW (ref 26.6–33.0)
MCHC: 32.9 g/dL (ref 31.5–35.7)
MCV: 76 fL — AB (ref 79–97)
MONOCYTES: 9 %
Monocytes Absolute: 0.4 10*3/uL (ref 0.1–0.9)
Neutrophils Absolute: 3.2 10*3/uL (ref 1.4–7.0)
Neutrophils: 69 %
Platelets: 424 10*3/uL — ABNORMAL HIGH (ref 150–379)
RBC: 5.09 x10E6/uL (ref 3.77–5.28)
RDW: 19.8 % — AB (ref 12.3–15.4)
WBC: 4.7 10*3/uL (ref 3.4–10.8)

## 2015-07-25 ENCOUNTER — Telehealth: Payer: Self-pay | Admitting: *Deleted

## 2015-07-25 NOTE — Telephone Encounter (Signed)
Relayed that lab results ok, consistent with previous lab work.  She verbalized understanding.

## 2015-07-25 NOTE — Telephone Encounter (Signed)
I called and no answer. (could not LM).

## 2015-07-25 NOTE — Telephone Encounter (Signed)
-----   Message from Ward Givens, NP sent at 07/24/2015  2:30 PM EDT ----- Blood work ok. Consistent with prior blood work. Please call patient.

## 2015-08-09 ENCOUNTER — Ambulatory Visit
Admission: RE | Admit: 2015-08-09 | Discharge: 2015-08-09 | Disposition: A | Discharge status: Home / Self Care | Payer: PPO | Source: Ambulatory Visit | Attending: Adult Health | Admitting: Adult Health

## 2015-08-09 DIAGNOSIS — G35 Multiple sclerosis: Secondary | ICD-10-CM | POA: Diagnosis not present

## 2015-08-09 MED ORDER — GADOBENATE DIMEGLUMINE 529 MG/ML IV SOLN
18.0000 mL | Freq: Once | INTRAVENOUS | Status: AC | PRN
  Administered 2015-08-09: 18 mL via INTRAVENOUS

## 2015-08-12 ENCOUNTER — Telehealth: Payer: Self-pay | Admitting: *Deleted

## 2015-08-12 NOTE — Telephone Encounter (Signed)
-----   Message from Ward Givens, NP sent at 08/12/2015  7:29 AM EDT ----- MRI shows no change compared to previous MRI. Please call patient

## 2015-08-12 NOTE — Telephone Encounter (Signed)
I spoke to pt and let her know that the MRI was unchanged from her previous MRI.  She verbalized understanding.

## 2015-09-03 ENCOUNTER — Other Ambulatory Visit: Payer: Self-pay | Admitting: Neurology

## 2015-09-10 ENCOUNTER — Telehealth: Payer: Self-pay | Admitting: Neurology

## 2015-09-10 NOTE — Telephone Encounter (Signed)
Spoke to [place] - states the numbness, tingling and pain in her left arm is worse.  States her medications are not helpful and she would like to talk over possible changes.  She has been placed on Carolyn's schedule for further discussion of treatment/med options.

## 2015-09-10 NOTE — Telephone Encounter (Signed)
Patient called to advise for past several days, feels like nerves or muscles in left arm, numbness, feels like pulling, "not all day but it be paining", also states she gets weak. Please call.

## 2015-09-11 ENCOUNTER — Ambulatory Visit (INDEPENDENT_AMBULATORY_CARE_PROVIDER_SITE_OTHER): Payer: PPO | Admitting: Nurse Practitioner

## 2015-09-11 ENCOUNTER — Encounter: Payer: Self-pay | Admitting: Nurse Practitioner

## 2015-09-11 VITALS — BP 105/64 | HR 70 | Ht 62.0 in | Wt 191.4 lb

## 2015-09-11 DIAGNOSIS — R209 Unspecified disturbances of skin sensation: Secondary | ICD-10-CM | POA: Diagnosis not present

## 2015-09-11 MED ORDER — GABAPENTIN 300 MG PO CAPS: 600.0000 mg | ORAL_CAPSULE | Freq: Four times a day (QID) | ORAL | 6 refills | Status: DC

## 2015-09-11 NOTE — Progress Notes (Signed)
I reviewed above note and agree with the assessment and plan.  Rosalin Hawking, MD PhD Stroke Neurology 09/11/2015 6:39 PM

## 2015-09-11 NOTE — Progress Notes (Signed)
[facility]  PATIENT: Andrea Cantrell DOB: 04-13-1950   REASON FOR VISIT: Follow-up for multiple sclerosis, numbness and tingling pain HISTORY FROM: Patient    HISTORY OF PRESENT ILLNESS:UPDATE 09/11/15 CMMs. Andrea Cantrell is a 65 year old female with a history of relapsed remitting multiple sclerosis. She was last seen in the office by [facility] 07/23/2015. She continues on Tecfidera and tolerates it well. She denies any new weakness. She is complaining of more left arm numbness and tingling .She denies any changes with the bowels or bladder. Denies any significant changes with her gait or balance. She denies any recent falls  She does report some blurred vision however after her recent eye exam  glasses were recommended  She does complain of some neck stiffness that radiates to the right shoulder. She is on baclofen which helps with this  She does have some low back pain that has been ongoing. She does not feel that her pain has gotten worse. She states that her pain is usually dependent on her activity level for the day. She takes tramadol  to control her discomfort.  She returns today for an evaluation.  HISTORY (YAN): Andrea Cantrell, is a 65 year old female returns for followup of relapsing remitting multiple sclerosis  She has a history of multiple sclerosis since 1990s, also with past medical history of hypertension, hyperlipidemia, fibromyalgia, and discoid lupus in the 1990s. She rarely has a flareup from her lupus   She has episodes of gait difficulty, in 2006, took about 6 months to recover. From then on, she kept on having episodes of worsening gait difficulty, blurry vision. Diagnosis was made in 2013, based upon abnormal MRI.  MRI scan of the brain shows multiple periventricular lesions solitary enhancing left frontal subcortical lesion with multiple nonenhancing periventricular and subcortical hypointense lesions consistent with myelinating disease.   MRI of the  cervical spine with mild degenerative changes but no enhancing lesions are noted.   She was started on tecfidera in October 2013 and has tolerated the medication extremely well. Initially she thought she developed a rash but apparently that was due to a GI virus and that has disappeared.   UPDATE June 10th 2015:  She still complains of low back, lower extremity pain, muscle spasm at her toes, she went to hospital in Jul 05 2013, complains of chest tightness, chest pain, near syncope episode, EKG was normal, chest x-ray was normal, laboratory showed normal CBC, CMP with exception of mild elevated alkaline phosphate 140, she was diagnosed with dehydration, which has improved with IV fluid, and pain medications, normal nuclear stress test in October 2015  She now complains of left neck pain, bilateral lower extremity pain  UPDATE Feb 8th 2016: She complains of right knee pain, right foot numbness since Mar 18 2014, going up her right leg, when she put pressure on her right foot, she noticed right toe pain, going up her right leg, She also complains of low back pain, going down her left hip and left leg, she also has right arm intermittent sharp pain.  She went to ED in Dec 27th 2015 for worsening left side low back pain, shooting pain to left leg.  UPDATE June 2nd 2016: We have reviewed MRI lumbar in February 2016, severe facet arthrosis at L4-5 with new grade 1 anterolisthesis and severe spinal stenosis. Moderate multifactorial spinal stenosis and moderate left neural foraminal stenosis at L3-4.  She has developed severe right shoulder pain in March 2016, hematoma at right forearm,  MRI right shoulder March 24th 2016: Complete tear of the supraspinatus tendon with 3.4 cm of retraction. Complete tear of the intraarticular portion of the long head of the biceps tendon. Mild osteoarthritis of the glenohumeral joint. Moderate degenerative changes of the acromioclavicular joint.  She will have  right rotator cuff surgery in July 23 2014 by Dr. Veverly Fells, has stopped aspirin in June first 2016, ibuprofen 5 days prior to that.  She also complains of chronic low back pain, worsening with prolonged walking, or bearing weight, is taking tramadol 50 mg 2 tablets twice a day, which has been effective.  UPDATE Dec 5th 2016: She did have right rotator cuff surgery in June of 2016 by orthopedic surgeon Dr. Veverly Fells, recovered very well, she continue have low back pain, radiating pain to bilateral lower extremity, especially early morning, she denies bowel and bladder incontinence, no significant gait difficulty, but complains of increased low back pain, radiating pain to bilateral lower extremity and lack of stamina We have personally reviewed MRI of lumbar spine in February 2016, multilevel degenerative changes, most severe at L4-5, with moderate to severe spinal canal stenosis   REVIEW OF SYSTEMS: Full 14 system review of systems performed and notable only for those listed, all others are neg:  Constitutional: neg  Cardiovascular: neg Ear/Nose/Throat: neg  Skin: neg Eyes: Blurred vision Respiratory: neg Gastroitestinal: neg  Hematology/Lymphatic: Easy bruising  Endocrine: neg Musculoskeletal: Joint pain, neck pain Allergy/Immunology: neg Neurological: Numbness in the left arm, weakness Psychiatric: neg Sleep : neg   ALLERGIES: No Known Allergies  HOME MEDICATIONS: Outpatient Medications Prior to Visit  Medication Sig Dispense Refill  . albuterol (PROVENTIL HFA;VENTOLIN HFA) 108 (90 BASE) MCG/ACT inhaler Inhale 2 puffs into the lungs every 6 (six) hours as needed for wheezing or shortness of breath (wheezing and shortness of breath). For shortness of breath.    Marland Kitchen amLODipine (NORVASC) 5 MG tablet Take 5 mg by mouth every morning.     Marland Kitchen aspirin EC 81 MG tablet Take 162 mg by mouth every evening.     Marland Kitchen atorvastatin (LIPITOR) 10 MG tablet Take 10 mg by mouth every morning.     . baclofen  (LIORESAL) 10 MG tablet Take 1 tablet (10 mg total) by mouth 3 (three) times daily. 270 tablet 3  . DULoxetine (CYMBALTA) 60 MG capsule Take 1 capsule (60 mg total) by mouth daily. 30 capsule 11  . gabapentin (NEURONTIN) 300 MG capsule Take 2 capsules (600 mg total) by mouth 3 (three) times daily. 540 capsule 3  . ibuprofen (ADVIL,MOTRIN) 200 MG tablet Take 800 mg by mouth every 6 (six) hours as needed for moderate pain (right arm pain).     . TECFIDERA 240 MG CPDR TAKE ONE CAPSULE (240 MG) BY MOUTH TWO TIMES DAILY . STORE AT ROOM TEMP IN THE ORIGINAL CONTAINER. DO NOT CRUSH OR CHEW. SWALLOW WHOLE. 180 capsule 3  . traMADol (ULTRAM) 50 MG tablet Take 1 tablet (50 mg total) by mouth every 8 (eight) hours as needed. 90 tablet 5  . VITAMIN D, CHOLECALCIFEROL, PO Take 3,000 Units by mouth daily.    . meclizine (ANTIVERT) 25 MG tablet Take 1 tablet (25 mg total) by mouth 3 (three) times daily as needed for dizziness. (Patient not taking: Reported on 09/11/2015) 45 tablet 0   No facility-administered medications prior to visit.     PAST MEDICAL HISTORY: Past Medical History:  Diagnosis Date  . Discoid lupus   . Essential hypertension   . Fibromyalgia   .  Heavy smoker (more than 20 cigarettes per day)    Was able to stop for 11 years but restarted about 10 years ago  . Hyperlipidemia with target LDL less than 130   . Andrea (multiple sclerosis) ([place])   . Obesity (BMI 35.0-39.9 without comorbidity) ([place])   . Spinal stenosis     PAST SURGICAL HISTORY: Past Surgical History:  Procedure Laterality Date  . ABDOMINAL HYSTERECTOMY  1995  . BREAST SURGERY Left    tissue removal  . [place]  . CHOLECYSTECTOMY  1994  . NM MYOVIEW LTD  October 2015   EF 71%. No ischemia or infarction. Low risk/normal  . ROTATOR CUFF REPAIR Left June 2016    FAMILY HISTORY: Family History  Problem Relation Age of Onset  . Cancer Mother   . Cancer Father   . Diabetes Brother   . Diabetes  Maternal Grandmother     SOCIAL HISTORY: Social History   Social History  . Marital status: Legally Separated    Spouse name: N/A  . Number of children: 2  . Years of education: 12+   Occupational History  . Not on file.   Social History Main Topics  . Smoking status: Current Every Day Smoker    Packs/day: 1.00    Types: Cigarettes  . Smokeless tobacco: Never Used  . Alcohol use No  . Drug use: No  . Sexual activity: Not on file   Other Topics Concern  . Not on file   Social History Narrative   Patient lives at home with granddaughter her her 2 children.    Patient is separated. Patient has 2 children.    Patient has some college. Patient is on Disability.    Smokes roughly one pack a day. No alcohol.     PHYSICAL EXAM  Vitals:   09/11/15 1500  BP: 105/64  Pulse: 70  Weight: 191 lb 6.4 oz (86.8 kg)  Height: 5\' 2"  (1.575 m)   Body mass index is 35.01 kg/m. Generalized: Well developed, Obese female in no acute distress   Neurological examination  Mentation: Alert oriented to time, place, history taking. Follows all commands speech and language fluent Cranial nerve II-XII: Pupils were equal round reactive to light. Extraocular movements were full, visual field were full on confrontational test. Facial sensation and strength were normal. Uvula tongue midline. Head turning and shoulder shrug  were normal and symmetric. Motor: The motor testing reveals 5 over 5 strength of all 4 extremities. Good symmetric motor tone is noted throughout.  Sensory: Sensory testing is intact to soft touch, pinprick and vibratory  on all 4 extremities. No evidence of extinction is noted.  Coordination: Cerebellar testing reveals good finger-nose-finger and heel-to-shin bilaterally.  Gait and station: Gait is normal. Tandem gait is normal. Romberg is negative. No drift is seen.  Reflexes: Deep tendon reflexes are symmetric and normal bilaterally.    DIAGNOSTIC DATA (LABS, IMAGING,  TESTING) - I reviewed patient records, labs, notes, testing and imaging myself where available.  Lab Results  Component Value Date   WBC 4.7 07/23/2015   HGB 12.5 04/13/2014   HCT 38.9 07/23/2015   MCV 76 (L) 07/23/2015   PLT 424 (H) 07/23/2015      Component Value Date/Time   NA 142 07/23/2015 1408   K 4.5 07/23/2015 1408   CL 103 07/23/2015 1408   CO2 24 07/23/2015 1408   GLUCOSE 82 07/23/2015 1408   GLUCOSE 93 04/13/2014 0804  BUN 7 (L) 07/23/2015 1408   CREATININE 0.64 07/23/2015 1408   CALCIUM 9.3 07/23/2015 1408   PROT 6.9 07/23/2015 1408   ALBUMIN 4.0 07/23/2015 1408   AST 16 07/23/2015 1408   ALT 8 07/23/2015 1408   ALKPHOS 116 07/23/2015 1408   BILITOT 0.4 07/23/2015 1408   GFRNONAA 94 07/23/2015 1408   GFRAA 108 07/23/2015 1408     ASSESSMENT AND PLAN 65 y.o. year old female  has a past medical history of Essential hypertension; Fibromyalgia; Discoid lupus; Andrea (multiple sclerosis) ([place]); Hyperlipidemia with target LDL less than 130; Obesity (BMI 35.0-39.9 without comorbidity) ([place]); Heavy smoker (more than 20 cigarettes per day); and Spinal stenosis. here with:  1. Multiple sclerosis 2. Spinal stenosis 3. Continued numbness and tingling pain in the left arm  Continue Cymbalta 60 mg daily Continue tecfidera MRI of the brain and blood work just checked in June  And results reviewed. Increase gabapentin to 2 capsules 4 times daily at 11 AM and 3 PM and 7 PM and bedtime Follow-up with Jinny Blossom in September The patient is a current patient of Dr. Krista Blue and Ward Givens   who are out of the office today . This note is sent to the work in doctor.    Dennie Bible, [facility], [facility], APRN  [facility] [address], [place], [place] [address] [phone]

## 2015-09-11 NOTE — Patient Instructions (Addendum)
Continue Cymbalta 60 mg daily Continue tecfidera MRI of the brain and blood work just checked in June  Increase gabapentin to 2 capsules 4 times daily at 11 AM and 3 PM and 7 PM and bedtime Follow-up with Jinny Blossom in September

## 2015-09-23 ENCOUNTER — Other Ambulatory Visit: Payer: Self-pay | Admitting: Neurology

## 2015-09-29 ENCOUNTER — Encounter (HOSPITAL_COMMUNITY): Payer: Self-pay | Admitting: Emergency Medicine

## 2015-09-29 ENCOUNTER — Emergency Department (HOSPITAL_COMMUNITY): Payer: PPO

## 2015-09-29 ENCOUNTER — Emergency Department (HOSPITAL_COMMUNITY)
Admission: EM | Admit: 2015-09-29 | Discharge: 2015-09-29 | Disposition: A | Discharge status: Home / Self Care | Payer: PPO | Attending: Emergency Medicine | Admitting: Emergency Medicine

## 2015-09-29 DIAGNOSIS — I10 Essential (primary) hypertension: Secondary | ICD-10-CM | POA: Insufficient documentation

## 2015-09-29 DIAGNOSIS — Z791 Long term (current) use of non-steroidal anti-inflammatories (NSAID): Secondary | ICD-10-CM | POA: Diagnosis not present

## 2015-09-29 DIAGNOSIS — Z7982 Long term (current) use of aspirin: Secondary | ICD-10-CM | POA: Diagnosis not present

## 2015-09-29 DIAGNOSIS — F17210 Nicotine dependence, cigarettes, uncomplicated: Secondary | ICD-10-CM | POA: Diagnosis not present

## 2015-09-29 DIAGNOSIS — Z79899 Other long term (current) drug therapy: Secondary | ICD-10-CM | POA: Insufficient documentation

## 2015-09-29 DIAGNOSIS — M79602 Pain in left arm: Secondary | ICD-10-CM | POA: Insufficient documentation

## 2015-09-29 DIAGNOSIS — M25512 Pain in left shoulder: Secondary | ICD-10-CM | POA: Diagnosis not present

## 2015-09-29 MED ORDER — KETOROLAC TROMETHAMINE 60 MG/2ML IM SOLN
30.0000 mg | Freq: Once | INTRAMUSCULAR | Status: AC
  Administered 2015-09-29: 30 mg via INTRAMUSCULAR
  Filled 2015-09-29: qty 2

## 2015-09-29 MED ORDER — PREDNISONE 10 MG PO TABS: 20.0000 mg | ORAL_TABLET | Freq: Two times a day (BID) | ORAL | 0 refills | Status: DC

## 2015-09-29 MED ORDER — PREDNISONE 20 MG PO TABS
60.0000 mg | ORAL_TABLET | Freq: Once | ORAL | Status: AC
  Administered 2015-09-29: 60 mg via ORAL
  Filled 2015-09-29: qty 3

## 2015-09-29 NOTE — ED Triage Notes (Signed)
Patient presents for intermittent left arm x2 weeks. No known injury. Seen by neurologist two week ago, increased Neurontin without relief. Denies SOB, N/V, weakness, back pain. A&O x4.

## 2015-09-29 NOTE — Discharge Instructions (Signed)
Follow up with orthopedics as soon as possible. Use the sling as needed for comfort. The bone spur in the left shoulder may be contributing to your symptoms. Continue to use ibuprofen or naproxen for pain and reduce inflammation.

## 2015-09-29 NOTE — ED Provider Notes (Signed)
[facility] DEPT Provider Note   CSN: [id number] Arrival date & time: 09/29/15  1411  First Provider Contact:  First MD Initiated Contact with Patient 09/29/15 1639        History   Chief Complaint Chief Complaint  Patient presents with  . Arm Pain    HPI Andrea Cantrell is a 65 y.o. female.  HPI   Andrea Cantrell is a 65 y.o. female, with a history of Lupus, RR MS, and fibromyalgia presenting to the ED with left arm pain for over a month. Pain is described as an intense aching pain, intermittent, accompanied by tingling, radiating from the hand into the left shoulder. Neurologist increased gabapentin two weeks ago without relief. Has also tried ibuprofen with some relief. Pt has an upcoming orthopedic appointment with Dr. Esmond Plants on August 22. Pt has some suspicion that the pain may be coming from nerve compression in the shoulder. Pt denies fever/chills, N/V, dizziness, weakness, recent falls/trauma, or any other complaints.    Past Medical History:  Diagnosis Date  . Discoid lupus   . Essential hypertension   . Fibromyalgia   . Heavy smoker (more than 20 cigarettes per day)    Was able to stop for 11 years but restarted about 10 years ago  . Hyperlipidemia with target LDL less than 130   . MS (multiple sclerosis) ([place])   . Obesity (BMI 35.0-39.9 without comorbidity) ([place])   . Spinal stenosis     Patient Active Problem List   Diagnosis Date Noted  . Lumbar radiculopathy 04/17/2014  . Dizziness 04/13/2014  . Acute bronchitis 04/13/2014  . Hypoxia 04/12/2014  . Left-sided low back pain with left-sided sciatica 03/26/2014  . DOE (dyspnea on exertion) 11/20/2013  . Chest pain with moderate risk for cardiac etiology 11/20/2013  . Essential hypertension   . Hyperlipidemia with target LDL less than 130   . Obesity (BMI 35.0-39.9 without comorbidity) ([place])   . Multiple sclerosis ([place]) 07/26/2012  . Other nonspecific abnormal result of function study of brain  and central nervous system 07/26/2012  . Disturbance of skin sensation 07/26/2012    Past Surgical History:  Procedure Laterality Date  . ABDOMINAL HYSTERECTOMY  1995  . BREAST SURGERY Left    tissue removal  . [place]  . CHOLECYSTECTOMY  1994  . NM MYOVIEW LTD  October 2015   EF 71%. No ischemia or infarction. Low risk/normal  . ROTATOR CUFF REPAIR Left June 2016    OB History    No data available       Home Medications    Prior to Admission medications   Medication Sig Start Date End Date Taking? Authorizing Provider  albuterol (PROVENTIL HFA;VENTOLIN HFA) 108 (90 BASE) MCG/ACT inhaler Inhale 2 puffs into the lungs every 6 (six) hours as needed for wheezing or shortness of breath (wheezing and shortness of breath). For shortness of breath.   Yes Historical Provider, MD  amLODipine (NORVASC) 5 MG tablet Take 5 mg by mouth every morning.    Yes Historical Provider, MD  aspirin EC 81 MG tablet Take 162 mg by mouth every evening.    Yes Historical Provider, MD  atorvastatin (LIPITOR) 10 MG tablet Take 10 mg by mouth every morning.    Yes Historical Provider, MD  baclofen (LIORESAL) 10 MG tablet Take 1 tablet (10 mg total) by mouth 3 (three) times daily. 07/23/15  Yes Ward Givens, NP  DULoxetine (CYMBALTA) 60 MG capsule Take 1  capsule (60 mg total) by mouth daily. 07/23/15  Yes Ward Givens, NP  gabapentin (NEURONTIN) 300 MG capsule Take 2 capsules (600 mg total) by mouth 4 (four) times daily. 09/11/15  Yes Dennie Bible, NP  ibuprofen (ADVIL,MOTRIN) 200 MG tablet Take 800 mg by mouth every 6 (six) hours as needed for moderate pain (right arm pain).    Yes Historical Provider, MD  Safflower Oil OIL Take 1 capsule by mouth daily.   Yes Historical Provider, MD  TECFIDERA 240 MG CPDR TAKE ONE CAPSULE (240 MG) BY MOUTH TWO TIMES DAILY . STORE AT ROOM TEMP IN THE ORIGINAL CONTAINER. DO NOT CRUSH OR CHEW. SWALLOW WHOLE. 09/03/15  Yes Marcial Pacas, MD  traMADol  (ULTRAM) 50 MG tablet Take 1 tablet (50 mg total) by mouth every 8 (eight) hours as needed. Patient taking differently: Take 50-100 mg by mouth every 8 (eight) hours as needed for moderate pain or severe pain.  07/23/15  Yes Ward Givens, NP  TURMERIC PO Take 1 tablet by mouth every evening.   Yes Historical Provider, MD  VITAMIN D, CHOLECALCIFEROL, PO Take 3,000 Units by mouth every evening.    Yes Historical Provider, MD  predniSONE (DELTASONE) 10 MG tablet Take 2 tablets (20 mg total) by mouth 2 (two) times daily with a meal. 09/29/15   Mohannad Olivero C Saqib Cazarez, PA-C    Family History Family History  Problem Relation Age of Onset  . Cancer Mother   . Cancer Father   . Diabetes Brother   . Diabetes Maternal Grandmother     Social History Social History  Substance Use Topics  . Smoking status: Current Every Day Smoker    Packs/day: 1.00    Types: Cigarettes  . Smokeless tobacco: Never Used  . Alcohol use No     Allergies   Review of patient's allergies indicates no known allergies.   Review of Systems Review of Systems  Constitutional: Negative for chills, diaphoresis and fever.  Respiratory: Negative for shortness of breath.   Cardiovascular: Negative for chest pain.  Gastrointestinal: Negative for nausea and vomiting.  Musculoskeletal: Positive for arthralgias and myalgias. Negative for back pain and joint swelling.  Skin: Negative for color change, pallor and wound.  Neurological: Positive for numbness (occasional). Negative for dizziness, weakness and light-headedness.  All other systems reviewed and are negative.    Physical Exam Updated Vital Signs BP 136/76 (BP Location: Right Arm)   Pulse 75   Temp 98.3 F (36.8 C) (Oral)   Resp 18   SpO2 97%   Physical Exam  Constitutional: She is oriented to person, place, and time. She appears well-developed and well-nourished. No distress.  HENT:  Head: Normocephalic and atraumatic.  Eyes: Conjunctivae and EOM are normal.  Pupils are equal, round, and reactive to light.  Neck: Neck supple.  Cardiovascular: Normal rate, regular rhythm, normal heart sounds and intact distal pulses.   Pulmonary/Chest: Effort normal and breath sounds normal. No respiratory distress.  Abdominal: There is no guarding.  Musculoskeletal: She exhibits tenderness. She exhibits no edema.  Tenderness to the anterior left shoulder. Full ROM in all extremities and spine. ROM increases pain in the left shoulder and down the left arm. No midline spinal tenderness.   Lymphadenopathy:    She has no cervical adenopathy.  Neurological: She is alert and oriented to person, place, and time. She has normal reflexes.  No sensory deficits. Strength 5/5 in all extremities. No gait disturbance. Coordination intact. Cranial nerves III-XII grossly intact. No  facial droop.   Skin: Skin is warm and dry. She is not diaphoretic.  Psychiatric: She has a normal mood and affect. Her behavior is normal.  Nursing note and vitals reviewed.    ED Treatments / Results  Labs (all labs ordered are listed, but only abnormal results are displayed) Labs Reviewed - No data to display  EKG  EKG Interpretation None       Radiology Dg Shoulder Left  Result Date: 09/29/2015 CLINICAL DATA:  LEFT shoulder pain for 1 month, no known injury, history of multiple sclerosis, lupus, hypertension, fibromyalgia, smoker EXAM: LEFT SHOULDER - 2+ VIEW COMPARISON:  None FINDINGS: Low normal osseous mineralization. AC joint alignment normal. Inferior acromial spur formation. No acute fracture, dislocation, or bone destruction. Visualized LEFT ribs intact. IMPRESSION: No acute abnormalities. Inferior acromial spur formation, which may predispose the patient to rotator cuff pathology. Electronically Signed   By: Lavonia Dana M.D.   On: 09/29/2015 17:44    Procedures Procedures (including critical care time)  Medications Ordered in ED Medications  predniSONE (DELTASONE) tablet 60  mg (60 mg Oral Given 09/29/15 1723)  ketorolac (TORADOL) injection 30 mg (30 mg Intramuscular Given 09/29/15 1723)     Initial Impression / Assessment and Plan / ED Course  I have reviewed the triage vital signs and the nursing notes.  Pertinent labs & imaging results that were available during my care of the patient were reviewed by me and considered in my medical decision making (see chart for details).  Clinical Course    Chakayla Sasnett Vento presents with left arm pain for the last month.  No neuro or functional deficits. Acromial spur you may be causing some of the patient's symptoms. Patient to follow-up with orthopedics as soon as possible. The patient was given instructions for home care as well as return precautions. Patient voices understanding of these instructions, accepts the plan, and is comfortable with discharge.  Vitals:   09/29/15 1437 09/29/15 1728  BP: 136/76 140/80  Pulse: 75 60  Resp: 18 18  Temp: 98.3 F (36.8 C)   TempSrc: Oral   SpO2: 97% 98%     Final Clinical Impressions(s) / ED Diagnoses   Final diagnoses:  Left arm pain    New Prescriptions Discharge Medication List as of 09/29/2015  6:45 PM    START taking these medications   Details  predniSONE (DELTASONE) 10 MG tablet Take 2 tablets (20 mg total) by mouth 2 (two) times daily with a meal., Starting Sun 09/29/2015, Print         Lorayne Bender, PA-C 09/30/15 [place], MD 09/30/15 [id number]

## 2015-09-30 ENCOUNTER — Telehealth: Payer: Self-pay | Admitting: Nurse Practitioner

## 2015-09-30 NOTE — Telephone Encounter (Signed)
Spoke with pt and relayed will let providers know.  Taking motrin as well, which helps with the pain.

## 2015-09-30 NOTE — Telephone Encounter (Signed)
Telephone call to patient. Her increased numbness is probably coming from the bone spur. Follow-up with her orthopedist for further treatment.Already has appt. she is agreeable to this

## 2015-09-30 NOTE — Telephone Encounter (Signed)
Andrea Cantrell last saw the patient. It appears gabapentin was increased due to numbness/tingling in left arm. She has an appointment with orthopedist. She may want to get evaluated by them before we make any adjustments. I will let [name] weigh in on this issue since she last saw her.

## 2015-09-30 NOTE — Telephone Encounter (Signed)
Pt called said she increased the gabapentin (NEURONTIN) 300 MG capsule as directed but it did not help. She went to the ED yesterday and had an xray. She was told she had a bone spur on her shoulder and was given an injection of toradol and prednisone 3 tablets and RX for prednisone for this week. She already made an appt with orthopaedic surgeon Dr Esmond Plants.

## 2015-10-08 DIAGNOSIS — M79602 Pain in left arm: Secondary | ICD-10-CM | POA: Diagnosis not present

## 2015-10-10 ENCOUNTER — Telehealth: Payer: Self-pay | Admitting: Neurology

## 2015-10-10 NOTE — Telephone Encounter (Signed)
Patient called to advise, went to ER August 13th, diagnosed with bone spur Left shoulder, saw Orthopaedic Doctor August 22nd, states he suggested to call Dr. Krista Blue and ask about doing nerve test on Left side. Please call [phone].

## 2015-10-10 NOTE — Telephone Encounter (Signed)
Spoke to patient - she is seeing Dr. Veverly Fells for her shoulder pain.  She has a pending MRI and follow up with him.  She is going to wait until her follow up for results to further discuss if she needs a NCV/EMG.  If so, she is going to ask Dr. Veverly Fells to order for completion at our office.

## 2015-10-14 NOTE — Telephone Encounter (Signed)
Pt called back today said he is "having rt foot numbness and left knee gives out".  Pt doesn't know if this is related to MS or not? Please call

## 2015-10-14 NOTE — Telephone Encounter (Signed)
Spoke to Andrea Cantrell - states her knee has been hurting for some time now.  The pain worsens when she twist it certain ways and makes her leg feel like it is going to give out.  She saw ortho last week for her shoulder and they are setting her up an appt to evaluate her knee.  Also, says her foot numbness is not new and she is going to call for an appt w/ her foot doctor.

## 2015-10-15 ENCOUNTER — Emergency Department (HOSPITAL_COMMUNITY): Payer: PPO

## 2015-10-15 ENCOUNTER — Encounter (HOSPITAL_COMMUNITY): Payer: Self-pay

## 2015-10-15 ENCOUNTER — Emergency Department (HOSPITAL_COMMUNITY)
Admission: EM | Admit: 2015-10-15 | Discharge: 2015-10-15 | Disposition: A | Discharge status: Home / Self Care | Payer: PPO | Attending: Emergency Medicine | Admitting: Emergency Medicine

## 2015-10-15 DIAGNOSIS — F17210 Nicotine dependence, cigarettes, uncomplicated: Secondary | ICD-10-CM | POA: Diagnosis not present

## 2015-10-15 DIAGNOSIS — M25562 Pain in left knee: Secondary | ICD-10-CM | POA: Diagnosis not present

## 2015-10-15 DIAGNOSIS — M25552 Pain in left hip: Secondary | ICD-10-CM | POA: Diagnosis not present

## 2015-10-15 DIAGNOSIS — Z791 Long term (current) use of non-steroidal anti-inflammatories (NSAID): Secondary | ICD-10-CM | POA: Insufficient documentation

## 2015-10-15 DIAGNOSIS — M5432 Sciatica, left side: Secondary | ICD-10-CM | POA: Diagnosis not present

## 2015-10-15 DIAGNOSIS — Z7982 Long term (current) use of aspirin: Secondary | ICD-10-CM | POA: Insufficient documentation

## 2015-10-15 DIAGNOSIS — I10 Essential (primary) hypertension: Secondary | ICD-10-CM | POA: Insufficient documentation

## 2015-10-15 DIAGNOSIS — M5442 Lumbago with sciatica, left side: Secondary | ICD-10-CM | POA: Diagnosis not present

## 2015-10-15 DIAGNOSIS — Z79899 Other long term (current) drug therapy: Secondary | ICD-10-CM | POA: Diagnosis not present

## 2015-10-15 MED ORDER — IBUPROFEN 800 MG PO TABS: 800.0000 mg | ORAL_TABLET | Freq: Three times a day (TID) | ORAL | 0 refills | Status: DC

## 2015-10-15 MED ORDER — HYDROCODONE-ACETAMINOPHEN 5-325 MG PO TABS
1.0000 | ORAL_TABLET | Freq: Once | ORAL | Status: AC
  Administered 2015-10-15: 1 via ORAL
  Filled 2015-10-15: qty 1

## 2015-10-15 MED ORDER — IBUPROFEN 200 MG PO TABS: 600.0000 mg | ORAL_TABLET | Freq: Once | ORAL | Status: DC

## 2015-10-15 NOTE — ED Provider Notes (Signed)
[facility] DEPT Provider Note   CSN: [id number] Arrival date & time: 10/15/15  0900     History   Chief Complaint Chief Complaint  Patient presents with  . Leg Pain    HPI Andrea Cantrell is a 65 y.o. female with a pmhx of Relapsing/remitting MS, fibromyalgia, spinal stenosis who presents to the ED today complaining of left leg pain. Patient states that for the last week she has been experiencing pain in her left hip that radiates down her left leg into her left knee. Patient states occasionally her left leg will feel numb. Patient has been taking home tramadol, ibuprofen and gabapentin is minimal relief. Patient is able to ambulance and states this is difficult for her. Patient has had similar symptoms in the past on her right side and was told this was sciatica. Patient had MRI of her lumbar spine last year which revealed severe spinal stenosis. She is followed by neurology and orthopedics. She denies any bowel or bladder incontinence, trauma or injury, weakness, swelling, calf pain.  HPI  Past Medical History:  Diagnosis Date  . Discoid lupus   . Essential hypertension   . Fibromyalgia   . Heavy smoker (more than 20 cigarettes per day)    Was able to stop for 11 years but restarted about 10 years ago  . Hyperlipidemia with target LDL less than 130   . MS (multiple sclerosis) ([place])   . Obesity (BMI 35.0-39.9 without comorbidity) ([place])   . Spinal stenosis     Patient Active Problem List   Diagnosis Date Noted  . Lumbar radiculopathy 04/17/2014  . Dizziness 04/13/2014  . Acute bronchitis 04/13/2014  . Hypoxia 04/12/2014  . Left-sided low back pain with left-sided sciatica 03/26/2014  . DOE (dyspnea on exertion) 11/20/2013  . Chest pain with moderate risk for cardiac etiology 11/20/2013  . Essential hypertension   . Hyperlipidemia with target LDL less than 130   . Obesity (BMI 35.0-39.9 without comorbidity) ([place])   . Multiple sclerosis ([place]) 07/26/2012  . Other  nonspecific abnormal result of function study of brain and central nervous system 07/26/2012  . Disturbance of skin sensation 07/26/2012    Past Surgical History:  Procedure Laterality Date  . ABDOMINAL HYSTERECTOMY  1995  . BREAST SURGERY Left    tissue removal  . [place]  . CHOLECYSTECTOMY  1994  . NM MYOVIEW LTD  October 2015   EF 71%. No ischemia or infarction. Low risk/normal  . ROTATOR CUFF REPAIR Left June 2016    OB History    No data available       Home Medications    Prior to Admission medications   Medication Sig Start Date End Date Taking? Authorizing Provider  albuterol (PROVENTIL HFA;VENTOLIN HFA) 108 (90 BASE) MCG/ACT inhaler Inhale 2 puffs into the lungs every 6 (six) hours as needed for wheezing or shortness of breath (wheezing and shortness of breath). For shortness of breath.   Yes Historical Provider, MD  amLODipine (NORVASC) 5 MG tablet Take 5 mg by mouth every morning.    Yes Historical Provider, MD  aspirin EC 81 MG tablet Take 162 mg by mouth every evening.    Yes Historical Provider, MD  atorvastatin (LIPITOR) 10 MG tablet Take 10 mg by mouth every morning.    Yes Historical Provider, MD  baclofen (LIORESAL) 10 MG tablet Take 1 tablet (10 mg total) by mouth 3 (three) times daily. 07/23/15  Yes Ward Givens, NP  DULoxetine (CYMBALTA) 60 MG capsule Take 1 capsule (60 mg total) by mouth daily. 07/23/15  Yes Ward Givens, NP  gabapentin (NEURONTIN) 300 MG capsule Take 2 capsules (600 mg total) by mouth 4 (four) times daily. 09/11/15  Yes Dennie Bible, NP  ibuprofen (ADVIL,MOTRIN) 200 MG tablet Take 800 mg by mouth every 6 (six) hours as needed for moderate pain (right arm pain).    Yes Historical Provider, MD  Safflower Oil OIL Take 1 capsule by mouth daily.   Yes Historical Provider, MD  TECFIDERA 240 MG CPDR TAKE ONE CAPSULE (240 MG) BY MOUTH TWO TIMES DAILY . STORE AT ROOM TEMP IN THE ORIGINAL CONTAINER. DO NOT CRUSH OR CHEW.  SWALLOW WHOLE. 09/03/15  Yes Marcial Pacas, MD  traMADol (ULTRAM) 50 MG tablet Take 1 tablet (50 mg total) by mouth every 8 (eight) hours as needed. Patient taking differently: Take 50-100 mg by mouth every 8 (eight) hours as needed for moderate pain or severe pain.  07/23/15  Yes Ward Givens, NP  TURMERIC PO Take 1 tablet by mouth every evening.   Yes Historical Provider, MD  VITAMIN D, CHOLECALCIFEROL, PO Take 3,000 Units by mouth every evening.    Yes Historical Provider, MD  predniSONE (DELTASONE) 10 MG tablet Take 2 tablets (20 mg total) by mouth 2 (two) times daily with a meal. Patient not taking: Reported on 10/15/2015 09/29/15   Lorayne Bender, PA-C    Family History Family History  Problem Relation Age of Onset  . Cancer Mother   . Cancer Father   . Diabetes Brother   . Diabetes Maternal Grandmother     Social History Social History  Substance Use Topics  . Smoking status: Current Every Day Smoker    Packs/day: 1.00    Types: Cigarettes  . Smokeless tobacco: Never Used  . Alcohol use No     Allergies   Review of patient's allergies indicates no known allergies.   Review of Systems Review of Systems  All other systems reviewed and are negative.    Physical Exam Updated Vital Signs BP 142/84 (BP Location: Right Arm)   Pulse 82   Temp 98 F (36.7 C) (Oral)   Resp 18   Ht 5\' 2"  (1.575 m)   Wt 86.6 kg   SpO2 100%   BMI 34.93 kg/m   Physical Exam  Constitutional: She is oriented to person, place, and time. She appears well-developed and well-nourished. No distress.  HENT:  Head: Normocephalic and atraumatic.  Eyes: Conjunctivae are normal. Right eye exhibits no discharge. Left eye exhibits no discharge. No scleral icterus.  Cardiovascular: Normal rate.   Pulmonary/Chest: Effort normal.  Musculoskeletal: Normal range of motion. She exhibits tenderness. She exhibits no edema or deformity.  TTP over L hip, L glute and L knee. No decrease ROM of hip or knee. Negative  anterior/poster drawer bilaterally. Negative ballottement test. No varus or valgus laxity.   Midline lumbar spinal TTP (chronic spinal stenosis). No step offs or obvious bony deformities.  Neurological: She is alert and oriented to person, place, and time. Coordination normal.  Strength 5/5 throughout. No sensory deficits. No gait abnormality.   Skin: Skin is warm and dry. No rash noted. She is not diaphoretic. No erythema. No pallor.  Psychiatric: She has a normal mood and affect. Her behavior is normal.  Nursing note and vitals reviewed.    ED Treatments / Results  Labs (all labs ordered are listed, but only abnormal results are displayed) Labs  Reviewed - No data to display  EKG  EKG Interpretation None       Radiology Dg Knee Complete 4 Views Left  Result Date: 10/15/2015 CLINICAL DATA:  Significant left hip and knee pain, onset 1 week ago. EXAM: LEFT KNEE - COMPLETE 4+ VIEW COMPARISON:  None. FINDINGS: Mild joint space narrowing and early spurring in the patellofemoral compartment. No acute bony abnormality. Specifically, no fracture, subluxation, or dislocation. Soft tissues are intact. No joint effusion. IMPRESSION: Early osteoarthritis in the patellofemoral joint. No acute findings. Electronically Signed   By: Rolm Baptise M.D.   On: 10/15/2015 11:38   Dg Hip Unilat With Pelvis 2-3 Views Left  Result Date: 10/15/2015 CLINICAL DATA:  Left hip and knee pain for 1 week.  No known injury. EXAM: DG HIP (WITH OR WITHOUT PELVIS) 2-3V LEFT COMPARISON:  None. FINDINGS: Mild symmetric spurring in the hips bilaterally. Joint spaces maintained. SI joints are symmetric and unremarkable. No acute bony abnormality. Specifically, no fracture, subluxation, or dislocation. Soft tissues are intact. IMPRESSION: Mild symmetric bilateral hip spurring.  No acute findings. Electronically Signed   By: Rolm Baptise M.D.   On: 10/15/2015 11:33    Procedures Procedures (including critical care  time)  Medications Ordered in ED Medications  HYDROcodone-acetaminophen (NORCO/VICODIN) 5-325 MG per tablet 1 tablet (1 tablet Oral Given 10/15/15 1113)     Initial Impression / Assessment and Plan / ED Course  I have reviewed the triage vital signs and the nursing notes.  Pertinent labs & imaging results that were available during my care of the patient were reviewed by me and considered in my medical decision making (see chart for details).  Clinical Course    65 y.o F with hx of MS, osteoarthritis, chronic pain who presents to the ED today c.o left hip pain that radiates down left leg. Pain travels over her glute and down posterior aspect of leg. No neurological deficits on exam. Pt ambulatory without difficulty. No red flag back pain signs or symptoms. Symptoms consistent w/ sciatica. Pt has hx of similar. Xray hip shows mild bone spurring. Pt has sever OA. Pt takes multiple pain medications as home including, gabapentin 4x/day, tramadol 3x/day, ibuprofen, baclofen. Will d/c with 800mg  ibuprofen and recommend ortho follow up. Return precautions outlined in patient discharge instructions.    Final Clinical Impressions(s) / ED Diagnoses   Final diagnoses:  Sciatica of left side  Hip pain, left    New Prescriptions Discharge Medication List as of 10/15/2015 12:58 PM    START taking these medications   Details  !! ibuprofen (ADVIL,MOTRIN) 800 MG tablet Take 1 tablet (800 mg total) by mouth 3 (three) times daily., Starting Tue 10/15/2015, Print     !! - Potential duplicate medications found. Please discuss with provider.       Dondra Spry [place], PA-C 10/16/15 [place], MD 10/19/15 [phone]

## 2015-10-15 NOTE — ED Notes (Signed)
Ambulate PT in hall. PT state she only have left knee pain while walking to restroom

## 2015-10-15 NOTE — Discharge Instructions (Signed)
Take home pain medications and ibuprofen as prescribed. Follow up with your orthopedic doctor for re-evaluation. Return to the ED if you experience severe worsening of your symptoms, inability to walk, numbness in both lower extremities, loss of control of your bowel or bladder.

## 2015-10-15 NOTE — ED Triage Notes (Signed)
Pt with pain in left hip going to left leg.  Pt pain started in July/Aug.  Pt has MS.  Pt denies injury.  Pt is ambulatory.

## 2015-10-16 DIAGNOSIS — M79602 Pain in left arm: Secondary | ICD-10-CM | POA: Diagnosis not present

## 2015-11-11 ENCOUNTER — Ambulatory Visit (INDEPENDENT_AMBULATORY_CARE_PROVIDER_SITE_OTHER): Payer: PPO | Admitting: Adult Health

## 2015-11-11 ENCOUNTER — Encounter: Payer: Self-pay | Admitting: Adult Health

## 2015-11-11 VITALS — BP 119/69 | HR 73 | Ht 62.0 in | Wt 194.2 lb

## 2015-11-11 DIAGNOSIS — G35 Multiple sclerosis: Secondary | ICD-10-CM

## 2015-11-11 DIAGNOSIS — M79605 Pain in left leg: Secondary | ICD-10-CM | POA: Diagnosis not present

## 2015-11-11 MED ORDER — GABAPENTIN 800 MG PO TABS: 800.0000 mg | ORAL_TABLET | Freq: Four times a day (QID) | ORAL | 5 refills | Status: DC

## 2015-11-11 NOTE — Patient Instructions (Addendum)
Continue Tecfidera Nerve conduction study for left leg pain If your symptoms worsen or you develop new symptoms please let us know.

## 2015-11-11 NOTE — Progress Notes (Signed)
PATIENT: Andrea Cantrell DOB: [birth year]  REASON FOR VISIT: follow up HISTORY FROM: patient  HISTORY OF PRESENT ILLNESS: Today 11/11/2015: Andrea Cantrell is a 65 year old female with a history of relapsed remitting multiple sclerosis. She returns today for follow-up. She is currently on Tecfidera and tolerates it well. She reports that she continues to have numbness specifically in the left arm. She reports today that she is having pain in the left leg. She reports that she went to the emergency room and they diagnosed her with left-sided sciatica. In the past she's had an MRI of the lumbar spine that did show stenosis as well as nerve conduction studies with EMG that showed a right lumbar radiculopathy. The patient states that the pain starts in her left buttocks and travels down the leg. She reports that she has a hard time standing and sitting due to the pain. She did try a short course of steroids in the emergency room with no benefit. She is on gabapentin and tramadol with no benefit. She reports mild swelling in the left lower extremity. Denies any change in temperature in that leg. She reports no change in bowels or bladder. No significant change with her vision. She reports that she is also having some left shoulder pain. An x-ray did show a bone spur. She has a follow-up with her orthopedist this Thursday. She returns today for an evaluation.  HISTORY (YAN): Andrea Cantrell, is a 65 year old female returns for followup of relapsing remitting multiple sclerosis  She has a history of multiple sclerosis since 1990s, also with past medical history of hypertension, hyperlipidemia, fibromyalgia, and discoid lupus in the 1990s. She rarely has a flareup from her lupus   She has episodes of gait difficulty, in 2006, took about 6 months to recover. From then on, she kept on having episodes of worsening gait difficulty, blurry vision. Diagnosis was made in 2013, based upon abnormal MRI.  MRI scan  of the brain shows multiple periventricular lesions solitary enhancing left frontal subcortical lesion with multiple nonenhancing periventricular and subcortical hypointense lesions consistent with myelinating disease.   MRI of the cervical spine with mild degenerative changes but no enhancing lesions are noted.   She was started ontecfidera in October 2013and has tolerated the medication extremely well. Initially she thought she developed a rash but apparently that was due to a GI virus and that has disappeared.   UPDATE June 10th 2015:  She still complains of low back, lower extremity pain, muscle spasm at her toes, she went to hospital in Jul 05 2013, complains of chest tightness, chest pain, near syncope episode, EKG was normal, chest x-ray was normal, laboratory showed normal CBC, CMP with exception of mild elevated alkaline phosphate 140, she was diagnosed with dehydration, which has improved with IV fluid, and pain medications, normal nuclear stress test in October 2015  She now complains of left neck pain, bilateral lower extremity pain  UPDATE Feb 8th 2016: She complains of right knee pain, right foot numbness since Mar 18 2014, going up her right leg, when she put pressure on her right foot, she noticed right toe pain, going up her right leg, She also complains of low back pain, going down her left hip and left leg, she also has right arm intermittent sharp pain.  She went to ED in Dec 27th 2015 for worsening left side low back pain, shooting pain to left leg.  UPDATE June 2nd 2016: We have reviewed MRI lumbar in  February 2016, severe facet arthrosis at L4-5 with new grade 1 anterolisthesis and severe spinal stenosis. Moderate multifactorial spinal stenosis and moderate left neural foraminal stenosis at L3-4.  She has developed severe right shoulder pain in March 2016, hematoma at right forearm, MRI right shoulder March 24th 2016: Complete tear of the supraspinatus tendon  with 3.4 cm of retraction. Complete tear of the intraarticular portion of the long head of the biceps tendon. Mild osteoarthritis of the glenohumeral joint. Moderate degenerative changes of the acromioclavicular joint.  She will have right rotator cuff surgery in July 23 2014 by Dr. Veverly Fells, has stopped aspirin in June first 2016, ibuprofen 5 days prior to that.  She also complains of chronic low back pain, worsening with prolonged walking, or bearing weight, is taking tramadol 50 mg 2 tablets twice a day, which has been effective.  UPDATE Dec 5th 2016: She did have right rotator cuff surgery in June of 2016 by orthopedic surgeon Dr. Veverly Fells, recovered very well, she continue have low back pain, radiating pain to bilateral lower extremity, especially early morning, she denies bowel and bladder incontinence, no significant gait difficulty, but complains of increased low back pain, radiating pain to bilateral lower extremity and lack of stamina We have personally reviewed MRI of lumbar spine in February 2016, multilevel degenerative changes, most severe at L4-5, with moderate to severe spinal canal stenosis  UPDATE 09/11/15 CMMs. Andrea Cantrell is a 65 year old female with a history of relapsed remitting multiple sclerosis. She was last seen in the office by [facility] 07/23/2015. She continues on Tecfidera and tolerates it well. She denies any new weakness. She is complaining of more left arm numbness and tingling .She denies any changes with the bowels or bladder. Denies any significant changes with her gait or balance. She denies any recent falls  She does report some blurred vision however after her recent eye exam  glasses were recommended  She does complain of some neck stiffness that radiates to the right shoulder. She is on baclofen which helps with this  She does have some low back pain that has been ongoing. She does not feel that her pain has gotten worse. She states that her pain is usually dependent on her  activity level for the day. She takes tramadol  to control her discomfort.  She returns today for an evaluation.  REVIEW OF SYSTEMS: Out of a complete 14 system review of symptoms, the patient complains only of the following symptoms, and all other reviewed systems are negative.  Joint pain, joint swelling, back pain, aching muscles, muscle cramps, walking difficulty, frequent waking, daytime sleepiness, double vision, ringing in ears, heat intolerance, bruise or bleed easily  ALLERGIES: No Known Allergies  HOME MEDICATIONS: Outpatient Medications Prior to Visit  Medication Sig Dispense Refill  . albuterol (PROVENTIL HFA;VENTOLIN HFA) 108 (90 BASE) MCG/ACT inhaler Inhale 2 puffs into the lungs every 6 (six) hours as needed for wheezing or shortness of breath (wheezing and shortness of breath). For shortness of breath.    Marland Kitchen amLODipine (NORVASC) 5 MG tablet Take 5 mg by mouth every morning.     Marland Kitchen aspirin EC 81 MG tablet Take 162 mg by mouth every evening.     Marland Kitchen atorvastatin (LIPITOR) 10 MG tablet Take 10 mg by mouth every morning.     . baclofen (LIORESAL) 10 MG tablet Take 1 tablet (10 mg total) by mouth 3 (three) times daily. 270 tablet 3  . DULoxetine (CYMBALTA) 60 MG capsule Take 1 capsule (60  mg total) by mouth daily. 30 capsule 11  . gabapentin (NEURONTIN) 300 MG capsule Take 2 capsules (600 mg total) by mouth 4 (four) times daily. 240 capsule 6  . ibuprofen (ADVIL,MOTRIN) 200 MG tablet Take 800 mg by mouth every 6 (six) hours as needed for moderate pain (right arm pain).     Marland Kitchen ibuprofen (ADVIL,MOTRIN) 800 MG tablet Take 1 tablet (800 mg total) by mouth 3 (three) times daily. 21 tablet 0  . predniSONE (DELTASONE) 10 MG tablet Take 2 tablets (20 mg total) by mouth 2 (two) times daily with a meal. (Patient not taking: Reported on 10/15/2015) 20 tablet 0  . Safflower Oil OIL Take 1 capsule by mouth daily.    . TECFIDERA 240 MG CPDR TAKE ONE CAPSULE (240 MG) BY MOUTH TWO TIMES DAILY . STORE AT  ROOM TEMP IN THE ORIGINAL CONTAINER. DO NOT CRUSH OR CHEW. SWALLOW WHOLE. 180 capsule 3  . traMADol (ULTRAM) 50 MG tablet Take 1 tablet (50 mg total) by mouth every 8 (eight) hours as needed. (Patient taking differently: Take 50-100 mg by mouth every 8 (eight) hours as needed for moderate pain or severe pain. ) 90 tablet 5  . TURMERIC PO Take 1 tablet by mouth every evening.    Marland Kitchen VITAMIN D, CHOLECALCIFEROL, PO Take 3,000 Units by mouth every evening.      No facility-administered medications prior to visit.     PAST MEDICAL HISTORY: Past Medical History:  Diagnosis Date  . Discoid lupus   . Essential hypertension   . Fibromyalgia   . Heavy smoker (more than 20 cigarettes per day)    Was able to stop for 11 years but restarted about 10 years ago  . Hyperlipidemia with target LDL less than 130   . Andrea (multiple sclerosis) ([place])   . Obesity (BMI 35.0-39.9 without comorbidity) ([place])   . Spinal stenosis     PAST SURGICAL HISTORY: Past Surgical History:  Procedure Laterality Date  . ABDOMINAL HYSTERECTOMY  1995  . BREAST SURGERY Left    tissue removal  . [place]  . CHOLECYSTECTOMY  1994  . NM MYOVIEW LTD  October 2015   EF 71%. No ischemia or infarction. Low risk/normal  . ROTATOR CUFF REPAIR Left June 2016    FAMILY HISTORY: Family History  Problem Relation Age of Onset  . Cancer Mother   . Cancer Father   . Diabetes Brother   . Diabetes Maternal Grandmother     SOCIAL HISTORY: Social History   Social History  . Marital status: Legally Separated    Spouse name: N/A  . Number of children: 2  . Years of education: 12+   Occupational History  . Not on file.   Social History Main Topics  . Smoking status: Current Every Day Smoker    Packs/day: 1.00    Types: Cigarettes  . Smokeless tobacco: Never Used  . Alcohol use No  . Drug use: No  . Sexual activity: Not on file   Other Topics Concern  . Not on file   Social History Narrative    Patient lives at home with granddaughter her her 2 children.    Patient is separated. Patient has 2 children.    Patient has some college. Patient is on Disability.    Smokes roughly one pack a day. No alcohol.      PHYSICAL EXAM  Vitals:   11/11/15 1439  BP: 119/69  Pulse: 73  Weight: 194  lb 3.2 oz (88.1 kg)  Height: 5\' 2"  (1.575 m)   Body mass index is 35.52 kg/m.  Generalized: Well developed, in no acute distress   Neurological examination  Mentation: Alert oriented to time, place, history taking. Follows all commands speech and language fluent Cranial nerve II-XII: Pupils were equal round reactive to light. Extraocular movements were full, visual field were full on confrontational test. Facial sensation and strength were normal. Uvula tongue midline. Head turning and shoulder shrug  were normal and symmetric. Motor: The motor testing reveals 5 over 5 strength of all 4 extremities. Good symmetric motor tone is noted throughout.  Lower Extremities: Calf Diameter 15.5 inches bilaterally. Mild edema left lower extremity. No increased temperature in the left lower extremity. Distal pulses are strong bilaterally. Sensory: Sensory testing is intact to soft touch on all 4 extremities. No evidence of extinction is noted.  Coordination: Cerebellar testing reveals good finger-nose-finger and heel-to-shin bilaterally.  Gait and station: Patient has a limping gait on the left. Tandem gait unsteady. Romberg is negative Reflexes: Deep tendon reflexes are symmetric and normal bilaterally.   DIAGNOSTIC DATA (LABS, IMAGING, TESTING) - I reviewed patient records, labs, notes, testing and imaging myself where available.  Lab Results  Component Value Date   WBC 4.7 07/23/2015   HGB 12.5 04/13/2014   HCT 38.9 07/23/2015   MCV 76 (L) 07/23/2015   PLT 424 (H) 07/23/2015      Component Value Date/Time   NA 142 07/23/2015 1408   K 4.5 07/23/2015 1408   CL 103 07/23/2015 1408   CO2 24  07/23/2015 1408   GLUCOSE 82 07/23/2015 1408   GLUCOSE 93 04/13/2014 0804   BUN 7 (L) 07/23/2015 1408   CREATININE 0.64 07/23/2015 1408   CALCIUM 9.3 07/23/2015 1408   PROT 6.9 07/23/2015 1408   ALBUMIN 4.0 07/23/2015 1408   AST 16 07/23/2015 1408   ALT 8 07/23/2015 1408   ALKPHOS 116 07/23/2015 1408   BILITOT 0.4 07/23/2015 1408   GFRNONAA 94 07/23/2015 1408   GFRAA 108 07/23/2015 1408       ASSESSMENT AND PLAN 65 y.o. year old female  has a past medical history of Discoid lupus; Essential hypertension; Fibromyalgia; Heavy smoker (more than 20 cigarettes per day); Hyperlipidemia with target LDL less than 130; Andrea (multiple sclerosis) ([place]); Obesity (BMI 35.0-39.9 without comorbidity) ([place]); and Spinal stenosis. here with:  1. Multiple sclerosis 2. Left leg pain consistent with sciatica  The patient will continue on Tecfidera. She had a repeat of her MRI of the brain in June that was unremarkable. The patient is having left leg pain most consistent with left-sided sciatica. We will repeat nerve conduction studies with EMG on the lower extremities. She will increase gabapentin to 800 mg 4 times a day. Advised that if her symptoms worsen or she develops any new symptoms she she'll let us know. She will follow-up in 3-4 months with Dr. Krista Blue.     Ward Givens, MSN, NP-C 11/11/2015, 2:35 PM [facility] [address] [place], [place] [address] [phone]

## 2015-11-13 NOTE — Progress Notes (Signed)
I have reviewed and agreed above plan. 

## 2015-11-14 ENCOUNTER — Emergency Department (HOSPITAL_COMMUNITY)
Admission: EM | Admit: 2015-11-14 | Discharge: 2015-11-14 | Disposition: A | Discharge status: Home / Self Care | Payer: PPO | Attending: Physician Assistant | Admitting: Physician Assistant

## 2015-11-14 ENCOUNTER — Telehealth: Payer: Self-pay | Admitting: Adult Health

## 2015-11-14 ENCOUNTER — Encounter (HOSPITAL_COMMUNITY): Payer: Self-pay | Admitting: Emergency Medicine

## 2015-11-14 DIAGNOSIS — K047 Periapical abscess without sinus: Secondary | ICD-10-CM | POA: Diagnosis not present

## 2015-11-14 DIAGNOSIS — M75101 Unspecified rotator cuff tear or rupture of right shoulder, not specified as traumatic: Secondary | ICD-10-CM | POA: Diagnosis not present

## 2015-11-14 DIAGNOSIS — Z7982 Long term (current) use of aspirin: Secondary | ICD-10-CM | POA: Insufficient documentation

## 2015-11-14 DIAGNOSIS — K0889 Other specified disorders of teeth and supporting structures: Secondary | ICD-10-CM | POA: Diagnosis not present

## 2015-11-14 DIAGNOSIS — F17210 Nicotine dependence, cigarettes, uncomplicated: Secondary | ICD-10-CM | POA: Insufficient documentation

## 2015-11-14 DIAGNOSIS — I10 Essential (primary) hypertension: Secondary | ICD-10-CM | POA: Diagnosis not present

## 2015-11-14 DIAGNOSIS — M25562 Pain in left knee: Secondary | ICD-10-CM | POA: Diagnosis not present

## 2015-11-14 MED ORDER — BUPIVACAINE-EPINEPHRINE (PF) 0.5% -1:200000 IJ SOLN
1.8000 mL | Freq: Once | INTRAMUSCULAR | Status: DC
  Filled 2015-11-14: qty 1.8

## 2015-11-14 MED ORDER — LIDOCAINE HCL 2 % IJ SOLN
5.0000 mL | Freq: Once | INTRAMUSCULAR | Status: DC
  Filled 2015-11-14: qty 20

## 2015-11-14 MED ORDER — LIDOCAINE VISCOUS 2 % MT SOLN
15.0000 mL | Freq: Once | OROMUCOSAL | Status: AC
Start: 2015-11-14 — End: 2015-11-14
  Administered 2015-11-14: 15 mL via OROMUCOSAL
  Filled 2015-11-14: qty 15

## 2015-11-14 MED ORDER — TRAMADOL HCL 50 MG PO TABS: 50.0000 mg | ORAL_TABLET | Freq: Four times a day (QID) | ORAL | 0 refills | Status: DC | PRN

## 2015-11-14 MED ORDER — PENICILLIN V POTASSIUM 500 MG PO TABS: 500.0000 mg | ORAL_TABLET | Freq: Four times a day (QID) | ORAL | 0 refills | Status: AC

## 2015-11-14 MED ORDER — IBUPROFEN 200 MG PO TABS
600.0000 mg | ORAL_TABLET | Freq: Once | ORAL | Status: AC
  Administered 2015-11-14: 600 mg via ORAL
  Filled 2015-11-14: qty 3

## 2015-11-14 NOTE — Telephone Encounter (Signed)
Pt stated that she will call back to ser-up a pmt and then she can schedule the NCV/EMG.-slb

## 2015-11-14 NOTE — ED Triage Notes (Signed)
Pt reports right lower molar pain x3 days. Swelling to right side of face. Speaking in full sentences.

## 2015-11-14 NOTE — ED Provider Notes (Signed)
[name] Provider Note   CSN: [id number] Arrival date & time: 11/14/15  2121     History   Chief Complaint Chief Complaint  Patient presents with  . Dental Pain    HPI Andrea Cantrell is a 65 y.o. female.  The history is provided by the patient and medical records. No language interpreter was used.  Dental Pain     Andrea Cantrell is a 65 y.o. female  with a PMH of HTN, HLD, MS, discoid lupus, fibromyalgia who presents to the Emergency Department complaining of worsening right lower tooth ache x 3 days. When she awoke this morning, she noticed right sided facial swelling. Ibuprofen taken with little relief in symptoms. No fevers, difficulty breathing, difficulty swallowing.  Past Medical History:  Diagnosis Date  . Discoid lupus   . Essential hypertension   . Fibromyalgia   . Heavy smoker (more than 20 cigarettes per day)    Was able to stop for 11 years but restarted about 10 years ago  . Hyperlipidemia with target LDL less than 130   . MS (multiple sclerosis) ([place])   . Obesity (BMI 35.0-39.9 without comorbidity) ([place])   . Spinal stenosis     Patient Active Problem List   Diagnosis Date Noted  . Lumbar radiculopathy 04/17/2014  . Dizziness 04/13/2014  . Acute bronchitis 04/13/2014  . Hypoxia 04/12/2014  . Left-sided low back pain with left-sided sciatica 03/26/2014  . DOE (dyspnea on exertion) 11/20/2013  . Chest pain with moderate risk for cardiac etiology 11/20/2013  . Essential hypertension   . Hyperlipidemia with target LDL less than 130   . Obesity (BMI 35.0-39.9 without comorbidity) ([place])   . Multiple sclerosis ([place]) 07/26/2012  . Other nonspecific abnormal result of function study of brain and central nervous system 07/26/2012  . Disturbance of skin sensation 07/26/2012    Past Surgical History:  Procedure Laterality Date  . ABDOMINAL HYSTERECTOMY  1995  . BREAST SURGERY Left    tissue removal  . [place]  .  CHOLECYSTECTOMY  1994  . NM MYOVIEW LTD  October 2015   EF 71%. No ischemia or infarction. Low risk/normal  . ROTATOR CUFF REPAIR Left June 2016    OB History    No data available       Home Medications    Prior to Admission medications   Medication Sig Start Date End Date Taking? Authorizing Provider  albuterol (PROVENTIL HFA;VENTOLIN HFA) 108 (90 BASE) MCG/ACT inhaler Inhale 2 puffs into the lungs every 6 (six) hours as needed for wheezing or shortness of breath (wheezing and shortness of breath). For shortness of breath.    Historical Provider, MD  amLODipine (NORVASC) 5 MG tablet Take 5 mg by mouth every morning.     Historical Provider, MD  aspirin EC 81 MG tablet Take 162 mg by mouth every evening.     Historical Provider, MD  atorvastatin (LIPITOR) 10 MG tablet Take 10 mg by mouth every morning.     Historical Provider, MD  baclofen (LIORESAL) 10 MG tablet Take 1 tablet (10 mg total) by mouth 3 (three) times daily. 07/23/15   Kenecia Barren Givens, NP  DULoxetine (CYMBALTA) 60 MG capsule Take 1 capsule (60 mg total) by mouth daily. 07/23/15   Bowyn Mercier Givens, NP  gabapentin (NEURONTIN) 800 MG tablet Take 1 tablet (800 mg total) by mouth 4 (four) times daily. 11/11/15   Buffie Herne Givens, NP  ibuprofen (ADVIL,MOTRIN) 200 MG tablet Take 800  mg by mouth every 6 (six) hours as needed for moderate pain (right arm pain).     Historical Provider, MD  ibuprofen (ADVIL,MOTRIN) 800 MG tablet Take 1 tablet (800 mg total) by mouth 3 (three) times daily. 10/15/15   Samantha Tripp Dowless, PA-C  penicillin v potassium (VEETID) 500 MG tablet Take 1 tablet (500 mg total) by mouth 4 (four) times daily. 11/14/15 11/21/15  Ozella Almond Noeli Lavery, PA-C  Safflower Oil OIL Take 1 capsule by mouth daily.    Historical Provider, MD  TECFIDERA 240 MG CPDR TAKE ONE CAPSULE (240 MG) BY MOUTH TWO TIMES DAILY . STORE AT ROOM TEMP IN THE ORIGINAL CONTAINER. DO NOT CRUSH OR CHEW. SWALLOW WHOLE. 09/03/15   Marcial Pacas, MD  traMADol  (ULTRAM) 50 MG tablet Take 1 tablet (50 mg total) by mouth every 6 (six) hours as needed. 11/14/15   Grayton Lobo Pilcher Avyn Aden, PA-C  TURMERIC PO Take 1 tablet by mouth every evening.    Historical Provider, MD  VITAMIN D, CHOLECALCIFEROL, PO Take 3,000 Units by mouth every evening.     Historical Provider, MD    Family History Family History  Problem Relation Age of Onset  . Cancer Mother   . Cancer Father   . Diabetes Brother   . Diabetes Maternal Grandmother     Social History Social History  Substance Use Topics  . Smoking status: Current Every Day Smoker    Packs/day: 1.00    Types: Cigarettes  . Smokeless tobacco: Never Used  . Alcohol use No     Allergies   Review of patient's allergies indicates no known allergies.   Review of Systems Review of Systems  HENT: Positive for dental problem and facial swelling. Negative for trouble swallowing.   Respiratory: Negative for shortness of breath.      Physical Exam Updated Vital Signs BP 140/80 (BP Location: Left Arm)   Pulse 103   Temp 98.3 F (36.8 C) (Oral)   Resp 18   SpO2 96%   Physical Exam  Constitutional: She is oriented to person, place, and time. She appears well-developed and well-nourished. No distress.  HENT:  Head: Normocephalic and atraumatic.  Mouth/Throat: Uvula is midline and oropharynx is clear and moist. No oropharyngeal exudate, posterior oropharyngeal edema or posterior oropharyngeal erythema.    Neck: Normal range of motion. Neck supple.  No tenderness.   Cardiovascular: Normal rate, regular rhythm and normal heart sounds.  Exam reveals no gallop.   Pulmonary/Chest: Effort normal and breath sounds normal. No respiratory distress. She has no wheezes. She has no rales.  Neurological: She is alert and oriented to person, place, and time.  Skin: Skin is warm and dry.  Psychiatric: She has a normal mood and affect.  Nursing note and vitals reviewed.    ED Treatments / Results  Labs (all labs  ordered are listed, but only abnormal results are displayed) Labs Reviewed - No data to display  EKG  EKG Interpretation None       Radiology No results found.  Procedures Procedures (including critical care time)  INCISION AND DRAINAGE Performed by: Ozella Almond Mort Smelser Consent: Verbal consent obtained. Risks and benefits: risks, benefits and alternatives were discussed Type: dental abscess Body area: Right lower gum Anesthesia: local infiltration Incision was made with a scalpel. Local anesthetic: lidocaine 2%  Anesthetic total: 1.5 ml Complexity: simple Drainage: purulent Drainage amount: moderate Patient tolerance: Patient tolerated the procedure well with no immediate complications.    Medications Ordered in ED  Medications  bupivacaine-epinephrine (MARCAINE W/ EPI) 0.5% -1:200000 injection 1.8 mL (not administered)  lidocaine (XYLOCAINE) 2 % (with pres) injection 100 mg (not administered)  lidocaine (XYLOCAINE) 2 % viscous mouth solution 15 mL (15 mLs Mouth/Throat Given 11/14/15 2149)  ibuprofen (ADVIL,MOTRIN) tablet 600 mg (600 mg Oral Given 11/14/15 2220)     Initial Impression / Assessment and Plan / ED Course  I have reviewed the triage vital signs and the nursing notes.  Pertinent labs & imaging results that were available during my care of the patient were reviewed by me and considered in my medical decision making (see chart for details).  Clinical Course   Patient with dental infection and area of fluctuance c/w abscess. Dental block performed and abscess was drained. He shouldn't tolerated procedure well. Patient is afebrile, non toxic appearing, and swallowing secretions well. Exam not concerning for Ludwig's angina or pharyngeal abscess. Will treat with PenVK. I stressed the importance of dental follow up for ultimate management of dental pain. Patient voices understanding and is agreeable to plan.  Patient discussed with Dr. Thomasene Lot who agrees with  treatment plan.   Final Clinical Impressions(s) / ED Diagnoses   Final diagnoses:  Dental abscess    New Prescriptions Discharge Medication List as of 11/14/2015 10:15 PM    START taking these medications   Details  penicillin v potassium (VEETID) 500 MG tablet Take 1 tablet (500 mg total) by mouth 4 (four) times daily., Starting Thu 11/14/2015, Until Thu 11/21/2015, [place], PA-C 11/14/15 2238    Courteney Julio Alm, MD 11/16/15 2021

## 2015-11-14 NOTE — Discharge Instructions (Signed)
You have a dental injury. It is very important that you get evaluated by a dentist as soon as possible. Call tomorrow to schedule an appointment. Take your full course of antibiotics. Read the instructions below. ° °Eat a soft or liquid diet and rinse your mouth out after meals with warm water. You should see a dentist or return here at once if you have increased swelling, increased pain or uncontrolled bleeding from the site of your injury. ° °SEEK MEDICAL CARE IF:  °You have increased pain not controlled with medicines.  °You have swelling around your tooth, in your face or neck.  °You have bleeding which starts, continues, or gets worse.  °You have a fever >101 °If you are unable to open your mouth °

## 2015-11-19 ENCOUNTER — Ambulatory Visit: Payer: PPO | Admitting: Adult Health

## 2015-11-20 ENCOUNTER — Telehealth: Payer: Self-pay | Admitting: *Deleted

## 2015-11-20 NOTE — Telephone Encounter (Signed)
FORWARD TO DR [facility]  Request for surgical clearance:  1. What type of surgery is being performed? LEFT SHOULDER SCOP,A-SAD, MINI-OPEN RCR, OPEN BICEPS TENODESIS, RCR-MINI-OPEN CHRONIC/OVER 3 MONTHS, BICEPS TENODESIS-OPEN  2. When is this surgery scheduled? TBA  3. Are there any medications that need to be held prior to surgery and how long?  4. Name of physician performing surgery? DR Remo Lipps NORRIS  5. What is your office phone and fax number? PHONE [phone] ,[place]  --[place] [phone]

## 2015-11-22 NOTE — Telephone Encounter (Signed)
FORWARD TO [place]

## 2015-11-22 NOTE — Telephone Encounter (Signed)
The patient will be a low risk patient for low risk study. She is not on any medication but I know of that would need to be held. I would not do any further cardiac evaluation prior to her surgery.  Glenetta Hew, MD

## 2015-12-30 DIAGNOSIS — I10 Essential (primary) hypertension: Secondary | ICD-10-CM | POA: Diagnosis not present

## 2015-12-30 DIAGNOSIS — G35 Multiple sclerosis: Secondary | ICD-10-CM | POA: Diagnosis not present

## 2015-12-30 DIAGNOSIS — E559 Vitamin D deficiency, unspecified: Secondary | ICD-10-CM | POA: Diagnosis not present

## 2015-12-30 DIAGNOSIS — L930 Discoid lupus erythematosus: Secondary | ICD-10-CM | POA: Diagnosis not present

## 2015-12-30 DIAGNOSIS — F325 Major depressive disorder, single episode, in full remission: Secondary | ICD-10-CM | POA: Diagnosis not present

## 2015-12-30 DIAGNOSIS — Z23 Encounter for immunization: Secondary | ICD-10-CM | POA: Diagnosis not present

## 2015-12-30 DIAGNOSIS — E785 Hyperlipidemia, unspecified: Secondary | ICD-10-CM | POA: Diagnosis not present

## 2015-12-30 DIAGNOSIS — M48061 Spinal stenosis, lumbar region without neurogenic claudication: Secondary | ICD-10-CM | POA: Diagnosis not present

## 2015-12-30 DIAGNOSIS — Z800 Family history of malignant neoplasm of digestive organs: Secondary | ICD-10-CM | POA: Diagnosis not present

## 2016-01-22 ENCOUNTER — Ambulatory Visit: Payer: PPO | Admitting: Adult Health

## 2016-02-05 ENCOUNTER — Emergency Department (HOSPITAL_COMMUNITY): Payer: PPO

## 2016-02-05 ENCOUNTER — Encounter (HOSPITAL_COMMUNITY): Payer: Self-pay | Admitting: Emergency Medicine

## 2016-02-05 ENCOUNTER — Emergency Department (HOSPITAL_COMMUNITY)
Admission: EM | Admit: 2016-02-05 | Discharge: 2016-02-05 | Disposition: A | Discharge status: Home / Self Care | Payer: PPO | Attending: Emergency Medicine | Admitting: Emergency Medicine

## 2016-02-05 DIAGNOSIS — R05 Cough: Secondary | ICD-10-CM | POA: Diagnosis not present

## 2016-02-05 DIAGNOSIS — R112 Nausea with vomiting, unspecified: Secondary | ICD-10-CM | POA: Diagnosis not present

## 2016-02-05 DIAGNOSIS — R69 Illness, unspecified: Secondary | ICD-10-CM

## 2016-02-05 DIAGNOSIS — J189 Pneumonia, unspecified organism: Secondary | ICD-10-CM | POA: Diagnosis not present

## 2016-02-05 DIAGNOSIS — J181 Lobar pneumonia, unspecified organism: Secondary | ICD-10-CM | POA: Insufficient documentation

## 2016-02-05 DIAGNOSIS — I10 Essential (primary) hypertension: Secondary | ICD-10-CM | POA: Insufficient documentation

## 2016-02-05 DIAGNOSIS — J111 Influenza due to unidentified influenza virus with other respiratory manifestations: Secondary | ICD-10-CM | POA: Diagnosis not present

## 2016-02-05 DIAGNOSIS — F17210 Nicotine dependence, cigarettes, uncomplicated: Secondary | ICD-10-CM | POA: Diagnosis not present

## 2016-02-05 DIAGNOSIS — R42 Dizziness and giddiness: Secondary | ICD-10-CM | POA: Diagnosis not present

## 2016-02-05 DIAGNOSIS — R404 Transient alteration of awareness: Secondary | ICD-10-CM | POA: Diagnosis not present

## 2016-02-05 DIAGNOSIS — Z7982 Long term (current) use of aspirin: Secondary | ICD-10-CM | POA: Insufficient documentation

## 2016-02-05 LAB — COMPREHENSIVE METABOLIC PANEL
ALBUMIN: 3.9 g/dL (ref 3.5–5.0)
ALK PHOS: 109 U/L (ref 38–126)
ALT: 15 U/L (ref 14–54)
AST: 20 U/L (ref 15–41)
Anion gap: 7 (ref 5–15)
BILIRUBIN TOTAL: 0.9 mg/dL (ref 0.3–1.2)
BUN: 14 mg/dL (ref 6–20)
CALCIUM: 8.7 mg/dL — AB (ref 8.9–10.3)
CO2: 27 mmol/L (ref 22–32)
CREATININE: 0.44 mg/dL (ref 0.44–1.00)
Chloride: 104 mmol/L (ref 101–111)
GFR calc Af Amer: >60 mL/min (ref >60)
GFR calc non Af Amer: >60 mL/min (ref >60)
GLUCOSE: 99 mg/dL (ref 65–99)
Potassium: 4.4 mmol/L (ref 3.5–5.1)
SODIUM: 138 mmol/L (ref 135–145)
TOTAL PROTEIN: 7.4 g/dL (ref 6.5–8.1)

## 2016-02-05 LAB — CBC
HCT: 38.4 % (ref 36.0–46.0)
HEMOGLOBIN: 13.2 g/dL (ref 12.0–15.0)
MCH: 26.3 pg (ref 26.0–34.0)
MCHC: 34.4 g/dL (ref 30.0–36.0)
MCV: 76.6 fL — ABNORMAL LOW (ref 78.0–100.0)
Platelets: 458 10*3/uL — ABNORMAL HIGH (ref 150–400)
RBC: 5.01 MIL/uL (ref 3.87–5.11)
RDW: 16.3 % — AB (ref 11.5–15.5)
WBC: 11.7 10*3/uL — ABNORMAL HIGH (ref 4.0–10.5)

## 2016-02-05 LAB — URINALYSIS, ROUTINE W REFLEX MICROSCOPIC
BILIRUBIN URINE: NEGATIVE
Glucose, UA: NEGATIVE mg/dL
HGB URINE DIPSTICK: NEGATIVE
KETONES UR: 20 mg/dL — AB
Leukocytes, UA: NEGATIVE
NITRITE: NEGATIVE
PH: 5 (ref 5.0–8.0)
Protein, ur: NEGATIVE mg/dL
SPECIFIC GRAVITY, URINE: 1.017 (ref 1.005–1.030)

## 2016-02-05 LAB — DIFFERENTIAL
BASOS PCT: 0 %
Basophils Absolute: 0 10*3/uL (ref 0.0–0.1)
EOS ABS: 0.1 10*3/uL (ref 0.0–0.7)
EOS PCT: 0 %
LYMPHS ABS: 0.3 10*3/uL — AB (ref 0.7–4.0)
Lymphocytes Relative: 3 %
MONOS PCT: 2 %
Monocytes Absolute: 0.3 10*3/uL (ref 0.1–1.0)
NEUTROS PCT: 95 %
Neutro Abs: 11.1 10*3/uL — ABNORMAL HIGH (ref 1.7–7.7)

## 2016-02-05 LAB — LIPASE, BLOOD: Lipase: 36 U/L (ref 11–51)

## 2016-02-05 MED ORDER — BENZONATATE 100 MG PO CAPS: 100.0000 mg | ORAL_CAPSULE | Freq: Three times a day (TID) | ORAL | 0 refills | Status: DC

## 2016-02-05 MED ORDER — AZITHROMYCIN 250 MG PO TABS
500.0000 mg | ORAL_TABLET | Freq: Once | ORAL | Status: AC
  Administered 2016-02-05: 500 mg via ORAL
  Filled 2016-02-05: qty 2

## 2016-02-05 MED ORDER — SODIUM CHLORIDE 0.9 % IV SOLN
Freq: Once | INTRAVENOUS | Status: AC
  Administered 2016-02-05: 12:00:00 via INTRAVENOUS

## 2016-02-05 MED ORDER — SODIUM CHLORIDE 0.9 % IV BOLUS (SEPSIS)
1000.0000 mL | Freq: Once | INTRAVENOUS | Status: AC
Start: 2016-02-05 — End: 2016-02-05
  Administered 2016-02-05: 1000 mL via INTRAVENOUS

## 2016-02-05 MED ORDER — AZITHROMYCIN 250 MG PO TABS: 250.0000 mg | ORAL_TABLET | Freq: Every day | ORAL | 0 refills | Status: DC

## 2016-02-05 MED ORDER — ONDANSETRON 4 MG PO TBDP: 4.0000 mg | ORAL_TABLET | Freq: Three times a day (TID) | ORAL | 0 refills | Status: DC | PRN

## 2016-02-05 MED ORDER — ACETAMINOPHEN 325 MG PO TABS
650.0000 mg | ORAL_TABLET | Freq: Once | ORAL | Status: AC
  Administered 2016-02-05: 650 mg via ORAL
  Filled 2016-02-05: qty 2

## 2016-02-05 MED ORDER — KETOROLAC TROMETHAMINE 30 MG/ML IJ SOLN
15.0000 mg | Freq: Once | INTRAMUSCULAR | Status: AC
  Administered 2016-02-05: 15 mg via INTRAVENOUS
  Filled 2016-02-05: qty 1

## 2016-02-05 NOTE — Discharge Instructions (Signed)
There is evidence of pneumonia on the chest x-ray. You have been given your first dose of antibiotic today. Take your next dose tomorrow morning. Please take all of your antibiotics until finished!   You may develop abdominal discomfort or diarrhea from the antibiotic.  You may help offset this with probiotics which you can buy or get in yogurt. Do not eat or take the probiotics until 2 hours after your antibiotic.   Your other symptoms are consistent with a viral illness. Treatment is symptomatic care and it is important to note that these symptoms may last for 7-10 days. Drink plenty of fluids and get plenty of rest. You should be drinking at least a one half to one liter of water an hour to stay hydrated. Ibuprofen, Naproxen, or Tylenol for pain or fever. Zofran for nausea. Tessalon for cough. Plain Mucinex may help relieve congestion. Warm liquids or Chloraseptic spray may help soothe a sore throat. Follow up with a primary care provider, as needed, for any future management of this issue.  It is recommended that you have a repeat chest xray between January 10 and 17 to assure that the area of infection has resolved.

## 2016-02-05 NOTE — ED Notes (Signed)
Unable to obtain signature on discharge papers due to signature pad in room not working.  Patient verbalized understanding of d/c papers and prescriptions.

## 2016-02-05 NOTE — ED Notes (Signed)
Verbal per [place], PA 650mg  tylenol.

## 2016-02-05 NOTE — ED Notes (Signed)
Bed: [id number] Expected date:  Expected time:  Means of arrival:  Comments: EMS- [phone] F, n/v/d

## 2016-02-05 NOTE — ED Notes (Signed)
Patient d/c'd self care.  F/U and medications reviewed.  Patient verbalized understanding. 

## 2016-02-05 NOTE — ED Provider Notes (Signed)
[facility] DEPT Provider Note   CSN: [id number] Arrival date & time: 02/05/16  1059     History   Chief Complaint Chief Complaint  Patient presents with  . flu like symptoms    HPI Andrea Cantrell is a 65 y.o. female.  HPI    Andrea Cantrell is a 65 y.o. female, with a history of HTN, MS, Lupus, and fibromyalgia, presenting to the ED with nausea, vomiting, and diarrhea beginning this morning around 6 am. Endorses about 6 episodes of emesis. Accompanied by subjective fever, chills, and body aches. Productive cough with yellow and green sputum for last 2 weeks. Received 4 mg zofran via EMS which helped and patient hasn't vomited since administration. Denies abdominal pain, shortness of breath, chest pain, urinary complaints, or any other complaints.   Past Medical History:  Diagnosis Date  . Discoid lupus   . Essential hypertension   . Fibromyalgia   . Heavy smoker (more than 20 cigarettes per day)    Was able to stop for 11 years but restarted about 10 years ago  . Hyperlipidemia with target LDL less than 130   . MS (multiple sclerosis) ([place])   . Obesity (BMI 35.0-39.9 without comorbidity)   . Spinal stenosis     Patient Active Problem List   Diagnosis Date Noted  . Lumbar radiculopathy 04/17/2014  . Dizziness 04/13/2014  . Acute bronchitis 04/13/2014  . Hypoxia 04/12/2014  . Left-sided low back pain with left-sided sciatica 03/26/2014  . DOE (dyspnea on exertion) 11/20/2013  . Chest pain with moderate risk for cardiac etiology 11/20/2013  . Essential hypertension   . Hyperlipidemia with target LDL less than 130   . Obesity (BMI 35.0-39.9 without comorbidity) ([place])   . Multiple sclerosis ([place]) 07/26/2012  . Other nonspecific abnormal result of function study of brain and central nervous system 07/26/2012  . Disturbance of skin sensation 07/26/2012    Past Surgical History:  Procedure Laterality Date  . ABDOMINAL HYSTERECTOMY  1995  . BREAST SURGERY Left     tissue removal  . [place]  . CHOLECYSTECTOMY  1994  . NM MYOVIEW LTD  October 2015   EF 71%. No ischemia or infarction. Low risk/normal  . ROTATOR CUFF REPAIR Left June 2016    OB History    No data available       Home Medications    Prior to Admission medications   Medication Sig Start Date End Date Taking? Authorizing Provider  albuterol (PROVENTIL HFA;VENTOLIN HFA) 108 (90 BASE) MCG/ACT inhaler Inhale 2 puffs into the lungs every 6 (six) hours as needed for wheezing or shortness of breath (wheezing and shortness of breath). For shortness of breath.   Yes Historical Provider, MD  amLODipine (NORVASC) 5 MG tablet Take 5 mg by mouth every morning.    Yes Historical Provider, MD  aspirin EC 81 MG tablet Take 162 mg by mouth every evening.    Yes Historical Provider, MD  atorvastatin (LIPITOR) 10 MG tablet Take 10 mg by mouth every morning.    Yes Historical Provider, MD  baclofen (LIORESAL) 10 MG tablet Take 1 tablet (10 mg total) by mouth 3 (three) times daily. 07/23/15  Yes Ward Givens, NP  DULoxetine (CYMBALTA) 60 MG capsule Take 1 capsule (60 mg total) by mouth daily. 07/23/15  Yes Ward Givens, NP  gabapentin (NEURONTIN) 300 MG capsule Take 600 mg by mouth 3 (three) times daily. 01/20/16  Yes Historical Provider, MD  Melchor Amour  240 MG CPDR TAKE ONE CAPSULE (240 MG) BY MOUTH TWO TIMES DAILY . STORE AT ROOM TEMP IN THE ORIGINAL CONTAINER. DO NOT CRUSH OR CHEW. SWALLOW WHOLE. 09/03/15  Yes Marcial Pacas, MD  traMADol (ULTRAM) 50 MG tablet Take 1 tablet (50 mg total) by mouth every 6 (six) hours as needed. 11/14/15  Yes Jaime Pilcher Ward, PA-C  VITAMIN D, CHOLECALCIFEROL, PO Take 10,000 Units by mouth every evening.    Yes Historical Provider, MD  azithromycin (ZITHROMAX Z-PAK) 250 MG tablet Take 1 tablet (250 mg total) by mouth daily. 02/06/16   Betul Brisky C Jamas [place], PA-C  benzonatate (TESSALON) 100 MG capsule Take 1 capsule (100 mg total) by mouth every 8 (eight) hours.  02/05/16   Cantrell Martus C Crescencio Jozwiak, PA-C  ibuprofen (ADVIL,MOTRIN) 800 MG tablet Take 1 tablet (800 mg total) by mouth 3 (three) times daily. Patient not taking: Reported on 02/05/2016 10/15/15   Aldona Bar Tripp Dowless, PA-C  ondansetron (ZOFRAN ODT) 4 MG disintegrating tablet Take 1 tablet (4 mg total) by mouth every 8 (eight) hours as needed for nausea or vomiting. 02/05/16   Lorayne Bender, PA-C    Family History Family History  Problem Relation Age of Onset  . Cancer Mother   . Cancer Father   . Diabetes Brother   . Diabetes Maternal Grandmother     Social History Social History  Substance Use Topics  . Smoking status: Current Every Day Smoker    Packs/day: 1.00    Types: Cigarettes  . Smokeless tobacco: Never Used  . Alcohol use No     Allergies   Patient has no known allergies.   Review of Systems Review of Systems  Constitutional: Positive for chills and fever.  Respiratory: Positive for cough. Negative for shortness of breath.   Cardiovascular: Negative for chest pain.  Gastrointestinal: Positive for diarrhea, nausea and vomiting. Negative for abdominal pain.  All other systems reviewed and are negative.    Physical Exam Updated Vital Signs BP 137/58 (BP Location: Left Arm)   Pulse 95   Temp 98.9 F (37.2 C) (Oral)   Resp 16   SpO2 94%   Physical Exam  Constitutional: She appears well-developed and well-nourished. No distress.  HENT:  Head: Normocephalic and atraumatic.  Eyes: Conjunctivae are normal.  Neck: Neck supple.  Cardiovascular: Normal rate, regular rhythm, normal heart sounds and intact distal pulses.   Pulmonary/Chest: Effort normal and breath sounds normal. No respiratory distress.  Abdominal: Soft. There is no tenderness. There is no guarding.  Musculoskeletal: She exhibits no edema.  Lymphadenopathy:    She has no cervical adenopathy.  Neurological: She is alert.  Skin: Skin is warm and dry. She is not diaphoretic.  Psychiatric: She has a normal  mood and affect. Her behavior is normal.  Nursing note and vitals reviewed.    ED Treatments / Results  Labs (all labs ordered are listed, but only abnormal results are displayed) Labs Reviewed  COMPREHENSIVE METABOLIC PANEL - Abnormal; Notable for the following:       Result Value   Calcium 8.7 (*)    All other components within normal limits  CBC - Abnormal; Notable for the following:    WBC 11.7 (*)    MCV 76.6 (*)    RDW 16.3 (*)    Platelets 458 (*)    All other components within normal limits  URINALYSIS, ROUTINE W REFLEX MICROSCOPIC - Abnormal; Notable for the following:    Ketones, ur 20 (*)  All other components within normal limits  DIFFERENTIAL - Abnormal; Notable for the following:    Neutro Abs 11.1 (*)    Lymphs Abs 0.3 (*)    All other components within normal limits  LIPASE, BLOOD    EKG  EKG Interpretation None       Radiology Dg Chest 2 View  Result Date: 02/05/2016 CLINICAL DATA:  Two weeks of productive cough and flu like symptoms with onset of GI symptoms this morning. Current smoker. History of MS, hypertension. EXAM: CHEST  2 VIEW COMPARISON:  Chest x-ray of Jul 05, 2013 FINDINGS: The lungs are adequately inflated. There is increased density in the left retrocardiac region likely in the anterior aspect of the lower lobe. The heart and pulmonary vascularity are normal. The mediastinum is normal in width. There is calcification in the wall of the aortic arch. There is no pleural effusion. The bony thorax is unremarkable. IMPRESSION: Left lower lobe subsegmental atelectasis or early pneumonia. Followup PA and lateral chest X-ray is recommended in 3-4 weeks following trial of antibiotic therapy to ensure resolution and exclude underlying malignancy. Thoracic aortic atherosclerosis. Electronically Signed   By: David  Martinique M.D.   On: 02/05/2016 13:11    Procedures Procedures (including critical care time)  Medications Ordered in ED Medications    sodium chloride 0.9 % bolus 1,000 mL (0 mLs Intravenous Stopped 02/05/16 1339)    Followed by  0.9 %  sodium chloride infusion ( Intravenous Stopped 02/05/16 1311)  ketorolac (TORADOL) 30 MG/ML injection 15 mg (15 mg Intravenous Given 02/05/16 1159)  azithromycin (ZITHROMAX) tablet 500 mg (500 mg Oral Given 02/05/16 1533)  acetaminophen (TYLENOL) tablet 650 mg (650 mg Oral Given 02/05/16 1544)     Initial Impression / Assessment and Plan / ED Course  I have reviewed the triage vital signs and the nursing notes.  Pertinent labs & imaging results that were available during my care of the patient were reviewed by me and considered in my medical decision making (see chart for details).  Clinical Course     Patient presents with productive cough as well as influenza-like symptoms. Patient is nontoxic appearing, afebrile, not tachycardic, not tachypneic, not hypotensive, maintains adequate SPO2 on room air, and is in no apparent distress. Low suspicion for sepsis. PCP follow-up. Home care and return precautions discussed. Patient voiced understanding of all instructions and is comfortable with discharge.    Vitals:   02/05/16 1154 02/05/16 1536  BP: 137/58 115/71  Pulse: 95 91  Resp: 16 17  Temp: 98.9 F (37.2 C) 100.3 F (37.9 C)  TempSrc: Oral Oral  SpO2: 94% 91%     Findings and plan of care discussed with Courtney Paris, MD. Dr. Sherry Ruffing personally evaluated and examined this patient.  Final Clinical Impressions(s) / ED Diagnoses   Final diagnoses:  Community acquired pneumonia of left lower lobe of lung ([place])  Influenza-like illness    New Prescriptions Discharge Medication List as of 02/05/2016  3:18 PM    START taking these medications   Details  azithromycin (ZITHROMAX Z-PAK) 250 MG tablet Take 1 tablet (250 mg total) by mouth daily., Starting Thu 02/06/2016, Print    benzonatate (TESSALON) 100 MG capsule Take 1 capsule (100 mg total) by mouth every 8  (eight) hours., Starting Wed 02/05/2016, Print    ondansetron (ZOFRAN ODT) 4 MG disintegrating tablet Take 1 tablet (4 mg total) by mouth every 8 (eight) hours as needed for nausea or vomiting., Starting Wed 02/05/2016, Print  Lorayne Bender, PA-C 02/05/16 2054    Gwenyth Allegra Tegeler, MD 02/06/16 [phone]

## 2016-02-05 NOTE — ED Triage Notes (Signed)
Per pt, states flu like symptoms-N/V/D-states URI for two weeks-GI symptoms started this am-CBG 135-NSR-4 mg of IV Zofran given in route-patient stated med helped

## 2016-02-05 NOTE — ED Notes (Signed)
Made patient aware that we needed a urine sample.  She was unable to  Go at this time

## 2016-02-19 DIAGNOSIS — I700 Atherosclerosis of aorta: Secondary | ICD-10-CM | POA: Diagnosis not present

## 2016-02-19 DIAGNOSIS — Z23 Encounter for immunization: Secondary | ICD-10-CM | POA: Diagnosis not present

## 2016-02-19 DIAGNOSIS — F17200 Nicotine dependence, unspecified, uncomplicated: Secondary | ICD-10-CM | POA: Diagnosis not present

## 2016-02-19 DIAGNOSIS — J181 Lobar pneumonia, unspecified organism: Secondary | ICD-10-CM | POA: Diagnosis not present

## 2016-02-22 ENCOUNTER — Other Ambulatory Visit: Payer: Self-pay | Admitting: Adult Health

## 2016-02-25 NOTE — Telephone Encounter (Signed)
Pt called says she is out of medication. Please refill

## 2016-02-26 NOTE — Telephone Encounter (Signed)
Patient reported to me at the last visit that tramadol was not helping. Please call and clarify what she is using it for.

## 2016-02-27 NOTE — Telephone Encounter (Signed)
LVM requesting call back re: if she is taking Tramadol. If so, what is she taking it for. Left number for call back.

## 2016-03-03 NOTE — Telephone Encounter (Signed)
Received another fax for this medication.  Did youj want to refill.  Her last note she was having some L leg sciatica.  Dr. Krista Blue has prescribed in past.

## 2016-03-03 NOTE — Telephone Encounter (Signed)
In sept she was having left sciatic pain. I increased gabapentin and ordered ncs/emg.  She has not completed the ncs/emg. Please call and verify what she is using the tramadol for. If she has continued to have left leg pain she should be scheduled for ncs/emg.

## 2016-03-06 NOTE — Telephone Encounter (Addendum)
I spoke to pt again.  She has been taking gabapentin600mg  po TID, she did not go up on this.  Your ofv note states 800mg  po QID.  (I told her that the gabapentin works for nerve pain and would be for the L sciatica leg nerve pain).  She can take 900mg  po qid (3 caps of the 300mg  qid?).  She stated that she was taking tramadol for generalized pain/ spinal stenosis prn and this helped the L leg pain.  She will try the gabapentin increase and see if this helps. She is  on her way to funeral and be back Sunday.  Hold off on refill of tramadol?

## 2016-03-06 NOTE — Telephone Encounter (Signed)
Spoke to pt and relayed that she is to try 3 caps (300mg  caps ) po TID.  This will be 900mg  po TID.  Pt verbalized understanding and will hold off on the tramadol at this time.   She will let us know if this works.

## 2016-03-06 NOTE — Telephone Encounter (Signed)
I would advise that she increase to 900 mg ( 3 tabs of the 300 mg tablet) TID. Increasing to QID might be too much at one time. For now we will hold off on Tramadol.

## 2016-03-13 ENCOUNTER — Telehealth: Payer: Self-pay | Admitting: Adult Health

## 2016-03-13 MED ORDER — TRAMADOL HCL 50 MG PO TABS: 50.0000 mg | ORAL_TABLET | Freq: Four times a day (QID) | ORAL | 0 refills | Status: DC | PRN

## 2016-03-13 NOTE — Telephone Encounter (Signed)
Tramadol refilled. Please set patient up for appointment

## 2016-03-13 NOTE — Telephone Encounter (Signed)
Fax confirmation received [place] [place] [phone].  Tramadol.  Made RV appt increasing pain/ MS  For 04-09-16 at 0730.  Pt verbalized understanding.

## 2016-03-13 NOTE — Addendum Note (Signed)
Addended by: Trudie Buckler on: 03/13/2016 11:31 AM   Modules accepted: Orders

## 2016-03-13 NOTE — Telephone Encounter (Signed)
Patient calling in reference to tramadol.  Patient states she has been off of it for 2 weeks and is in extreme pain.  Patient also said her regular doctor will not prescribe due to our office prescribing it.  Gabapentin is not making a difference with the nerve pain.  Please call

## 2016-03-19 ENCOUNTER — Other Ambulatory Visit: Payer: Self-pay | Admitting: *Deleted

## 2016-03-19 ENCOUNTER — Other Ambulatory Visit: Payer: Self-pay | Admitting: Family Medicine

## 2016-03-19 ENCOUNTER — Ambulatory Visit
Admission: RE | Admit: 2016-03-19 | Discharge: 2016-03-19 | Disposition: A | Discharge status: Home / Self Care | Payer: PPO | Source: Ambulatory Visit | Attending: Family Medicine | Admitting: Family Medicine

## 2016-03-19 ENCOUNTER — Telehealth: Payer: Self-pay | Admitting: Adult Health

## 2016-03-19 DIAGNOSIS — J189 Pneumonia, unspecified organism: Secondary | ICD-10-CM | POA: Diagnosis not present

## 2016-03-19 DIAGNOSIS — J181 Lobar pneumonia, unspecified organism: Secondary | ICD-10-CM

## 2016-03-19 MED ORDER — TRAMADOL HCL 50 MG PO TABS: ORAL_TABLET | ORAL | 0 refills | Status: DC

## 2016-03-19 NOTE — Telephone Encounter (Signed)
Left message for a return call

## 2016-03-19 NOTE — Telephone Encounter (Signed)
Patient has a pending appt on 04/09/16.  Per Dr. Felecia Shelling, he will provide a Tramadol prescription to allow for 3 tablets daily to last until her appt.  Pt agreeable.  Rx printed, signed, faxed and confirmed to pharmacy.

## 2016-03-19 NOTE — Telephone Encounter (Signed)
Patient requesting refill of tramadol. She has an apt on 2/22 but not enough med until then. Best call back [phone]

## 2016-03-19 NOTE — Telephone Encounter (Signed)
Patient returning nurse's call. Please call

## 2016-03-24 DIAGNOSIS — M25562 Pain in left knee: Secondary | ICD-10-CM | POA: Diagnosis not present

## 2016-03-31 DIAGNOSIS — I10 Essential (primary) hypertension: Secondary | ICD-10-CM | POA: Diagnosis not present

## 2016-03-31 DIAGNOSIS — E559 Vitamin D deficiency, unspecified: Secondary | ICD-10-CM | POA: Diagnosis not present

## 2016-03-31 DIAGNOSIS — Z1159 Encounter for screening for other viral diseases: Secondary | ICD-10-CM | POA: Diagnosis not present

## 2016-03-31 DIAGNOSIS — F325 Major depressive disorder, single episode, in full remission: Secondary | ICD-10-CM | POA: Diagnosis not present

## 2016-03-31 DIAGNOSIS — F17200 Nicotine dependence, unspecified, uncomplicated: Secondary | ICD-10-CM | POA: Diagnosis not present

## 2016-03-31 DIAGNOSIS — L930 Discoid lupus erythematosus: Secondary | ICD-10-CM | POA: Diagnosis not present

## 2016-03-31 DIAGNOSIS — Z206 Contact with and (suspected) exposure to human immunodeficiency virus [HIV]: Secondary | ICD-10-CM | POA: Diagnosis not present

## 2016-03-31 DIAGNOSIS — E785 Hyperlipidemia, unspecified: Secondary | ICD-10-CM | POA: Diagnosis not present

## 2016-04-09 ENCOUNTER — Encounter: Payer: Self-pay | Admitting: Adult Health

## 2016-04-09 ENCOUNTER — Ambulatory Visit (INDEPENDENT_AMBULATORY_CARE_PROVIDER_SITE_OTHER): Payer: PPO | Admitting: Adult Health

## 2016-04-09 VITALS — BP 115/80 | HR 78 | Ht 62.0 in | Wt 196.0 lb

## 2016-04-09 DIAGNOSIS — G35 Multiple sclerosis: Secondary | ICD-10-CM

## 2016-04-09 DIAGNOSIS — M5442 Lumbago with sciatica, left side: Secondary | ICD-10-CM

## 2016-04-09 DIAGNOSIS — Z5181 Encounter for therapeutic drug level monitoring: Secondary | ICD-10-CM

## 2016-04-09 DIAGNOSIS — G8929 Other chronic pain: Secondary | ICD-10-CM | POA: Diagnosis not present

## 2016-04-09 MED ORDER — BACLOFEN 10 MG PO TABS: 10.0000 mg | ORAL_TABLET | Freq: Three times a day (TID) | ORAL | 3 refills | Status: DC

## 2016-04-09 MED ORDER — DIMETHYL FUMARATE 240 MG PO CPDR: DELAYED_RELEASE_CAPSULE | ORAL | 3 refills | Status: DC

## 2016-04-09 MED ORDER — TRAMADOL HCL 50 MG PO TABS: ORAL_TABLET | ORAL | 0 refills | Status: DC

## 2016-04-09 NOTE — Patient Instructions (Signed)
Continue Gabapentin  Continue Tecfidera Repeat NCS/EMG Blood Work today If your symptoms worsen or you develop new symptoms please let us know.

## 2016-04-09 NOTE — Progress Notes (Signed)
Fax confirmation received tramadol [facility] [phone]. sy

## 2016-04-09 NOTE — Progress Notes (Signed)
PATIENT: Andrea Cantrell DOB: [birth year]  REASON FOR VISIT: follow up HISTORY FROM: patient  HISTORY OF PRESENT ILLNESS: HISTORY   HISTORY (YAN): Andrea Cantrell, is a 66 year old female returns for followup of relapsing remitting multiple sclerosis  She has a history of multiple sclerosis since 1990s, also with past medical history of hypertension, hyperlipidemia, fibromyalgia, and discoid lupus in the 1990s. She rarely has a flareup from her lupus   She has episodes of gait difficulty, in 2006, took about 6 months to recover. From then on, she kept on having episodes of worsening gait difficulty, blurry vision. Diagnosis was made in 2013, based upon abnormal MRI.  MRI scan of the brain shows multiple periventricular lesions solitary enhancing left frontal subcortical lesion with multiple nonenhancing periventricular and subcortical hypointense lesions consistent with myelinating disease.   MRI of the cervical spine with mild degenerative changes but no enhancing lesions are noted.   She was started ontecfidera in October 2013and has tolerated the medication extremely well. Initially she thought she developed a rash but apparently that was due to a GI virus and that has disappeared.   UPDATE June 10th 2015:  She still complains of low back, lower extremity pain, muscle spasm at her toes, she went to hospital in Jul 05 2013, complains of chest tightness, chest pain, near syncope episode, EKG was normal, chest x-ray was normal, laboratory showed normal CBC, CMP with exception of mild elevated alkaline phosphate 140, she was diagnosed with dehydration, which has improved with IV fluid, and pain medications, normal nuclear stress test in October 2015  She now complains of left neck pain, bilateral lower extremity pain  UPDATE Feb 8th 2016: She complains of right knee pain, right foot numbness since Mar 18 2014, going up her right leg, when she put pressure on her right  foot, she noticed right toe pain, going up her right leg, She also complains of low back pain, going down her left hip and left leg, she also has right arm intermittent sharp pain.  She went to ED in Dec 27th 2015 for worsening left side low back pain, shooting pain to left leg.  UPDATE June 2nd 2016: We have reviewed MRI lumbar in February 2016, severe facet arthrosis at L4-5 with new grade 1 anterolisthesis and severe spinal stenosis. Moderate multifactorial spinal stenosis and moderate left neural foraminal stenosis at L3-4.  She has developed severe right shoulder pain in March 2016, hematoma at right forearm, MRI right shoulder March 24th 2016: Complete tear of the supraspinatus tendon with 3.4 cm of retraction. Complete tear of the intraarticular portion of the long head of the biceps tendon. Mild osteoarthritis of the glenohumeral joint. Moderate degenerative changes of the acromioclavicular joint.  She will have right rotator cuff surgery in July 23 2014 by Dr. Veverly Fells, has stopped aspirin in June first 2016, ibuprofen 5 days prior to that.  She also complains of chronic low back pain, worsening with prolonged walking, or bearing weight, is taking tramadol 50 mg 2 tablets twice a day, which has been effective.  UPDATE Dec 5th 2016: She did have right rotator cuff surgery in June of 2016 by orthopedic surgeon Dr. Veverly Fells, recovered very well, she continue have low back pain, radiating pain to bilateral lower extremity, especially early morning, she denies bowel and bladder incontinence, no significant gait difficulty, but complains of increased low back pain, radiating pain to bilateral lower extremity and lack of stamina We have personally reviewed MRI of  lumbar spine in February 2016, multilevel degenerative changes, most severe at L4-5, with moderate to severe spinal canal stenosis  UPDATE 09/11/15 CMMs. Andrea Cantrell is a 67 year old female with a history of relapsed remitting multiple  sclerosis.She was last seen in the office by [facility] 07/23/2015.She continues on Tecfidera and tolerates it well. She denies any new weakness. She is complaining of more left arm numbness and tingling .She denies any changes with the bowels or bladder. Denies any significant changes with her gait or balance. She denies any recent falls She does report some blurred vision however after her recent eye exam glasses were recommended She does complain of some neck stiffness that radiates to the right shoulder. She is on baclofen which helps with this She does have some low back pain that has been ongoing. She does not feel that her pain has gotten worse. She states that her pain is usually dependent on her activity level for the day. She takes tramadol to control her discomfort. She returns today for an evaluation.  11/11/2015: Andrea Cantrell is a 66 year old female with a history of relapsed remitting multiple sclerosis. She returns today for follow-up. She is currently on Tecfidera and tolerates it well. She reports that she continues to have numbness specifically in the left arm. She reports today that she is having pain in the left leg. She reports that she went to the emergency room and they diagnosed her with left-sided sciatica. In the past she's had an MRI of the lumbar spine that did show stenosis as well as nerve conduction studies with EMG that showed a right lumbar radiculopathy. The patient states that the pain starts in her left buttocks and travels down the leg. She reports that she has a hard time standing and sitting due to the pain. She did try a short course of steroids in the emergency room with no benefit. She is on gabapentin and tramadol with no benefit. She reports mild swelling in the left lower extremity. Denies any change in temperature in that leg. She reports no change in bowels or bladder. No significant change with her vision. She reports that she is also having some left shoulder  pain. An x-ray did show a bone spur. She has a follow-up with her orthopedist this Thursday. She returns today for an evaluation.  Today 04/09/2016: Andrea Cantrell is a 66 year old female with a history of chronic back pain and multiple sclerosis. She returns today for follow-up. She continues on Tecfidera and is tolerating it well. Denies any new numbness or weakness. Denies any changes in her gait or balance. Denies any changes with her vision. No change with the bowels or bladder. The patient continues to have back pain on the left side that radiates down the leg. This has been ongoing. In the past she has been seen at [facility] pain management and received epidural steroid injections. She reports that these were beneficial. Currently she is using tramadol 100 mg in the morning and 50 mg in the evening. She states that if she did not take the tramadol she would have the difficult time ambulating. She also has knee pain on the left side. She saw her orthopedic who aspirated fluid off the knee. She also has pain in the hips when sleeping at night. In the past she's had a lumbar MRI that did show some spinal stenosis as well as nerve conduction studies with EMG. She returns today for an evaluation.  REVIEW OF SYSTEMS: Out of a complete 14 system review  of symptoms, the patient complains only of the following symptoms, and all other reviewed systems are negative.  Joint pain, joint swelling, back pain, aching muscles, muscle cramps, walking difficulty, neck stiffness, frequent waking, restless leg, double vision, light sensitivity, ringing in ears, numbness, memory loss, bruise/bleed easily  ALLERGIES: No Known Allergies  HOME MEDICATIONS: Outpatient Medications Prior to Visit  Medication Sig Dispense Refill  . albuterol (PROVENTIL HFA;VENTOLIN HFA) 108 (90 BASE) MCG/ACT inhaler Inhale 2 puffs into the lungs every 6 (six) hours as needed for wheezing or shortness of breath (wheezing and shortness of  breath). For shortness of breath.    Marland Kitchen amLODipine (NORVASC) 5 MG tablet Take 5 mg by mouth every morning.     Marland Kitchen aspirin EC 81 MG tablet Take 162 mg by mouth every evening.     Marland Kitchen atorvastatin (LIPITOR) 10 MG tablet Take 10 mg by mouth every morning.     . baclofen (LIORESAL) 10 MG tablet Take 1 tablet (10 mg total) by mouth 3 (three) times daily. 270 tablet 3  . DULoxetine (CYMBALTA) 60 MG capsule Take 1 capsule (60 mg total) by mouth daily. 30 capsule 11  . gabapentin (NEURONTIN) 300 MG capsule Take 300 mg by mouth 3 (three) times daily.     Marland Kitchen ibuprofen (ADVIL,MOTRIN) 800 MG tablet Take 1 tablet (800 mg total) by mouth 3 (three) times daily. 21 tablet 0  . ondansetron (ZOFRAN ODT) 4 MG disintegrating tablet Take 1 tablet (4 mg total) by mouth every 8 (eight) hours as needed for nausea or vomiting. 20 tablet 0  . TECFIDERA 240 MG CPDR TAKE ONE CAPSULE (240 MG) BY MOUTH TWO TIMES DAILY . STORE AT ROOM TEMP IN THE ORIGINAL CONTAINER. DO NOT CRUSH OR CHEW. SWALLOW WHOLE. 180 capsule 3  . traMADol (ULTRAM) 50 MG tablet Take one tab q8h prn.  Max: 3 tabs/day. 63 tablet 0  . VITAMIN D, CHOLECALCIFEROL, PO Take 10,000 Units by mouth every evening.     Marland Kitchen azithromycin (ZITHROMAX Z-PAK) 250 MG tablet Take 1 tablet (250 mg total) by mouth daily. (Patient not taking: Reported on 04/09/2016) 4 tablet 0  . benzonatate (TESSALON) 100 MG capsule Take 1 capsule (100 mg total) by mouth every 8 (eight) hours. (Patient not taking: Reported on 04/09/2016) 21 capsule 0   No facility-administered medications prior to visit.     PAST MEDICAL HISTORY: Past Medical History:  Diagnosis Date  . Discoid lupus   . Essential hypertension   . Fibromyalgia   . Heavy smoker (more than 20 cigarettes per day)    Was able to stop for 11 years but restarted about 10 years ago  . Hyperlipidemia with target LDL less than 130   . Andrea (multiple sclerosis) ([place])   . Obesity (BMI 35.0-39.9 without comorbidity)   . Spinal stenosis      PAST SURGICAL HISTORY: Past Surgical History:  Procedure Laterality Date  . ABDOMINAL HYSTERECTOMY  1995  . BREAST SURGERY Left    tissue removal  . [place]  . CHOLECYSTECTOMY  1994  . NM MYOVIEW LTD  October 2015   EF 71%. No ischemia or infarction. Low risk/normal  . ROTATOR CUFF REPAIR Left June 2016    FAMILY HISTORY: Family History  Problem Relation Age of Onset  . Cancer Mother   . Cancer Father   . Diabetes Brother   . Diabetes Maternal Grandmother     SOCIAL HISTORY: Social History   Social History  .  Marital status: Legally Separated    Spouse name: N/A  . Number of children: 2  . Years of education: 12+   Occupational History  . Not on file.   Social History Main Topics  . Smoking status: Current Every Day Smoker    Packs/day: 1.00    Types: Cigarettes  . Smokeless tobacco: Never Used  . Alcohol use No  . Drug use: No  . Sexual activity: Not on file   Other Topics Concern  . Not on file   Social History Narrative   Patient lives at home with granddaughter her her 2 children.    Patient is separated. Patient has 2 children.    Patient has some college. Patient is on Disability.    Smokes roughly one pack a day. No alcohol.      PHYSICAL EXAM  Vitals:   04/09/16 0716  BP: 115/80  Pulse: 78  Weight: 196 lb (88.9 kg)  Height: 5\' 2"  (1.575 m)   Body mass index is 35.85 kg/m.  Generalized: Well developed, in no acute distress   Neurological examination  Mentation: Alert oriented to time, place, history taking. Follows all commands speech and language fluent Cranial nerve II-XII: Pupils were equal round reactive to light. Extraocular movements were full, visual field were full on confrontational test. Facial sensation and strength were normal. Uvula tongue midline. Head turning and shoulder shrug  were normal and symmetric. Motor: The motor testing reveals 5 over 5 strength of all 4 extremities. Good symmetric  motor tone is noted throughout. Discomfort to palpation on the left lower back and buttocks. Sensory: Sensory testing is intact to soft touch on all 4 extremities. No evidence of extinction is noted.  Coordination: Cerebellar testing reveals good finger-nose-finger and heel-to-shin bilaterally.  Gait and station: Gait is normal. Tandem gait is normal. Romberg is negative. No drift is seen.  Reflexes: Deep tendon reflexes are symmetric and normal bilaterally.   DIAGNOSTIC DATA (LABS, IMAGING, TESTING) - I reviewed patient records, labs, notes, testing and imaging myself where available.  Lab Results  Component Value Date   WBC 11.7 (H) 02/05/2016   HGB 13.2 02/05/2016   HCT 38.4 02/05/2016   MCV 76.6 (L) 02/05/2016   PLT 458 (H) 02/05/2016      Component Value Date/Time   NA 138 02/05/2016 1139   NA 142 07/23/2015 1408   K 4.4 02/05/2016 1139   CL 104 02/05/2016 1139   CO2 27 02/05/2016 1139   GLUCOSE 99 02/05/2016 1139   BUN 14 02/05/2016 1139   BUN 7 (L) 07/23/2015 1408   CREATININE 0.44 02/05/2016 1139   CALCIUM 8.7 (L) 02/05/2016 1139   PROT 7.4 02/05/2016 1139   PROT 6.9 07/23/2015 1408   ALBUMIN 3.9 02/05/2016 1139   ALBUMIN 4.0 07/23/2015 1408   AST 20 02/05/2016 1139   ALT 15 02/05/2016 1139   ALKPHOS 109 02/05/2016 1139   BILITOT 0.9 02/05/2016 1139   BILITOT 0.4 07/23/2015 1408   GFRNONAA >60 02/05/2016 1139   GFRAA >60 02/05/2016 1139    Lab Results  Component Value Date   TSH 0.739 04/13/2014      ASSESSMENT AND PLAN 66 y.o. year old female  has a past medical history of Discoid lupus; Essential hypertension; Fibromyalgia; Heavy smoker (more than 20 cigarettes per day); Hyperlipidemia with target LDL less than 130; Andrea (multiple sclerosis) ([place]); Obesity (BMI 35.0-39.9 without comorbidity); and Spinal stenosis. here with:  1. Multiple sclerosis 2. Chronic back pain  The  patient will continue on Tecfidera. I'll check blood work today. I will refill the  patient's baclofen and tramadol. Patient is advised that tramadol in conjunction with Cymbalta can cause serotonin syndrome. We will repeat nerve conduction studies with EMG. Patient advised that if her symptoms worsen or she develops new symptoms she should let us know. She will follow-up in 6 months with Dr. Krista Blue.    Ward Givens, MSN, NP-C 04/09/2016, 7:37 AM [facility] [address], [place], [place] [address] [phone]

## 2016-04-10 ENCOUNTER — Telehealth: Payer: Self-pay | Admitting: *Deleted

## 2016-04-10 LAB — COMPREHENSIVE METABOLIC PANEL
A/G RATIO: 1.4 (ref 1.2–2.2)
ALK PHOS: 110 IU/L (ref 39–117)
ALT: 11 IU/L (ref 0–32)
AST: 17 IU/L (ref 0–40)
Albumin: 3.8 g/dL (ref 3.6–4.8)
BUN/Creatinine Ratio: 15 (ref 12–28)
BUN: 10 mg/dL (ref 8–27)
Bilirubin Total: 0.4 mg/dL (ref 0.0–1.2)
CO2: 27 mmol/L (ref 18–29)
Calcium: 8.9 mg/dL (ref 8.7–10.3)
Chloride: 101 mmol/L (ref 96–106)
Creatinine, Ser: 0.65 mg/dL (ref 0.57–1.00)
GFR calc Af Amer: 107 (ref >59)
GFR calc non Af Amer: 93 (ref >59)
GLOBULIN, TOTAL: 2.7 (ref 1.5–4.5)
Glucose: 82 mg/dL (ref 65–99)
Potassium: 4.9 mmol/L (ref 3.5–5.2)
SODIUM: 141 mmol/L (ref 134–144)
Total Protein: 6.5 g/dL (ref 6.0–8.5)

## 2016-04-10 LAB — CBC WITH DIFFERENTIAL/PLATELET
Basophils Absolute: 0 10*3/uL (ref 0.0–0.2)
Basos: 0 %
EOS (ABSOLUTE): 0.1 10*3/uL (ref 0.0–0.4)
Eos: 1 %
Hematocrit: 37.3 % (ref 34.0–46.6)
Hemoglobin: 11.8 g/dL (ref 11.1–15.9)
IMMATURE GRANULOCYTES: 0 %
Immature Grans (Abs): 0 10*3/uL (ref 0.0–0.1)
Lymphocytes Absolute: 1.1 10*3/uL (ref 0.7–3.1)
Lymphs: 23 %
MCH: 25.4 pg — ABNORMAL LOW (ref 26.6–33.0)
MCHC: 31.6 g/dL (ref 31.5–35.7)
MCV: 80 fL (ref 79–97)
MONOS ABS: 0.4 10*3/uL (ref 0.1–0.9)
Monocytes: 7 %
NEUTROS PCT: 69 %
Neutrophils Absolute: 3.5 10*3/uL (ref 1.4–7.0)
PLATELETS: 425 10*3/uL — AB (ref 150–379)
RBC: 4.64 x10E6/uL (ref 3.77–5.28)
RDW: 18.4 % — AB (ref 12.3–15.4)
WBC: 5 10*3/uL (ref 3.4–10.8)

## 2016-04-10 NOTE — Telephone Encounter (Signed)
-----   Message from Ward Givens, NP sent at 04/10/2016 10:48 AM EST ----- Lab work consistent with previous lab work. PLease call patient.

## 2016-04-10 NOTE — Telephone Encounter (Signed)
LMVM home (ok per DPR).  that labs consistent with previous labs,  Pt to call if questions.

## 2016-04-11 NOTE — Progress Notes (Signed)
I have reviewed and agreed above plan. 

## 2016-04-15 DIAGNOSIS — Z1231 Encounter for screening mammogram for malignant neoplasm of breast: Secondary | ICD-10-CM | POA: Diagnosis not present

## 2016-04-15 DIAGNOSIS — Z803 Family history of malignant neoplasm of breast: Secondary | ICD-10-CM | POA: Diagnosis not present

## 2016-04-29 ENCOUNTER — Other Ambulatory Visit: Payer: Self-pay | Admitting: Adult Health

## 2016-05-15 ENCOUNTER — Encounter (INDEPENDENT_AMBULATORY_CARE_PROVIDER_SITE_OTHER): Payer: Self-pay | Admitting: Neurology

## 2016-05-15 ENCOUNTER — Ambulatory Visit (INDEPENDENT_AMBULATORY_CARE_PROVIDER_SITE_OTHER): Payer: PPO | Admitting: Neurology

## 2016-05-15 DIAGNOSIS — M79605 Pain in left leg: Secondary | ICD-10-CM

## 2016-05-15 DIAGNOSIS — Z0289 Encounter for other administrative examinations: Secondary | ICD-10-CM

## 2016-05-15 NOTE — Procedures (Signed)
Full Name: Andrea Cantrell Gender: Female MRN #: [id number] Date of Birth: [birth year]    Visit Date: 05/15/2016 11:43 Age: 66 Years 49 Months Old Examining Physician: r Blaine Hamper History: 66 year old female with history of relapsing remitting multiple sclerosis, chronic low back pain,  Summary of the test: Nerve conduction study: Bilateral sural sensory responses were normal. Bilateral peroneal to EDB and tibial motor responses were normal. Bilateral tibial H reflexes were normal and symmetric.  Electromyography: Selective needle examinations of bilateral lower extremity muscles and bilateral lumbar sacral paraspinal muscles were normal.  Conclusion:  This is a normal study. There is no electrodiagnostic evidence of large fiber peripheral neuropathy or bilateral lumbosacral radiculopathy.   ------------------------------- Marcial Pacas, M.D.  [facility] [place], [place] [address] Tel: [phone] Fax: [phone]        [facility]    Nerve / Sites Rec. Site Peak Lat Ref.  Amp Ref. Segments Distance    ms ms V V  cm  R Superficial peroneal - Ankle     2 Ankle 2.7  11  2  - Lat leg   L Superficial peroneal - Ankle     Lat leg Ankle 2.5 ?4.4 16 ?6 Lat leg - Ankle 14     [facility]NC    Nerve / Sites Muscle Latency Ref. Amplitude Ref. Rel Amp Segments Distance Velocity Ref. Area    ms ms mV mV %  cm m/s m/s mVms  R Peroneal - EDB     Ankle EDB 3.6 ?6.5 6.9 ?2.0 100 Ankle - EDB 9   14.1     Fib head EDB 10.6  6.1  89 Fib head - Ankle 36 52 ?44 13.1     Pop fossa EDB 12.1  5.4  87.1 Pop fossa - Fib head 10 64 ?44 11.7         Pop fossa - Ankle      L Peroneal - EDB     Ankle EDB 4.2 ?6.5 3.7 ?2.0 100 Ankle - EDB 9   8.8     Fib head EDB 10.8  3.8  101 Fib head - Ankle 36 54 ?44 8.9     Pop fossa EDB 12.4  3.3  87.2 Pop fossa - Fib head 10 64 ?44 7.7         Pop fossa - Ankle      R Tibial - AH     Ankle AH 4.1 ?5.8 5.9 ?4.0 100 Ankle - AH 9    10.3     Pop fossa AH 11.8  4.7  79 Pop fossa - Ankle 43 55 ?41 9.5  L Tibial - AH     Ankle AH 2.2 ?5.8 8.0 ?4.0 100 Ankle - AH 9   15.8     Pop fossa AH 11.7  6.4  80.3 Pop fossa - Ankle 43 45 ?41 14.8     H Reflex    Nerve H Lat Lat Hmax   ms ms   Left Right Ref. Left Right Ref.  Tibial - [place] 34.8 33.5 ?35.0 31.9 32.2 ?35.0     EMG       EMG Summary Table    Spontaneous MUAP Recruitment  Muscle IA Fib PSW Fasc Other Amp Dur. Poly Pattern  R. Tibialis anterior Normal None None None _______ Normal Normal Normal Normal  R. Gastrocnemius (Medial head) Normal None None None _______ Normal Normal Normal Normal  R. Vastus lateralis  Normal None None None _______ Normal Normal Normal Normal  L. Tibialis anterior Normal None None None _______ Normal Normal Normal Normal  L. Gastrocnemius (Medial head) Normal None None None _______ Normal Normal Normal Normal  L. Vastus lateralis Normal None None None _______ Normal Normal Normal Normal  R. Lumbar paraspinals (mid) Normal None None None _______ Normal Normal Normal Normal  R. Lumbar paraspinals (low) Normal None None None _______ Normal Normal Normal Normal  L. Lumbar paraspinals (mid) Normal None None None _______ Normal Normal Normal Normal  L. Lumbar paraspinals (low) Normal None None None _______ Normal Normal Normal Normal

## 2016-05-20 ENCOUNTER — Telehealth: Payer: Self-pay

## 2016-05-20 NOTE — Telephone Encounter (Signed)
-----   Message from Ward Givens, NP sent at 05/20/2016  2:06 PM EDT ----- Study is normal. Please make patient aware

## 2016-05-20 NOTE — Telephone Encounter (Signed)
I spoke to patient and she is aware of results.  

## 2016-05-21 DIAGNOSIS — M25562 Pain in left knee: Secondary | ICD-10-CM | POA: Diagnosis not present

## 2016-05-25 ENCOUNTER — Telehealth: Payer: Self-pay | Admitting: *Deleted

## 2016-05-25 ENCOUNTER — Other Ambulatory Visit: Payer: Self-pay | Admitting: Neurology

## 2016-05-25 ENCOUNTER — Other Ambulatory Visit: Payer: Self-pay | Admitting: *Deleted

## 2016-05-25 MED ORDER — CELECOXIB 100 MG PO CAPS: 100.0000 mg | ORAL_CAPSULE | Freq: Two times a day (BID) | ORAL | 5 refills | Status: DC | PRN

## 2016-05-25 NOTE — Telephone Encounter (Signed)
She will continue to take duloxetine 60mg , daily for management of pain.  Dr. Krista Blue has asked her to stop Tramadol and use Celebrex 100mg , BID PRN instead.  Pt is agreeable to the change and a prescription has been sent to the pharmacy.    Also, Dr. Krista Blue noted a decline in her HGB.  Pt is aware and states she is following up on this matter.  She has a pending colonoscopy.

## 2016-05-28 ENCOUNTER — Ambulatory Visit: Payer: PPO | Admitting: Cardiology

## 2016-06-08 ENCOUNTER — Ambulatory Visit: Payer: PPO | Admitting: Cardiology

## 2016-06-16 DIAGNOSIS — S83282A Other tear of lateral meniscus, current injury, left knee, initial encounter: Secondary | ICD-10-CM | POA: Diagnosis not present

## 2016-06-16 DIAGNOSIS — M25562 Pain in left knee: Secondary | ICD-10-CM | POA: Diagnosis not present

## 2016-06-17 ENCOUNTER — Ambulatory Visit (INDEPENDENT_AMBULATORY_CARE_PROVIDER_SITE_OTHER): Payer: PPO | Admitting: Cardiology

## 2016-06-17 ENCOUNTER — Encounter: Payer: Self-pay | Admitting: Cardiology

## 2016-06-17 VITALS — BP 110/50 | HR 86 | Ht 62.0 in | Wt 192.0 lb

## 2016-06-17 DIAGNOSIS — R0902 Hypoxemia: Secondary | ICD-10-CM | POA: Diagnosis not present

## 2016-06-17 DIAGNOSIS — I10 Essential (primary) hypertension: Secondary | ICD-10-CM | POA: Diagnosis not present

## 2016-06-17 DIAGNOSIS — R0609 Other forms of dyspnea: Secondary | ICD-10-CM

## 2016-06-17 DIAGNOSIS — I358 Other nonrheumatic aortic valve disorders: Secondary | ICD-10-CM

## 2016-06-17 DIAGNOSIS — E785 Hyperlipidemia, unspecified: Secondary | ICD-10-CM

## 2016-06-17 DIAGNOSIS — Z716 Tobacco abuse counseling: Secondary | ICD-10-CM

## 2016-06-17 DIAGNOSIS — E669 Obesity, unspecified: Secondary | ICD-10-CM | POA: Diagnosis not present

## 2016-06-17 NOTE — Patient Instructions (Addendum)
SCHEDULE [facility] has requested that you have an echocardiogram. Echocardiography is a painless test that uses sound waves to create images of your heart. It provides your doctor with information about the size and shape of your heart and how well your heart's chambers and valves are working. This procedure takes approximately one hour. There are no restrictions for this procedure.   NO OTHER CHANGES   Your physician wants you to follow-up in [place]. You will receive a reminder letter in the mail two months in advance. If you don't receive a letter, please call our office to schedule the follow-up appointment.   If you need a refill on your cardiac medications before your next appointment, please call your pharmacy.

## 2016-06-17 NOTE — Progress Notes (Signed)
PCP: Vidal Schwalbe, MD  Clinic Note: Chief Complaint  Patient presents with  . Follow-up    occassional chest pain-id smokes too much, no shortness of breath, no edema, has pain in legs, no lightheaded or dizziness    HPI: Andrea Cantrell is a 66 y.o. female with a PMH below who presents today for Annual follow-up.  She has a history of discoid lupus, fibromyalgia, hypertension and hyperlipidemia as well as multiple sclerosis. She has chronic pain. I saw her back in October 2015 consultation for chest pain with exertional dyspnea. She never followed back up again after stress test. She basically describes spiking chest pain sensations on the left upper chest under her breast.   Myoview 11/28/2013: NORMAL STRESS TEST. LOW RISK. No ischemia or infarction. Normal LV function. EF 71%  Andrea Cantrell was last seen on 05/21/2015 -- mostly needed pills to be refilled. And not had any more chest pain just noticed weight gain.  Recent Hospitalizations: [facility] acquired pneumonia admission December 2017  Studies Personally Reviewed -(if available, images/films reviewed: From Epic Chart or Care Everywhere)  Interval History: Andrea Cantrell presents today for prolonged follow-up and is doing okay. She is really limited now by knee pain. Been getting injections by her orthopedic surgeon (Dr. Veverly Fells). Unfortunately these injections are not doing as well as they had been. As result she's really not able to exercise anymore. What she notes is that she may get a little short of breath and even some discomfort in her chest if she smokes too long or too much - usually associated with wheezing.. She still has the "spiking flip-flop sensation in her chest "after drinking caffeine. This is no change from before. She does get short of breath if she exerts herself, but a knowledge is that she is quite deconditioned.  No PND, orthopnea or edema.  Other than the "spiking type flip-flop sensations", she denies any  palpitations.  No lightheadedness, dizziness, weakness or syncope/near syncope. No TIA/amaurosis fugax symptoms. No melena, hematochezia, hematuria, or epstaxis. No claudication.  ROS: A comprehensive was performed. Review of Systems  Constitutional: Negative for malaise/fatigue.  HENT: Positive for congestion.   Respiratory: Positive for cough and shortness of breath.   Gastrointestinal: Negative for blood in stool, constipation and diarrhea.  Genitourinary: Positive for frequency. Negative for hematuria and urgency.  Musculoskeletal: Positive for back pain (L Lower back / chest --> after smoking a lot or over-exerting.).  Neurological: Positive for tingling (On the left arm and side/occasionally leg when she lies on her right side).  Endo/Heme/Allergies: Positive for environmental allergies.  Psychiatric/Behavioral: Negative for hallucinations and memory loss. The patient is nervous/anxious.   All other systems reviewed and are negative.   Still smoking  I have reviewed and (if needed) personally updated the patient's problem list, medications, allergies, past medical and surgical history, social and family history.   Past Medical History:  Diagnosis Date  . Discoid lupus   . Essential hypertension   . Fibromyalgia   . Heavy smoker (more than 20 cigarettes per day)    Was able to stop for 11 years but restarted about 10 years ago  . Hyperlipidemia with target LDL less than 130   . MS (multiple sclerosis) ([place])   . Obesity (BMI 35.0-39.9 without comorbidity)   . Spinal stenosis     Past Surgical History:  Procedure Laterality Date  . ABDOMINAL HYSTERECTOMY  1995  . BREAST SURGERY Left    tissue removal  . CESAREAN  [place]  . CHOLECYSTECTOMY  1994  . NM MYOVIEW LTD  October 2015   EF 71%. No ischemia or infarction. Low risk/normal  . ROTATOR CUFF REPAIR Left June 2016    Current Meds  Medication Sig  . albuterol (PROVENTIL HFA;VENTOLIN HFA) 108 (90  BASE) MCG/ACT inhaler Inhale 2 puffs into the lungs every 6 (six) hours as needed for wheezing or shortness of breath (wheezing and shortness of breath). For shortness of breath.  Marland Kitchen amLODipine (NORVASC) 5 MG tablet Take 5 mg by mouth every morning.   Marland Kitchen aspirin EC 81 MG tablet Take 162 mg by mouth every evening.   Marland Kitchen atorvastatin (LIPITOR) 10 MG tablet Take 10 mg by mouth every morning.   . baclofen (LIORESAL) 10 MG tablet Take 1 tablet (10 mg total) by mouth 3 (three) times daily.  . celecoxib (CELEBREX) 100 MG capsule Take 1 capsule (100 mg total) by mouth 2 (two) times daily as needed.  . Dimethyl Fumarate (TECFIDERA) 240 MG CPDR TAKE ONE CAPSULE (240 MG) BY MOUTH TWO TIMES DAILY . STORE AT ROOM TEMP IN THE ORIGINAL CONTAINER. DO NOT CRUSH OR CHEW. SWALLOW WHOLE.  . DULoxetine (CYMBALTA) 60 MG capsule Take 1 capsule (60 mg total) by mouth daily.  Marland Kitchen gabapentin (NEURONTIN) 300 MG capsule Take 300 mg by mouth 3 (three) times daily.   Marland Kitchen ibuprofen (ADVIL,MOTRIN) 800 MG tablet Take 1 tablet (800 mg total) by mouth 3 (three) times daily.  Marland Kitchen VITAMIN D, CHOLECALCIFEROL, PO Take 10,000 Units by mouth every evening.     No Known Allergies  Social History   Social History  . Marital status: Legally Separated    Spouse name: N/A  . Number of children: 2  . Years of education: 12+   Social History Main Topics  . Smoking status: Current Every Day Smoker    Packs/day: 1.00    Types: Cigarettes  . Smokeless tobacco: Never Used  . Alcohol use No  . Drug use: No  . Sexual activity: Not Asked   Other Topics Concern  . None   Social History Narrative   Patient lives at home with granddaughter her her 2 children.    Patient is separated. Patient has 2 children.    Patient has some college. Patient is on Disability.    Smokes roughly one pack a day. No alcohol.    family history includes Cancer in her father and mother; Diabetes in her brother and maternal grandmother.  Wt Readings from Last 3  Encounters:  06/17/16 192 lb (87.1 kg)  04/09/16 196 lb (88.9 kg)  11/11/15 194 lb 3.2 oz (88.1 kg)    PHYSICAL EXAM BP (!) 110/50   Pulse 86   Ht 5\' 2"  (1.575 m)   Wt 192 lb (87.1 kg)   BMI 35.12 kg/m  General appearance: alert, cooperative, appears stated age, no distress and moderately obese Neck: no adenopathy, no carotid bruit and no JVD Lungs: clear to auscultation bilaterally, normal percussion bilaterally and non-labored Heart: regular rate and rhythm, S1, S2 normal, 1/6 SEM @ RUSB. No click, rub or gallop; non-displaced PMI Abdomen: soft, non-tender; bowel sounds normal; no masses,  no organomegaly; truncal obesity Extremities: extremities normal, atraumatic, no cyanosis, and edema - n/a Pulses: 2+ and symmetric; Skin: mobility and turgor normal, no edema, no lesions noted, temperature normal and texture normal or  Neurologic: Mental status: Alert, oriented, thought content appropriate; Pleasant mood & affect.  Grossly normal  Adult ECG Report n/a  Other studies Reviewed: Additional studies/ records that were reviewed today include:  Recent Labs:  No Lipids available. Lab Results  Component Value Date   CREATININE 0.65 04/09/2016   BUN 10 04/09/2016   NA 141 04/09/2016   K 4.9 04/09/2016   CL 101 04/09/2016   CO2 27 04/09/2016   No results found for: CHOL, HDL, LDLCALC, LDLDIRECT, TRIG, CHOLHDL   ASSESSMENT / PLAN: Problem List Items Addressed This Visit    Aortic systolic murmur on examination    Probably not really new murmur. I just hurt at this time around. We will evaluate with an echocardiogram just to ensure that there is no aortic stenosis to be concerned about. Sounds more consistent with aortic sclerosis.      Relevant Orders   ECHOCARDIOGRAM COMPLETE   DOE (dyspnea on exertion) - Primary    Again multifactorial related to obesity, deconditioning. No ischemia on recent Myoview that was done for similar symptoms.  Continue diet, exercise and  weight loss.      Relevant Orders   ECHOCARDIOGRAM COMPLETE   Essential hypertension (Chronic)    Well-controlled on amlodipine. No change      Hyperlipidemia with target LDL less than 130 (Chronic)    Based on her wrist, her target LDL should be closer to 101 130. She is only on low-dose atorvastatin. No longer on Zetia or fenofibrate. No labs available to compare to - > defer to PCP      Hypoxia   Obesity (BMI 35.0-39.9 without comorbidity) ([place]) (Chronic)    Unable to lose weight. Partially she's not able to exercise. Needs to monitor diet.      Tobacco abuse counseling (Chronic)    I admonished her again that she really needs quit smoking. She is having. Back pain issues with smoking, she short of breath with it and she is certainly not helping her cardiovascular risk. I don't think she is quite ready to quit. She is going to start contemplating various options. I recommended that she continues to follow this up with her PCP and then hopefully we'll see her back in the year having quit altogether. We talked about different options of management, but would defer to PCP who will likely see her more frequently  4 min.         Current medicines are reviewed at length with the patient today. (+/- concerns) none The following changes have been made: none  Patient Instructions  SCHEDULE [address] [place] has requested that you have an echocardiogram. Echocardiography is a painless test that uses sound waves to create images of your heart. It provides your doctor with information about the size and shape of your heart and how well your heart's chambers and valves are working. This procedure takes approximately one hour. There are no restrictions for this procedure.   NO OTHER CHANGES   Your physician wants you to follow-up in [place]. You will receive a reminder letter in the mail two months in advance. If you don't receive a letter,  please call our office to schedule the follow-up appointment.   If you need a refill on your cardiac medications before your next appointment, please call your pharmacy.    Studies Ordered:   Orders Placed This Encounter  Procedures  . ECHOCARDIOGRAM COMPLETE      Andrea Cantrell, M.D., M.S. Interventional Cardiologist   Pager # [phone] Phone # [phone] [address]  [place], [place] [address]

## 2016-06-19 ENCOUNTER — Encounter: Payer: Self-pay | Admitting: Cardiology

## 2016-06-19 DIAGNOSIS — Z716 Tobacco abuse counseling: Secondary | ICD-10-CM | POA: Insufficient documentation

## 2016-06-19 NOTE — Assessment & Plan Note (Signed)
Again multifactorial related to obesity, deconditioning. No ischemia on recent Myoview that was done for similar symptoms.  Continue diet, exercise and weight loss.

## 2016-06-19 NOTE — Assessment & Plan Note (Signed)
Unable to lose weight. Partially she's not able to exercise. Needs to monitor diet.

## 2016-06-19 NOTE — Assessment & Plan Note (Addendum)
I admonished her again that she really needs quit smoking. She is having. Back pain issues with smoking, she short of breath with it and she is certainly not helping her cardiovascular risk. I don't think she is quite ready to quit. She is going to start contemplating various options. I recommended that she continues to follow this up with her PCP and then hopefully we'll see her back in the year having quit altogether. We talked about different options of management, but would defer to PCP who will likely see her more frequently  4 min.

## 2016-06-19 NOTE — Assessment & Plan Note (Signed)
Based on her wrist, her target LDL should be closer to 101 130. She is only on low-dose atorvastatin. No longer on Zetia or fenofibrate. No labs available to compare to - > defer to PCP

## 2016-06-19 NOTE — Assessment & Plan Note (Signed)
Probably not really new murmur. I just hurt at this time around. We will evaluate with an echocardiogram just to ensure that there is no aortic stenosis to be concerned about. Sounds more consistent with aortic sclerosis.

## 2016-06-19 NOTE — Assessment & Plan Note (Signed)
Well-controlled on amlodipine. No change

## 2016-06-20 ENCOUNTER — Encounter (HOSPITAL_COMMUNITY): Payer: Self-pay

## 2016-06-20 ENCOUNTER — Emergency Department (HOSPITAL_COMMUNITY)
Admission: EM | Admit: 2016-06-20 | Discharge: 2016-06-20 | Disposition: A | Discharge status: Home / Self Care | Payer: PPO | Attending: Emergency Medicine | Admitting: Emergency Medicine

## 2016-06-20 DIAGNOSIS — R197 Diarrhea, unspecified: Secondary | ICD-10-CM | POA: Diagnosis not present

## 2016-06-20 DIAGNOSIS — Z7982 Long term (current) use of aspirin: Secondary | ICD-10-CM | POA: Diagnosis not present

## 2016-06-20 DIAGNOSIS — Z79899 Other long term (current) drug therapy: Secondary | ICD-10-CM | POA: Insufficient documentation

## 2016-06-20 DIAGNOSIS — I10 Essential (primary) hypertension: Secondary | ICD-10-CM | POA: Insufficient documentation

## 2016-06-20 DIAGNOSIS — F17210 Nicotine dependence, cigarettes, uncomplicated: Secondary | ICD-10-CM | POA: Diagnosis not present

## 2016-06-20 DIAGNOSIS — A084 Viral intestinal infection, unspecified: Secondary | ICD-10-CM | POA: Insufficient documentation

## 2016-06-20 LAB — CBC
HCT: 40.6 % (ref 36.0–46.0)
HEMOGLOBIN: 13.5 g/dL (ref 12.0–15.0)
MCH: 25.1 pg — ABNORMAL LOW (ref 26.0–34.0)
MCHC: 33.3 g/dL (ref 30.0–36.0)
MCV: 75.6 fL — ABNORMAL LOW (ref 78.0–100.0)
PLATELETS: 385 10*3/uL (ref 150–400)
RBC: 5.37 MIL/uL — AB (ref 3.87–5.11)
RDW: 16.9 % — ABNORMAL HIGH (ref 11.5–15.5)
WBC: 5.7 10*3/uL (ref 4.0–10.5)

## 2016-06-20 LAB — URINALYSIS, ROUTINE W REFLEX MICROSCOPIC
Bilirubin Urine: NEGATIVE
Glucose, UA: NEGATIVE mg/dL
HGB URINE DIPSTICK: NEGATIVE
Ketones, ur: NEGATIVE mg/dL
LEUKOCYTES UA: NEGATIVE
Nitrite: NEGATIVE
PROTEIN: NEGATIVE mg/dL
SPECIFIC GRAVITY, URINE: 1.014 (ref 1.005–1.030)
pH: 6 (ref 5.0–8.0)

## 2016-06-20 LAB — COMPREHENSIVE METABOLIC PANEL
ALBUMIN: 4.1 g/dL (ref 3.5–5.0)
ALK PHOS: 110 U/L (ref 38–126)
ALT: 14 U/L (ref 14–54)
ANION GAP: 7 (ref 5–15)
AST: 16 U/L (ref 15–41)
BILIRUBIN TOTAL: 0.8 mg/dL (ref 0.3–1.2)
BUN: 17 mg/dL (ref 6–20)
CALCIUM: 9.1 mg/dL (ref 8.9–10.3)
CHLORIDE: 102 mmol/L (ref 101–111)
CO2: 30 mmol/L (ref 22–32)
CREATININE: 0.66 mg/dL (ref 0.44–1.00)
GFR calc Af Amer: >60 mL/min (ref >60)
Glucose, Bld: 100 mg/dL — ABNORMAL HIGH (ref 65–99)
Potassium: 3.7 mmol/L (ref 3.5–5.1)
Sodium: 139 mmol/L (ref 135–145)
Total Protein: 7.7 g/dL (ref 6.5–8.1)

## 2016-06-20 LAB — LIPASE, BLOOD: Lipase: 21 U/L (ref 11–51)

## 2016-06-20 MED ORDER — ONDANSETRON 8 MG PO TBDP: 8.0000 mg | ORAL_TABLET | Freq: Three times a day (TID) | ORAL | 0 refills | Status: DC | PRN

## 2016-06-20 MED ORDER — SODIUM CHLORIDE 0.9 % IV BOLUS (SEPSIS)
1000.0000 mL | Freq: Once | INTRAVENOUS | Status: AC
  Administered 2016-06-20: 1000 mL via INTRAVENOUS

## 2016-06-20 MED ORDER — LORAZEPAM 2 MG/ML IJ SOLN
0.5000 mg | Freq: Once | INTRAMUSCULAR | Status: AC
  Administered 2016-06-20: 0.5 mg via INTRAVENOUS
  Filled 2016-06-20: qty 1

## 2016-06-20 MED ORDER — ONDANSETRON HCL 4 MG/2ML IJ SOLN
4.0000 mg | Freq: Once | INTRAMUSCULAR | Status: AC
  Administered 2016-06-20: 4 mg via INTRAVENOUS
  Filled 2016-06-20: qty 2

## 2016-06-20 MED ORDER — DIPHENOXYLATE-ATROPINE 2.5-0.025 MG PO TABS: 2.0000 | ORAL_TABLET | Freq: Four times a day (QID) | ORAL | 0 refills | Status: DC | PRN

## 2016-06-20 NOTE — ED Provider Notes (Signed)
[facility] DEPT Provider Note   CSN: [id number] Arrival date & time: 06/20/16  1324     History   Chief Complaint No chief complaint on file.   HPI Andrea Cantrell is a 66 y.o. female.  66 year old female presents with 24-hour history of watery diarrhea and nonbilious vomiting. Subjective myalgias but did not take her temperature. Denies any cough or congestion. Denies any sick exposures. Does have some abdominal cramping prior to the start of the symptoms. Denies any bad food exposures. Symptoms have been progressively worse and no treatment use prior to arrival.      Past Medical History:  Diagnosis Date  . Discoid lupus   . Essential hypertension   . Fibromyalgia   . Heavy smoker (more than 20 cigarettes per day)    Was able to stop for 11 years but restarted about 10 years ago  . Hyperlipidemia with target LDL less than 130   . MS (multiple sclerosis) ([place])   . Obesity (BMI 35.0-39.9 without comorbidity)   . Spinal stenosis     Patient Active Problem List   Diagnosis Date Noted  . Tobacco abuse counseling 06/19/2016  . Aortic systolic murmur on examination 06/17/2016  . Lumbar radiculopathy 04/17/2014  . Dizziness 04/13/2014  . Acute bronchitis 04/13/2014  . Hypoxia 04/12/2014  . Left-sided low back pain with left-sided sciatica 03/26/2014  . DOE (dyspnea on exertion) 11/20/2013  . Essential hypertension   . Hyperlipidemia with target LDL less than 130   . Obesity (BMI 35.0-39.9 without comorbidity) ([place])   . Multiple sclerosis ([place]) 07/26/2012  . Other nonspecific abnormal result of function study of brain and central nervous system 07/26/2012  . Disturbance of skin sensation 07/26/2012    Past Surgical History:  Procedure Laterality Date  . ABDOMINAL HYSTERECTOMY  1995  . BREAST SURGERY Left    tissue removal  . [place]  . CHOLECYSTECTOMY  1994  . NM MYOVIEW LTD  October 2015   EF 71%. No ischemia or infarction. Low  risk/normal  . ROTATOR CUFF REPAIR Left June 2016    OB History    No data available       Home Medications    Prior to Admission medications   Medication Sig Start Date End Date Taking? Authorizing Provider  albuterol (PROVENTIL HFA;VENTOLIN HFA) 108 (90 BASE) MCG/ACT inhaler Inhale 2 puffs into the lungs every 6 (six) hours as needed for wheezing or shortness of breath (wheezing and shortness of breath). For shortness of breath.    [provider]  amLODipine (NORVASC) 5 MG tablet Take 5 mg by mouth every morning.     [provider]  aspirin EC 81 MG tablet Take 162 mg by mouth every evening.     [provider]  atorvastatin (LIPITOR) 10 MG tablet Take 10 mg by mouth every morning.     [provider]  baclofen (LIORESAL) 10 MG tablet Take 1 tablet (10 mg total) by mouth 3 (three) times daily. 04/09/16   Ward Givens, NP  celecoxib (CELEBREX) 100 MG capsule Take 1 capsule (100 mg total) by mouth 2 (two) times daily as needed. 05/25/16   Marcial Pacas, MD  Dimethyl Fumarate (TECFIDERA) 240 MG CPDR TAKE ONE CAPSULE (240 MG) BY MOUTH TWO TIMES DAILY . STORE AT ROOM TEMP IN THE ORIGINAL CONTAINER. DO NOT CRUSH OR CHEW. SWALLOW WHOLE. 04/09/16   Ward Givens, NP  DULoxetine (CYMBALTA) 60 MG capsule Take 1 capsule (60 mg  total) by mouth daily. 07/23/15   Ward Givens, NP  gabapentin (NEURONTIN) 300 MG capsule Take 300 mg by mouth 3 (three) times daily.  01/20/16   [provider]  ibuprofen (ADVIL,MOTRIN) 800 MG tablet Take 1 tablet (800 mg total) by mouth 3 (three) times daily. 10/15/15   Dowless, Samantha Tripp, PA-C  VITAMIN D, CHOLECALCIFEROL, PO Take 10,000 Units by mouth every evening.     [provider]    Family History Family History  Problem Relation Age of Onset  . Cancer Mother   . Cancer Father   . Diabetes Brother   . Diabetes Maternal Grandmother     Social History Social History  Substance Use Topics  . Smoking  status: Current Every Day Smoker    Packs/day: 1.00    Types: Cigarettes  . Smokeless tobacco: Never Used  . Alcohol use No     Allergies   Patient has no known allergies.   Review of Systems Review of Systems  All other systems reviewed and are negative.    Physical Exam Updated Vital Signs BP (!) 144/87 (BP Location: Right Arm)   Pulse 84   Temp 98 F (36.7 C) (Oral)   Resp 18   SpO2 99%   Physical Exam  Constitutional: She is oriented to person, place, and time. She appears well-developed and well-nourished.  Non-toxic appearance. No distress.  HENT:  Head: Normocephalic and atraumatic.  Eyes: Conjunctivae, EOM and lids are normal. Pupils are equal, round, and reactive to light.  Neck: Normal range of motion. Neck supple. No tracheal deviation present. No thyroid mass present.  Cardiovascular: Normal rate, regular rhythm and normal heart sounds.  Exam reveals no gallop.   No murmur heard. Pulmonary/Chest: Effort normal and breath sounds normal. No stridor. No respiratory distress. She has no decreased breath sounds. She has no wheezes. She has no rhonchi. She has no rales.  Abdominal: Soft. Normal appearance and bowel sounds are normal. She exhibits no distension. There is no tenderness. There is no rebound and no CVA tenderness.  Musculoskeletal: Normal range of motion. She exhibits no edema or tenderness.  Neurological: She is alert and oriented to person, place, and time. She has normal strength. No cranial nerve deficit or sensory deficit. GCS eye subscore is 4. GCS verbal subscore is 5. GCS motor subscore is 6.  Skin: Skin is warm and dry. No abrasion and no rash noted.  Psychiatric: She has a normal mood and affect. Her speech is normal and behavior is normal.  Nursing note and vitals reviewed.    ED Treatments / Results  Labs (all labs ordered are listed, but only abnormal results are displayed) Labs Reviewed  CBC - Abnormal; Notable for the following:        Result Value   RBC 5.37 (*)    MCV 75.6 (*)    MCH 25.1 (*)    RDW 16.9 (*)    All other components within normal limits  LIPASE, BLOOD  COMPREHENSIVE METABOLIC PANEL  URINALYSIS, ROUTINE W REFLEX MICROSCOPIC    EKG  EKG Interpretation None       Radiology No results found.  Procedures Procedures (including critical care time)  Medications Ordered in ED Medications  sodium chloride 0.9 % bolus 1,000 mL (not administered)  ondansetron (ZOFRAN) injection 4 mg (not administered)  LORazepam (ATIVAN) injection 0.5 mg (not administered)     Initial Impression / Assessment and Plan / ED Course  I have reviewed the triage vital  signs and the nursing notes.  Pertinent labs & imaging results that were available during my care of the patient were reviewed by me and considered in my medical decision making (see chart for details).     Patient medicated here feels much better. Suspect gastroenteritis and will discharge home.  Final Clinical Impressions(s) / ED Diagnoses   Final diagnoses:  None    New Prescriptions New Prescriptions   No medications on file     Lacretia Leigh, MD 06/20/16 1250

## 2016-06-20 NOTE — ED Triage Notes (Signed)
She c/o n/v/d; few episodes since yesterday evening. She also c/o some discomfort at low abd.

## 2016-06-24 NOTE — Addendum Note (Signed)
Addended by: Ulice Brilliant T on: 06/24/2016 03:45 PM   Modules accepted: Orders

## 2016-07-02 ENCOUNTER — Other Ambulatory Visit (HOSPITAL_COMMUNITY): Payer: PPO

## 2016-07-06 ENCOUNTER — Ambulatory Visit (HOSPITAL_COMMUNITY): Payer: PPO

## 2016-07-07 ENCOUNTER — Encounter: Payer: Self-pay | Admitting: Adult Health

## 2016-07-17 HISTORY — PX: TRANSTHORACIC ECHOCARDIOGRAM: SHX275

## 2016-07-20 ENCOUNTER — Ambulatory Visit (HOSPITAL_COMMUNITY): Payer: PPO

## 2016-07-29 ENCOUNTER — Ambulatory Visit (HOSPITAL_COMMUNITY): Payer: PPO | Attending: Cardiology

## 2016-07-29 ENCOUNTER — Other Ambulatory Visit: Payer: Self-pay

## 2016-07-29 DIAGNOSIS — R0609 Other forms of dyspnea: Secondary | ICD-10-CM

## 2016-07-29 DIAGNOSIS — I358 Other nonrheumatic aortic valve disorders: Secondary | ICD-10-CM | POA: Diagnosis not present

## 2016-07-29 DIAGNOSIS — I071 Rheumatic tricuspid insufficiency: Secondary | ICD-10-CM | POA: Diagnosis not present

## 2016-09-21 ENCOUNTER — Telehealth: Payer: Self-pay | Admitting: Cardiology

## 2016-09-21 NOTE — Telephone Encounter (Signed)
Patient called with echo results/explanation. Advised no f/up is needed until 06/2017. She voiced understanding. Routed to Dr. Stann Mainland w/[facility] since patient needs knee surgery via epic fax [phone]

## 2016-09-21 NOTE — Telephone Encounter (Signed)
Pt calling to see if she was to have a fu after Echo done in June, it was done due to an upcoming surgery on her knee, recall in for May 2019. She did not understand the results on mychart, wanting to get those and see if sooner fu is needed-pls call

## 2016-09-21 NOTE — Telephone Encounter (Signed)
Notes recorded by Leonie Man, MD on 08/01/2016 at 3:25 PM EDT 2-D echo results showed normal pump function with upper normal ejection fraction of 60-65%. No regional wall motion abnormalities. No suggestion of aortic valve disease or other valvular disease. Nothing to explain shortness of breath or any potential murmur.  Overall good news. Glenetta Hew, MD

## 2016-10-07 ENCOUNTER — Encounter: Payer: Self-pay | Admitting: Neurology

## 2016-10-07 ENCOUNTER — Ambulatory Visit (INDEPENDENT_AMBULATORY_CARE_PROVIDER_SITE_OTHER): Payer: PPO | Admitting: Neurology

## 2016-10-07 VITALS — BP 118/80 | HR 78 | Ht 62.0 in | Wt 186.0 lb

## 2016-10-07 DIAGNOSIS — M5416 Radiculopathy, lumbar region: Secondary | ICD-10-CM | POA: Diagnosis not present

## 2016-10-07 DIAGNOSIS — G35D Multiple sclerosis, unspecified: Secondary | ICD-10-CM

## 2016-10-07 DIAGNOSIS — G35 Multiple sclerosis: Secondary | ICD-10-CM | POA: Diagnosis not present

## 2016-10-07 MED ORDER — GABAPENTIN 800 MG PO TABS: 800.0000 mg | ORAL_TABLET | Freq: Three times a day (TID) | ORAL | 4 refills | Status: DC

## 2016-10-07 MED ORDER — DULOXETINE HCL 60 MG PO CPEP: 60.0000 mg | ORAL_CAPSULE | Freq: Every day | ORAL | 4 refills | Status: DC

## 2016-10-07 MED ORDER — TRAMADOL HCL 50 MG PO TABS: 50.0000 mg | ORAL_TABLET | Freq: Four times a day (QID) | ORAL | 3 refills | Status: DC | PRN

## 2016-10-07 NOTE — Progress Notes (Signed)
PATIENT: Andrea Cantrell DOB: 1950/10/09  REASON FOR VISIT: follow up HISTORY FROM: patient  HISTORY OF PRESENT ILLNESS: HISTORY   HISTORY (Andrea Cantrell): Andrea Cantrell, is a 66 year old female returns for followup of relapsing remitting multiple sclerosis  She has a history of multiple sclerosis since 1990s, also with past medical history of hypertension, hyperlipidemia, fibromyalgia, and discoid lupus in the 1990s. She rarely has a flareup from her lupus   She has episodes of gait difficulty, in 2006, took about 6 months to recover. From then on, she kept on having episodes of worsening gait difficulty, blurry vision. Diagnosis was made in 2013, based upon abnormal MRI.  MRI scan of the brain shows multiple periventricular lesions solitary enhancing left frontal subcortical lesion with multiple nonenhancing periventricular and subcortical hypointense lesions consistent with myelinating disease.   MRI of the cervical spine with mild degenerative changes but no enhancing lesions are noted.   She was started ontecfidera in October 2013and has tolerated the medication extremely well. Initially she thought she developed a rash but apparently that was due to a GI virus and that has disappeared.   UPDATE June 10th 2015:  She still complains of low back, lower extremity pain, muscle spasm at her toes, she went to hospital in Jul 05 2013, complains of chest tightness, chest pain, near syncope episode, EKG was normal, chest x-ray was normal, laboratory showed normal CBC, CMP with exception of mild elevated alkaline phosphate 140, she was diagnosed with dehydration, which has improved with IV fluid, and pain medications, normal nuclear stress test in October 2015  She now complains of left neck pain, bilateral lower extremity pain  UPDATE Feb 8th 2016: She complains of right knee pain, right foot numbness since Mar 18 2014, going up her right leg, when she put pressure on her right  foot, she noticed right toe pain, going up her right leg, She also complains of low back pain, going down her left hip and left leg, she also has right arm intermittent sharp pain.  She went to ED in Dec 27th 2015 for worsening left side low back pain, shooting pain to left leg.   MRI lumbar in February 2016, severe facet arthrosis at L4-5 with new grade 1 anterolisthesis and severe spinal stenosis. Moderate multifactorial spinal stenosis and moderate left neural foraminal stenosis at L3-4.  She has developed severe right shoulder pain in March 2016, hematoma at right forearm, MRI right shoulder March 24th 2016: Complete tear of the supraspinatus tendon with 3.4 cm of retraction. Complete tear of the intraarticular portion of the long head of the biceps tendon. Mild osteoarthritis of the glenohumeral joint. Moderate degenerative changes of the acromioclavicular joint.  She will have right rotator cuff surgery in July 23 2014 by Dr. Veverly Fells, has stopped aspirin in June first 2016, ibuprofen 5 days prior to that.  She also complains of chronic low back pain, worsening with prolonged walking, or bearing weight, is taking tramadol 50 mg 2 tablets twice a day, which has been effective.  UPDATE Dec 5th 2016: She did have right rotator cuff surgery in June of 2016 by orthopedic surgeon Dr. Veverly Fells, recovered very well, she continue have low back pain, radiating pain to bilateral lower extremity, especially early morning, she denies bowel and bladder incontinence, no significant gait difficulty, but complains of increased low back pain, radiating pain to bilateral lower extremity and lack of stamina We have personally reviewed MRI of lumbar spine in February 2016,  multilevel degenerative changes, most severe at L4-5, with moderate to severe spinal canal stenosis  UPDATE 09/11/15 CMMs. Andrea Cantrell is a 66 year old female with a history of relapsed remitting multiple sclerosis.She was last seen in the office  by [facility] 07/23/2015.She continues on Tecfidera and tolerates it well. She denies any new weakness. She is complaining of more left arm numbness and tingling .She denies any changes with the bowels or bladder. Denies any significant changes with her gait or balance. She denies any recent falls She does report some blurred vision however after her recent eye exam glasses were recommended She does complain of some neck stiffness that radiates to the right shoulder. She is on baclofen which helps with this She does have some low back pain that has been ongoing. She does not feel that her pain has gotten worse. She states that her pain is usually dependent on her activity level for the day. She takes tramadol to control her discomfort. She returns today for an evaluation.  UPDATE October 07 2016: Repeat laboratory in May 2018 showed normal hemoglobin 13.5,She complains of worsening bilateral knee pain, gait abnormality,  is seeing orthopedic surgeon, from  she is stable   REVIEW OF SYSTEMS: Out of a complete 14 system review of symptoms, the patient complains only of the following symptoms, and all other reviewed systems are negative.  Knee pain, gait abnormality  ALLERGIES: No Known Allergies  HOME MEDICATIONS: Outpatient Medications Prior to Visit  Medication Sig Dispense Refill  . albuterol (PROVENTIL HFA;VENTOLIN HFA) 108 (90 BASE) MCG/ACT inhaler Inhale 2 puffs into the lungs every 6 (six) hours as needed for wheezing or shortness of breath (wheezing and shortness of breath). For shortness of breath.    Marland Kitchen amLODipine (NORVASC) 5 MG tablet Take 5 mg by mouth every morning.     Marland Kitchen aspirin EC 81 MG tablet Take 162 mg by mouth every evening.     Marland Kitchen atorvastatin (LIPITOR) 10 MG tablet Take 10 mg by mouth every morning.     . baclofen (LIORESAL) 10 MG tablet Take 1 tablet (10 mg total) by mouth 3 (three) times daily. 270 tablet 3  . Dimethyl Fumarate (TECFIDERA) 240 MG CPDR TAKE ONE CAPSULE (240  MG) BY MOUTH TWO TIMES DAILY . STORE AT ROOM TEMP IN THE ORIGINAL CONTAINER. DO NOT CRUSH OR CHEW. SWALLOW WHOLE. 180 capsule 3  . DULoxetine (CYMBALTA) 60 MG capsule Take 1 capsule (60 mg total) by mouth daily. 30 capsule 11  . VITAMIN D, CHOLECALCIFEROL, PO Take 10,000 Units by mouth every evening.     . celecoxib (CELEBREX) 100 MG capsule Take 1 capsule (100 mg total) by mouth 2 (two) times daily as needed. 60 capsule 5  . diphenoxylate-atropine (LOMOTIL) 2.5-0.025 MG tablet Take 2 tablets by mouth 4 (four) times daily as needed for diarrhea or loose stools. 30 tablet 0  . gabapentin (NEURONTIN) 300 MG capsule Take 300 mg by mouth 3 (three) times daily.     . ondansetron (ZOFRAN ODT) 8 MG disintegrating tablet Take 1 tablet (8 mg total) by mouth every 8 (eight) hours as needed for nausea or vomiting. 20 tablet 0   No facility-administered medications prior to visit.     PAST MEDICAL HISTORY: Past Medical History:  Diagnosis Date  . Discoid lupus   . Essential hypertension   . Fibromyalgia   . Heavy smoker (more than 20 cigarettes per day)    Was able to stop for 11 years but restarted about 10 years  ago  . Hyperlipidemia with target LDL less than 130   . Andrea (multiple sclerosis) ([place])   . Obesity (BMI 35.0-39.9 without comorbidity)   . Spinal stenosis     PAST SURGICAL HISTORY: Past Surgical History:  Procedure Laterality Date  . ABDOMINAL HYSTERECTOMY  1995  . BREAST SURGERY Left    tissue removal  . [place]  . CHOLECYSTECTOMY  1994  . NM MYOVIEW LTD  October 2015   EF 71%. No ischemia or infarction. Low risk/normal  . ROTATOR CUFF REPAIR Left June 2016    FAMILY HISTORY: Family History  Problem Relation Age of Onset  . Cancer Mother   . Cancer Father   . Diabetes Brother   . Diabetes Maternal Grandmother     SOCIAL HISTORY: Social History   Social History  . Marital status: Legally Separated    Spouse name: N/A  . Number of children: 2    . Years of education: 12+   Occupational History  . Not on file.   Social History Main Topics  . Smoking status: Current Every Day Smoker    Packs/day: 1.00    Types: Cigarettes  . Smokeless tobacco: Never Used  . Alcohol use No  . Drug use: No  . Sexual activity: Not on file   Other Topics Concern  . Not on file   Social History Narrative   Patient lives at home with granddaughter her her 2 children.    Patient is separated. Patient has 2 children.    Patient has some college. Patient is on Disability.    Smokes roughly one pack a day. No alcohol.      PHYSICAL EXAM  Vitals:   10/07/16 1120  BP: 118/80  Pulse: 78  Weight: 186 lb (84.4 kg)  Height: 5\' 2"  (1.575 m)   Body mass index is 34.02 kg/m.  Generalized: Well developed, in no acute distress   Neurological examination  Mentation: Alert oriented to time, place, history taking. Follows all commands speech and language fluent Cranial nerve II-XII: Pupils were equal round reactive to light. Extraocular movements were full, visual field were full on confrontational test. Facial sensation and strength were normal. Uvula tongue midline. Head turning and shoulder shrug  were normal and symmetric. Motor: The motor testing reveals 5 over 5 strength of all 4 extremities. Good symmetric motor tone is noted throughout. Discomfort to palpation on the left lower back and buttocks. Sensory: Sensory testing is intact to soft touch on all 4 extremities. No evidence of extinction is noted.  Coordination: Cerebellar testing reveals good finger-nose-finger and heel-to-shin bilaterally.  Gait and station: Antalgic, cautious mildly unsteady Reflexes: Deep tendon reflexes are symmetric and normal bilaterally.   DIAGNOSTIC DATA (LABS, IMAGING, TESTING) - I reviewed patient records, labs, notes, testing and imaging myself where available.  Lab Results  Component Value Date   WBC 5.7 06/20/2016   HGB 13.5 06/20/2016   HCT 40.6  06/20/2016   MCV 75.6 (L) 06/20/2016   PLT 385 06/20/2016      Component Value Date/Time   NA 139 06/20/2016 1004   NA 141 04/09/2016 0906   K 3.7 06/20/2016 1004   CL 102 06/20/2016 1004   CO2 30 06/20/2016 1004   GLUCOSE 100 (H) 06/20/2016 1004   BUN 17 06/20/2016 1004   BUN 10 04/09/2016 0906   CREATININE 0.66 06/20/2016 1004   CALCIUM 9.1 06/20/2016 1004   PROT 7.7 06/20/2016 1004   PROT 6.5  04/09/2016 0906   ALBUMIN 4.1 06/20/2016 1004   ALBUMIN 3.8 04/09/2016 0906   AST 16 06/20/2016 1004   ALT 14 06/20/2016 1004   ALKPHOS 110 06/20/2016 1004   BILITOT 0.8 06/20/2016 1004   BILITOT 0.4 04/09/2016 0906   GFRNONAA >60 06/20/2016 1004   GFRAA >60 06/20/2016 1004    Lab Results  Component Value Date   TSH 0.739 04/13/2014      ASSESSMENT AND PLAN 66 y.o. year old female   Relapsing remitting multiple sclerosis  Doing well on Tecfidera twice a day  We have reviewed most recent MRI of the brain in June 2017, multi foci of T2/FLAIR Andrea lesions, no contrast enhancement, no change compared to 2016   Chronic low back pain, lumbar radiculopathy  I refilled her Cymbalta 60 mg once a day, gabapentin 800 mg 3 times a day, tramadol 50 mg as needed  Marcial Pacas, M.D. Ph.D.  [facility] [place], [place] [address] Phone: [phone] Fax:      [phone]

## 2016-10-20 DIAGNOSIS — S83282D Other tear of lateral meniscus, current injury, left knee, subsequent encounter: Secondary | ICD-10-CM | POA: Diagnosis not present

## 2016-10-20 DIAGNOSIS — M25562 Pain in left knee: Secondary | ICD-10-CM | POA: Diagnosis not present

## 2016-10-26 DIAGNOSIS — M25562 Pain in left knee: Secondary | ICD-10-CM | POA: Diagnosis not present

## 2016-11-04 DIAGNOSIS — M1711 Unilateral primary osteoarthritis, right knee: Secondary | ICD-10-CM | POA: Diagnosis not present

## 2016-11-04 DIAGNOSIS — M25562 Pain in left knee: Secondary | ICD-10-CM | POA: Diagnosis not present

## 2016-11-04 DIAGNOSIS — S83282D Other tear of lateral meniscus, current injury, left knee, subsequent encounter: Secondary | ICD-10-CM | POA: Diagnosis not present

## 2016-11-06 DIAGNOSIS — K5730 Diverticulosis of large intestine without perforation or abscess without bleeding: Secondary | ICD-10-CM | POA: Diagnosis not present

## 2016-11-06 DIAGNOSIS — Z1211 Encounter for screening for malignant neoplasm of colon: Secondary | ICD-10-CM | POA: Diagnosis not present

## 2016-11-06 DIAGNOSIS — Z8371 Family history of colonic polyps: Secondary | ICD-10-CM | POA: Diagnosis not present

## 2016-11-06 DIAGNOSIS — Z800 Family history of malignant neoplasm of digestive organs: Secondary | ICD-10-CM | POA: Diagnosis not present

## 2016-11-11 DIAGNOSIS — M25539 Pain in unspecified wrist: Secondary | ICD-10-CM | POA: Diagnosis not present

## 2016-11-11 DIAGNOSIS — I10 Essential (primary) hypertension: Secondary | ICD-10-CM | POA: Diagnosis not present

## 2016-11-11 DIAGNOSIS — I700 Atherosclerosis of aorta: Secondary | ICD-10-CM | POA: Diagnosis not present

## 2016-11-11 DIAGNOSIS — E785 Hyperlipidemia, unspecified: Secondary | ICD-10-CM | POA: Diagnosis not present

## 2016-11-11 DIAGNOSIS — Z23 Encounter for immunization: Secondary | ICD-10-CM | POA: Diagnosis not present

## 2016-11-11 DIAGNOSIS — F325 Major depressive disorder, single episode, in full remission: Secondary | ICD-10-CM | POA: Diagnosis not present

## 2016-11-11 DIAGNOSIS — M1712 Unilateral primary osteoarthritis, left knee: Secondary | ICD-10-CM | POA: Diagnosis not present

## 2016-11-11 DIAGNOSIS — G35 Multiple sclerosis: Secondary | ICD-10-CM | POA: Diagnosis not present

## 2016-11-11 DIAGNOSIS — F17210 Nicotine dependence, cigarettes, uncomplicated: Secondary | ICD-10-CM | POA: Diagnosis not present

## 2016-11-23 DIAGNOSIS — M4807 Spinal stenosis, lumbosacral region: Secondary | ICD-10-CM | POA: Diagnosis not present

## 2016-11-26 ENCOUNTER — Other Ambulatory Visit: Payer: Self-pay | Admitting: Orthopedic Surgery

## 2016-11-26 DIAGNOSIS — M4807 Spinal stenosis, lumbosacral region: Secondary | ICD-10-CM

## 2016-12-09 ENCOUNTER — Emergency Department (HOSPITAL_COMMUNITY): Payer: PPO

## 2016-12-09 ENCOUNTER — Inpatient Hospital Stay (HOSPITAL_COMMUNITY)
Admission: EM | Admit: 2016-12-09 | Discharge: 2016-12-13 | DRG: 355 | Disposition: A | Discharge status: Home / Self Care | Payer: PPO | Attending: General Surgery | Admitting: General Surgery

## 2016-12-09 ENCOUNTER — Emergency Department
Admission: RE | Admit: 2016-12-09 | Discharge: 2016-12-09 | Disposition: A | Discharge status: Home / Self Care | Payer: PPO | Source: Ambulatory Visit | Attending: Orthopedic Surgery | Admitting: Orthopedic Surgery

## 2016-12-09 ENCOUNTER — Emergency Department: Payer: PPO

## 2016-12-09 ENCOUNTER — Inpatient Hospital Stay (HOSPITAL_COMMUNITY): Payer: PPO | Admitting: Anesthesiology

## 2016-12-09 ENCOUNTER — Encounter (HOSPITAL_COMMUNITY): Admission: EM | Disposition: A | Discharge status: Home / Self Care | Payer: Self-pay | Source: Home / Self Care

## 2016-12-09 ENCOUNTER — Encounter (HOSPITAL_COMMUNITY): Payer: Self-pay | Admitting: Emergency Medicine

## 2016-12-09 DIAGNOSIS — K56600 Partial intestinal obstruction, unspecified as to cause: Secondary | ICD-10-CM | POA: Diagnosis not present

## 2016-12-09 DIAGNOSIS — L930 Discoid lupus erythematosus: Secondary | ICD-10-CM | POA: Diagnosis present

## 2016-12-09 DIAGNOSIS — Z9049 Acquired absence of other specified parts of digestive tract: Secondary | ICD-10-CM

## 2016-12-09 DIAGNOSIS — G35 Multiple sclerosis: Secondary | ICD-10-CM | POA: Diagnosis present

## 2016-12-09 DIAGNOSIS — G8929 Other chronic pain: Secondary | ICD-10-CM | POA: Diagnosis present

## 2016-12-09 DIAGNOSIS — M797 Fibromyalgia: Secondary | ICD-10-CM | POA: Diagnosis not present

## 2016-12-09 DIAGNOSIS — Z431 Encounter for attention to gastrostomy: Secondary | ICD-10-CM | POA: Diagnosis not present

## 2016-12-09 DIAGNOSIS — Z79899 Other long term (current) drug therapy: Secondary | ICD-10-CM | POA: Diagnosis not present

## 2016-12-09 DIAGNOSIS — I10 Essential (primary) hypertension: Secondary | ICD-10-CM | POA: Diagnosis not present

## 2016-12-09 DIAGNOSIS — Z6834 Body mass index (BMI) 34.0-34.9, adult: Secondary | ICD-10-CM

## 2016-12-09 DIAGNOSIS — Z833 Family history of diabetes mellitus: Secondary | ICD-10-CM | POA: Diagnosis not present

## 2016-12-09 DIAGNOSIS — M4800 Spinal stenosis, site unspecified: Secondary | ICD-10-CM | POA: Diagnosis present

## 2016-12-09 DIAGNOSIS — K420 Umbilical hernia with obstruction, without gangrene: Secondary | ICD-10-CM

## 2016-12-09 DIAGNOSIS — E669 Obesity, unspecified: Secondary | ICD-10-CM | POA: Diagnosis not present

## 2016-12-09 DIAGNOSIS — F17210 Nicotine dependence, cigarettes, uncomplicated: Secondary | ICD-10-CM | POA: Diagnosis not present

## 2016-12-09 DIAGNOSIS — K430 Incisional hernia with obstruction, without gangrene: Secondary | ICD-10-CM | POA: Diagnosis not present

## 2016-12-09 DIAGNOSIS — Z978 Presence of other specified devices: Secondary | ICD-10-CM

## 2016-12-09 DIAGNOSIS — E785 Hyperlipidemia, unspecified: Secondary | ICD-10-CM | POA: Diagnosis not present

## 2016-12-09 DIAGNOSIS — R1110 Vomiting, unspecified: Secondary | ICD-10-CM | POA: Diagnosis not present

## 2016-12-09 DIAGNOSIS — Z7982 Long term (current) use of aspirin: Secondary | ICD-10-CM | POA: Diagnosis not present

## 2016-12-09 DIAGNOSIS — Z4682 Encounter for fitting and adjustment of non-vascular catheter: Secondary | ICD-10-CM | POA: Diagnosis not present

## 2016-12-09 DIAGNOSIS — R112 Nausea with vomiting, unspecified: Secondary | ICD-10-CM | POA: Diagnosis not present

## 2016-12-09 DIAGNOSIS — Z90710 Acquired absence of both cervix and uterus: Secondary | ICD-10-CM

## 2016-12-09 DIAGNOSIS — R109 Unspecified abdominal pain: Secondary | ICD-10-CM | POA: Diagnosis not present

## 2016-12-09 DIAGNOSIS — Z01818 Encounter for other preprocedural examination: Secondary | ICD-10-CM

## 2016-12-09 HISTORY — PX: INCISIONAL HERNIA REPAIR: SHX193

## 2016-12-09 LAB — COMPREHENSIVE METABOLIC PANEL
ALK PHOS: 118 U/L (ref 38–126)
ALT: 16 U/L (ref 14–54)
ANION GAP: 15 (ref 5–15)
AST: 22 U/L (ref 15–41)
Albumin: 4.5 g/dL (ref 3.5–5.0)
BUN: 11 mg/dL (ref 6–20)
CHLORIDE: 100 mmol/L — AB (ref 101–111)
CO2: 24 mmol/L (ref 22–32)
CREATININE: 0.79 mg/dL (ref 0.44–1.00)
Calcium: 9.9 mg/dL (ref 8.9–10.3)
Glucose, Bld: 150 mg/dL — ABNORMAL HIGH (ref 65–99)
Potassium: 3.9 mmol/L (ref 3.5–5.1)
SODIUM: 139 mmol/L (ref 135–145)
Total Bilirubin: 0.9 mg/dL (ref 0.3–1.2)
Total Protein: 8.5 g/dL — ABNORMAL HIGH (ref 6.5–8.1)

## 2016-12-09 LAB — TROPONIN I: Troponin I: <0.03 ng/mL (ref <0.03)

## 2016-12-09 LAB — URINALYSIS, ROUTINE W REFLEX MICROSCOPIC
Bilirubin Urine: NEGATIVE
GLUCOSE, UA: NEGATIVE mg/dL
HGB URINE DIPSTICK: NEGATIVE
Ketones, ur: NEGATIVE mg/dL
LEUKOCYTES UA: NEGATIVE
Nitrite: NEGATIVE
PH: 8 (ref 5.0–8.0)
PROTEIN: NEGATIVE mg/dL
Specific Gravity, Urine: >1.046 — ABNORMAL HIGH (ref 1.005–1.030)

## 2016-12-09 LAB — CBC
HEMATOCRIT: 40.1 % (ref 36.0–46.0)
Hemoglobin: 13.3 g/dL (ref 12.0–15.0)
MCH: 25.3 pg — AB (ref 26.0–34.0)
MCHC: 33.2 g/dL (ref 30.0–36.0)
MCV: 76.4 fL — AB (ref 78.0–100.0)
Platelets: 520 10*3/uL — ABNORMAL HIGH (ref 150–400)
RBC: 5.25 MIL/uL — AB (ref 3.87–5.11)
RDW: 17.1 % — AB (ref 11.5–15.5)
WBC: 12.8 10*3/uL — ABNORMAL HIGH (ref 4.0–10.5)

## 2016-12-09 LAB — LIPASE, BLOOD: LIPASE: 28 U/L (ref 11–51)

## 2016-12-09 SURGERY — REPAIR, HERNIA, INCISIONAL, LAPAROSCOPIC
Anesthesia: General | Site: Abdomen

## 2016-12-09 MED ORDER — LACTATED RINGERS IV SOLN
INTRAVENOUS | Status: DC
  Administered 2016-12-09 (×2): via INTRAVENOUS

## 2016-12-09 MED ORDER — ONDANSETRON 4 MG PO TBDP: 4.0000 mg | ORAL_TABLET | Freq: Four times a day (QID) | ORAL | Status: DC | PRN

## 2016-12-09 MED ORDER — SUGAMMADEX SODIUM 200 MG/2ML IV SOLN
INTRAVENOUS | Status: AC
  Filled 2016-12-09: qty 2

## 2016-12-09 MED ORDER — ONDANSETRON HCL 4 MG/2ML IJ SOLN
4.0000 mg | Freq: Once | INTRAMUSCULAR | Status: AC | PRN
  Administered 2016-12-09: 4 mg via INTRAVENOUS
  Filled 2016-12-09: qty 2

## 2016-12-09 MED ORDER — MORPHINE SULFATE (PF) 2 MG/ML IV SOLN
2.0000 mg | INTRAVENOUS | Status: DC | PRN
  Administered 2016-12-09 – 2016-12-12 (×6): 2 mg via INTRAVENOUS
  Filled 2016-12-09 (×6): qty 1

## 2016-12-09 MED ORDER — LIDOCAINE 2% (20 MG/ML) 5 ML SYRINGE
INTRAMUSCULAR | Status: AC
  Filled 2016-12-09: qty 5

## 2016-12-09 MED ORDER — ONDANSETRON HCL 4 MG/2ML IJ SOLN
INTRAMUSCULAR | Status: DC | PRN
  Administered 2016-12-09: 4 mg via INTRAVENOUS

## 2016-12-09 MED ORDER — ONDANSETRON HCL 4 MG/2ML IJ SOLN
INTRAMUSCULAR | Status: AC
Start: 2016-12-09 — End: ?
  Filled 2016-12-09: qty 2

## 2016-12-09 MED ORDER — BUPIVACAINE-EPINEPHRINE (PF) 0.25% -1:200000 IJ SOLN
INTRAMUSCULAR | Status: AC
  Filled 2016-12-09: qty 30

## 2016-12-09 MED ORDER — METOCLOPRAMIDE HCL 5 MG/ML IJ SOLN: 10.0000 mg | Freq: Once | INTRAMUSCULAR | Status: DC | PRN

## 2016-12-09 MED ORDER — FENTANYL CITRATE (PF) 250 MCG/5ML IJ SOLN
INTRAMUSCULAR | Status: AC
  Filled 2016-12-09: qty 5

## 2016-12-09 MED ORDER — DEXAMETHASONE SODIUM PHOSPHATE 10 MG/ML IJ SOLN
INTRAMUSCULAR | Status: AC
  Filled 2016-12-09: qty 1

## 2016-12-09 MED ORDER — ALBUTEROL SULFATE (2.5 MG/3ML) 0.083% IN NEBU: 2.5000 mg | INHALATION_SOLUTION | Freq: Four times a day (QID) | RESPIRATORY_TRACT | Status: DC | PRN

## 2016-12-09 MED ORDER — PROPOFOL 10 MG/ML IV BOLUS
INTRAVENOUS | Status: DC | PRN
  Administered 2016-12-09: 150 mg via INTRAVENOUS
  Administered 2016-12-09: 50 mg via INTRAVENOUS

## 2016-12-09 MED ORDER — PROPOFOL 10 MG/ML IV BOLUS
INTRAVENOUS | Status: AC
  Filled 2016-12-09: qty 20

## 2016-12-09 MED ORDER — LACTATED RINGERS IV SOLN
INTRAVENOUS | Status: DC
  Administered 2016-12-09: 19:00:00 via INTRAVENOUS
  Administered 2016-12-09: 100 mL/h via INTRAVENOUS
  Administered 2016-12-10: 22:00:00 via INTRAVENOUS

## 2016-12-09 MED ORDER — LACTATED RINGERS IR SOLN
Status: DC | PRN
  Administered 2016-12-09: 1000 mL

## 2016-12-09 MED ORDER — FENTANYL CITRATE (PF) 100 MCG/2ML IJ SOLN
25.0000 ug | INTRAMUSCULAR | Status: DC | PRN
  Administered 2016-12-09 (×2): 25 ug via INTRAVENOUS

## 2016-12-09 MED ORDER — FENTANYL CITRATE (PF) 100 MCG/2ML IJ SOLN
INTRAMUSCULAR | Status: DC | PRN
  Administered 2016-12-09: 100 ug via INTRAVENOUS
  Administered 2016-12-09 (×3): 50 ug via INTRAVENOUS

## 2016-12-09 MED ORDER — LIDOCAINE 2% (20 MG/ML) 5 ML SYRINGE
INTRAMUSCULAR | Status: DC | PRN
  Administered 2016-12-09: 50 mg via INTRAVENOUS

## 2016-12-09 MED ORDER — ROCURONIUM BROMIDE 10 MG/ML (PF) SYRINGE
PREFILLED_SYRINGE | INTRAVENOUS | Status: DC | PRN
  Administered 2016-12-09: 10 mg via INTRAVENOUS
  Administered 2016-12-09: 50 mg via INTRAVENOUS
  Administered 2016-12-09: 10 mg via INTRAVENOUS

## 2016-12-09 MED ORDER — IOPAMIDOL (ISOVUE-300) INJECTION 61%
100.0000 mL | Freq: Once | INTRAVENOUS | Status: AC | PRN
  Administered 2016-12-09: 100 mL via INTRAVENOUS

## 2016-12-09 MED ORDER — ONDANSETRON HCL 4 MG/2ML IJ SOLN
INTRAMUSCULAR | Status: AC
  Filled 2016-12-09: qty 2

## 2016-12-09 MED ORDER — MORPHINE SULFATE (PF) 2 MG/ML IV SOLN: 2.0000 mg | INTRAVENOUS | Status: DC | PRN

## 2016-12-09 MED ORDER — POTASSIUM CHLORIDE 2 MEQ/ML IV SOLN: INTRAVENOUS | Status: DC

## 2016-12-09 MED ORDER — PANTOPRAZOLE SODIUM 40 MG IV SOLR
40.0000 mg | Freq: Every day | INTRAVENOUS | Status: DC
  Filled 2016-12-09: qty 40

## 2016-12-09 MED ORDER — DEXAMETHASONE SODIUM PHOSPHATE 10 MG/ML IJ SOLN
INTRAMUSCULAR | Status: DC | PRN
  Administered 2016-12-09: 10 mg via INTRAVENOUS

## 2016-12-09 MED ORDER — ONDANSETRON HCL 4 MG/2ML IJ SOLN: 4.0000 mg | Freq: Four times a day (QID) | INTRAMUSCULAR | Status: DC | PRN

## 2016-12-09 MED ORDER — HEPARIN SODIUM (PORCINE) 5000 UNIT/ML IJ SOLN
INTRAMUSCULAR | Status: AC
  Administered 2016-12-09: 5000 [IU] via SUBCUTANEOUS
  Filled 2016-12-09: qty 1

## 2016-12-09 MED ORDER — MIDAZOLAM HCL 2 MG/2ML IJ SOLN
INTRAMUSCULAR | Status: AC
  Filled 2016-12-09: qty 2

## 2016-12-09 MED ORDER — 0.9 % SODIUM CHLORIDE (POUR BTL) OPTIME
TOPICAL | Status: DC | PRN
  Administered 2016-12-09: 1000 mL

## 2016-12-09 MED ORDER — IOPAMIDOL (ISOVUE-300) INJECTION 61%
INTRAVENOUS | Status: AC
  Administered 2016-12-09: 100 mL via INTRAVENOUS
  Filled 2016-12-09: qty 100

## 2016-12-09 MED ORDER — SODIUM CHLORIDE 0.9 % IV BOLUS (SEPSIS)
1000.0000 mL | Freq: Once | INTRAVENOUS | Status: AC
  Administered 2016-12-09: 1000 mL via INTRAVENOUS

## 2016-12-09 MED ORDER — LACTATED RINGERS IV SOLN: INTRAVENOUS | Status: DC

## 2016-12-09 MED ORDER — MEPERIDINE HCL 50 MG/ML IJ SOLN: 6.2500 mg | INTRAMUSCULAR | Status: DC | PRN

## 2016-12-09 MED ORDER — BUPIVACAINE-EPINEPHRINE 0.25% -1:200000 IJ SOLN
INTRAMUSCULAR | Status: DC | PRN
  Administered 2016-12-09: 30 mL

## 2016-12-09 MED ORDER — CEFAZOLIN SODIUM-DEXTROSE 2-4 GM/100ML-% IV SOLN
INTRAVENOUS | Status: AC
  Filled 2016-12-09: qty 100

## 2016-12-09 MED ORDER — HYDROMORPHONE HCL 1 MG/ML IJ SOLN
INTRAMUSCULAR | Status: DC | PRN
  Administered 2016-12-09: 1 mg via INTRAVENOUS
  Administered 2016-12-09 (×2): 0.5 mg via INTRAVENOUS

## 2016-12-09 MED ORDER — SUCCINYLCHOLINE CHLORIDE 20 MG/ML IJ SOLN
INTRAMUSCULAR | Status: DC | PRN
  Administered 2016-12-09: 120 mg via INTRAVENOUS

## 2016-12-09 MED ORDER — PHENYLEPHRINE 40 MCG/ML (10ML) SYRINGE FOR IV PUSH (FOR BLOOD PRESSURE SUPPORT)
PREFILLED_SYRINGE | INTRAVENOUS | Status: AC
  Filled 2016-12-09: qty 10

## 2016-12-09 MED ORDER — MORPHINE SULFATE (PF) 4 MG/ML IV SOLN
4.0000 mg | Freq: Once | INTRAVENOUS | Status: AC
  Administered 2016-12-09: 4 mg via INTRAVENOUS
  Filled 2016-12-09: qty 1

## 2016-12-09 MED ORDER — FENTANYL CITRATE (PF) 100 MCG/2ML IJ SOLN
INTRAMUSCULAR | Status: AC
  Filled 2016-12-09: qty 2

## 2016-12-09 MED ORDER — CEFAZOLIN SODIUM-DEXTROSE 2-4 GM/100ML-% IV SOLN
2.0000 g | INTRAVENOUS | Status: AC
  Administered 2016-12-09: 2 g via INTRAVENOUS

## 2016-12-09 MED ORDER — NICOTINE 21 MG/24HR TD PT24: 21.0000 mg | MEDICATED_PATCH | Freq: Every day | TRANSDERMAL | Status: DC

## 2016-12-09 MED ORDER — HEPARIN SODIUM (PORCINE) 5000 UNIT/ML IJ SOLN
5000.0000 [IU] | Freq: Three times a day (TID) | INTRAMUSCULAR | Status: DC
  Administered 2016-12-09: 5000 [IU] via SUBCUTANEOUS

## 2016-12-09 MED ORDER — HYDROMORPHONE HCL 2 MG/ML IJ SOLN
INTRAMUSCULAR | Status: AC
  Filled 2016-12-09: qty 1

## 2016-12-09 MED ORDER — PHENYLEPHRINE 40 MCG/ML (10ML) SYRINGE FOR IV PUSH (FOR BLOOD PRESSURE SUPPORT)
PREFILLED_SYRINGE | INTRAVENOUS | Status: DC | PRN
  Administered 2016-12-09: 40 ug via INTRAVENOUS
  Administered 2016-12-09 (×3): 80 ug via INTRAVENOUS

## 2016-12-09 MED ORDER — MIDAZOLAM HCL 5 MG/5ML IJ SOLN
INTRAMUSCULAR | Status: DC | PRN
  Administered 2016-12-09: 2 mg via INTRAVENOUS

## 2016-12-09 SURGICAL SUPPLY — 45 items
BENZOIN TINCTURE PRP APPL 2/3 (GAUZE/BANDAGES/DRESSINGS) IMPLANT
BINDER ABDOMINAL 12 ML 46-62 (SOFTGOODS) ×3 IMPLANT
CHLORAPREP W/TINT 26ML (MISCELLANEOUS) ×3 IMPLANT
CLOSURE WOUND 1/2 X4 (GAUZE/BANDAGES/DRESSINGS)
COVER SURGICAL LIGHT HANDLE (MISCELLANEOUS) ×3 IMPLANT
DECANTER SPIKE VIAL GLASS SM (MISCELLANEOUS) ×3 IMPLANT
DERMABOND ADVANCED (GAUZE/BANDAGES/DRESSINGS) ×2
DERMABOND ADVANCED .7 DNX12 (GAUZE/BANDAGES/DRESSINGS) ×1 IMPLANT
DEVICE SECURE STRAP 25 ABSORB (INSTRUMENTS) ×3 IMPLANT
DEVICE TROCAR PUNCTURE CLOSURE (ENDOMECHANICALS) ×3 IMPLANT
DISSECTOR BLUNT TIP ENDO 5MM (MISCELLANEOUS) IMPLANT
DRAPE INCISE IOBAN 66X45 STRL (DRAPES) ×3 IMPLANT
DRSG OPSITE POSTOP 4X6 (GAUZE/BANDAGES/DRESSINGS) ×3 IMPLANT
ELECT REM PT RETURN 15FT ADLT (MISCELLANEOUS) ×3 IMPLANT
GLOVE BIOGEL PI IND STRL 7.5 (GLOVE) ×1 IMPLANT
GLOVE BIOGEL PI INDICATOR 7.5 (GLOVE) ×2
GLOVE ECLIPSE 7.5 STRL STRAW (GLOVE) ×3 IMPLANT
GOWN STRL REUS W/TWL XL LVL3 (GOWN DISPOSABLE) ×9 IMPLANT
KIT BASIN OR (CUSTOM PROCEDURE TRAY) ×3 IMPLANT
MARKER SKIN DUAL TIP RULER LAB (MISCELLANEOUS) ×3 IMPLANT
MESH VENTRALIGHT ST 4.5IN (Mesh General) ×3 IMPLANT
NEEDLE SPNL 22GX3.5 QUINCKE BK (NEEDLE) ×3 IMPLANT
PAD POSITIONING PINK XL (MISCELLANEOUS) IMPLANT
POSITIONER SURGICAL ARM (MISCELLANEOUS) IMPLANT
SCISSORS LAP 5X35 DISP (ENDOMECHANICALS) ×3 IMPLANT
SET IRRIG TUBING LAPAROSCOPIC (IRRIGATION / IRRIGATOR) IMPLANT
SHEARS HARMONIC ACE PLUS 36CM (ENDOMECHANICALS) ×3 IMPLANT
SLEEVE XCEL OPT CAN 5 100 (ENDOMECHANICALS) ×3 IMPLANT
SOLUTION ANTI FOG 6CC (MISCELLANEOUS) ×3 IMPLANT
STRIP CLOSURE SKIN 1/2X4 (GAUZE/BANDAGES/DRESSINGS) IMPLANT
SUT MNCRL AB 4-0 PS2 18 (SUTURE) ×3 IMPLANT
SUT NOVA NAB DX-16 0-1 5-0 T12 (SUTURE) ×3 IMPLANT
SUT NOVA NAB GS-21 0 18 T12 DT (SUTURE) ×3 IMPLANT
SUT PROLENE 0 CT 1 CR/8 (SUTURE) IMPLANT
SUT VIC AB 3-0 SH 27 (SUTURE) ×2
SUT VIC AB 3-0 SH 27XBRD (SUTURE) ×1 IMPLANT
TAPE CLOTH 4X10 WHT NS (GAUZE/BANDAGES/DRESSINGS) IMPLANT
TOWEL OR 17X26 10 PK STRL BLUE (TOWEL DISPOSABLE) ×3 IMPLANT
TOWEL OR NON WOVEN STRL DISP B (DISPOSABLE) ×3 IMPLANT
TRAY FOLEY W/METER SILVER 16FR (SET/KITS/TRAYS/PACK) ×3 IMPLANT
TRAY LAPAROSCOPIC (CUSTOM PROCEDURE TRAY) ×3 IMPLANT
TROCAR BLADELESS OPT 5 100 (ENDOMECHANICALS) ×3 IMPLANT
TROCAR XCEL BLUNT TIP 100MML (ENDOMECHANICALS) IMPLANT
TROCAR XCEL NON-BLD 11X100MML (ENDOMECHANICALS) IMPLANT
TUBING INSUF HEATED (TUBING) ×3 IMPLANT

## 2016-12-09 NOTE — Anesthesia Procedure Notes (Signed)
Procedure Name: Intubation Date/Time: 12/09/2016 2:44 PM Performed by: Anne Fu Pre-anesthesia Checklist: Patient identified, Emergency Drugs available, Suction available, Patient being monitored and Timeout performed Patient Re-evaluated:Patient Re-evaluated prior to induction Oxygen Delivery Method: Circle system utilized Preoxygenation: Pre-oxygenation with 100% oxygen Induction Type: IV induction, Rapid sequence and Cricoid Pressure applied Ventilation: Mask ventilation without difficulty and Mask ventilation with difficulty Laryngoscope Size: Mac and 4 Grade View: Grade III Tube type: Oral Tube size: 7.5 mm Number of attempts: 3 Airway Equipment and Method: Stylet,  Rigid stylet and Bougie stylet Placement Confirmation: ETT inserted through vocal cords under direct vision,  positive ETCO2 and breath sounds checked- equal and bilateral Secured at: 21 cm Tube secured with: Tape Dental Injury: Teeth and Oropharynx as per pre-operative assessment  Difficulty Due To: Difficulty was unanticipated and Difficult Airway- due to anterior larynx Future Recommendations: Recommend- induction with short-acting agent, and alternative techniques readily available Comments: RSI performed NG placed on suction prior to intubation.  CRNA MAC 4 GRADE III view tubed passed no noted CO2 on placement, tube removed MDA X 1 with MAC 4 Grade III - No CO2 present post placement.  Converted to Glidescope noted anterior larynx used bougie and cricoid pressure passed ETT without difficulty noted bi lat lung sounds positive ETCO2.  GRADE I view with Glidescope.

## 2016-12-09 NOTE — H&P (Signed)
Andrea Cantrell is an 66 y.o. female.    Chief Complaint: Abdominal pain, nausea and vomiting  HPI: Patient is a pleasant 66 year old female in her usual state of health until about 12 hours prior to admission. At that time she developed the fairly sudden onset of crampy mid abdominal pain followed by frequent nausea and vomiting. The pain intensified and vomiting continued and she presented to the emergency department. Normal bowel movement yesterday. She has not had any recent similar symptoms. She has not been aware of a hernia or bulge in her abdomen. Surgical history significant for previous hysterectomy through low midline incision and laparoscopic cholecystectomy.  Past Medical History:  Diagnosis Date  . Discoid lupus   . Essential hypertension   . Fibromyalgia   . Heavy smoker (more than 20 cigarettes per day)    Was able to stop for 11 years but restarted about 10 years ago  . Hyperlipidemia with target LDL less than 130   . MS (multiple sclerosis) ([place])   . Obesity (BMI 35.0-39.9 without comorbidity)   . Spinal stenosis     Past Surgical History:  Procedure Laterality Date  . ABDOMINAL HYSTERECTOMY  1995  . BREAST SURGERY Left    tissue removal  . [place]  . CHOLECYSTECTOMY  1994  . NM MYOVIEW LTD  October 2015   EF 71%. No ischemia or infarction. Low risk/normal  . ROTATOR CUFF REPAIR Left June 2016    Family History  Problem Relation Age of Onset  . Cancer Mother   . Cancer Father   . Diabetes Brother   . Diabetes Maternal Grandmother    Social History:  reports that she has been smoking Cigarettes.  She has been smoking about 1.00 pack per day. She has never used smokeless tobacco. She reports that she does not drink alcohol or use drugs.  Allergies: No Known Allergies   No current facility-administered medications for this encounter.    Current Outpatient Prescriptions  Medication Sig Dispense Refill  . albuterol (PROVENTIL  HFA;VENTOLIN HFA) 108 (90 BASE) MCG/ACT inhaler Inhale 2 puffs into the lungs every 6 (six) hours as needed for wheezing or shortness of breath (wheezing and shortness of breath). For shortness of breath.    Marland Kitchen amLODipine (NORVASC) 5 MG tablet Take 5 mg by mouth every morning.     Marland Kitchen aspirin EC 81 MG tablet Take 162 mg by mouth every evening.     Marland Kitchen atorvastatin (LIPITOR) 10 MG tablet Take 10 mg by mouth every morning.     . baclofen (LIORESAL) 10 MG tablet Take 1 tablet (10 mg total) by mouth 3 (three) times daily. 270 tablet 3  . Dimethyl Fumarate (TECFIDERA) 240 MG CPDR TAKE ONE CAPSULE (240 MG) BY MOUTH TWO TIMES DAILY . STORE AT ROOM TEMP IN THE ORIGINAL CONTAINER. DO NOT CRUSH OR CHEW. SWALLOW WHOLE. 180 capsule 3  . DULoxetine (CYMBALTA) 60 MG capsule Take 1 capsule (60 mg total) by mouth daily. 90 capsule 4  . gabapentin (NEURONTIN) 800 MG tablet Take 1 tablet (800 mg total) by mouth 3 (three) times daily. 270 tablet 4  . ibuprofen (ADVIL,MOTRIN) 200 MG tablet Take 600 mg by mouth every 6 (six) hours as needed for moderate pain.    . traMADol (ULTRAM) 50 MG tablet Take 1 tablet (50 mg total) by mouth every 6 (six) hours as needed. (Patient taking differently: Take 50 mg by mouth every 6 (six) hours as needed for moderate  pain. ) 90 tablet 3  . VITAMIN D, CHOLECALCIFEROL, PO Take 10,000 Units by mouth daily.        Results for orders placed or performed during the hospital encounter of 12/09/16 (from the past 48 hour(s))  Lipase, blood     Status: None   Collection Time: 12/09/16  3:40 AM  Result Value Ref Range   Lipase 28 11 - 51 U/L  Comprehensive metabolic panel     Status: Abnormal   Collection Time: 12/09/16  3:40 AM  Result Value Ref Range   Sodium 139 135 - 145 mmol/L   Potassium 3.9 3.5 - 5.1 mmol/L   Chloride 100 (L) 101 - 111 mmol/L   CO2 24 22 - 32 mmol/L   Glucose, Bld 150 (H) 65 - 99 mg/dL   BUN 11 6 - 20 mg/dL   Creatinine, Ser 0.79 0.44 - 1.00 mg/dL   Calcium 9.9 8.9  - 10.3 mg/dL   Total Protein 8.5 (H) 6.5 - 8.1 g/dL   Albumin 4.5 3.5 - 5.0 g/dL   AST 22 15 - 41 U/L   ALT 16 14 - 54 U/L   Alkaline Phosphatase 118 38 - 126 U/L   Total Bilirubin 0.9 0.3 - 1.2 mg/dL   GFR calc non Af Amer >60 >60 mL/min   GFR calc Af Amer >60 >60 mL/min    Comment: (NOTE) The eGFR has been calculated using the CKD EPI equation. This calculation has not been validated in all clinical situations. eGFR's persistently <60 mL/min signify possible Chronic Kidney Disease.    Anion gap 15 5 - 15  CBC     Status: Abnormal   Collection Time: 12/09/16  3:40 AM  Result Value Ref Range   WBC 12.8 (H) 4.0 - 10.5 K/uL   RBC 5.25 (H) 3.87 - 5.11 MIL/uL   Hemoglobin 13.3 12.0 - 15.0 g/dL   HCT 40.1 36.0 - 46.0 %   MCV 76.4 (L) 78.0 - 100.0 fL   MCH 25.3 (L) 26.0 - 34.0 pg   MCHC 33.2 30.0 - 36.0 g/dL   RDW 17.1 (H) 11.5 - 15.5 %   Platelets 520 (H) 150 - 400 K/uL  Troponin I     Status: None   Collection Time: 12/09/16  3:40 AM  Result Value Ref Range   Troponin I <0.03 <0.03 ng/mL   Dg Abd 1 View  Result Date: 12/09/2016 CLINICAL DATA:  Nasogastric tube placement.  Initial encounter. EXAM: ABDOMEN - 1 VIEW COMPARISON:  CT of the abdomen and pelvis performed earlier today at 5:17 a.m. FINDINGS: The patient's enteric tube is noted coiled overlying the fundus of the stomach. The side-port is also at the fundus of the stomach. The visualized bowel gas pattern is nonspecific, with air and fluid-filled small bowel loops again noted. Clips are noted within the right upper quadrant, reflecting prior cholecystectomy. The lungs are clear bilaterally. No focal consolidation, pleural effusion or pneumothorax is seen. The cardiomediastinal silhouette is borderline normal in size. No acute osseous abnormalities are seen. IMPRESSION: Enteric tube noted coiled overlying the fundus of the stomach. Electronically Signed   By: Garald Balding M.D.   On: 12/09/2016 06:52   Ct Abdomen Pelvis W  Contrast  Result Date: 12/09/2016 CLINICAL DATA:  Abdominal pain, nausea and vomiting, abdominal distension. Recent UTI. History of cholecystectomy, hysterectomy coma lupus. EXAM: CT ABDOMEN AND PELVIS WITH CONTRAST TECHNIQUE: Multidetector CT imaging of the abdomen and pelvis was performed using the standard protocol following  bolus administration of intravenous contrast. CONTRAST:  100 cc Isovue-300 COMPARISON:  Acute abdominal series December 09, 2016 at 0508 hours FINDINGS: LOWER CHEST: Lung bases are clear. Included heart size is normal. No pericardial effusion. HEPATOBILIARY: Status post cholecystectomy. Mild postprocedural intrahepatic biliary dilatation. Focal fatty infiltration about the falciform ligament. PANCREAS: Normal. SPLEEN: Normal. ADRENALS/URINARY TRACT: Kidneys are orthotopic, demonstrating symmetric enhancement. No nephrolithiasis, hydronephrosis or solid renal masses. Too small to characterize hypodensity upper pole RIGHT kidney. The unopacified ureters are normal in course and caliber. Delayed imaging through the kidneys demonstrates symmetric prompt contrast excretion within the proximal urinary collecting system. Urinary bladder is partially distended and unremarkable. Normal adrenal glands. STOMACH/BOWEL: Small hiatal hernia. Dilated fluid-filled small bowel to 3 cm, mild transition point/peristalsis RIGHT lower quadrant. Loop of small bowel courses into umbilical hernia, 2.4 cm neck, associated with small bowel transition point. No pneumatosis. No inflammatory changes or fluid within the hernia sac. Distal small bowel feces. Mild colonic diverticulosis. VASCULAR/LYMPHATIC: Aortoiliac vessels are normal in course and caliber. Mild calcific atherosclerosis. No lymphadenopathy by CT size criteria. REPRODUCTIVE: Status post hysterectomy. OTHER: No intraperitoneal free fluid or free air. Fat containing LEFT inguinal hernia. Small fat containing LEFT ventral infraumbilical hernia with  inflammatory changes. Phleboliths in the pelvis and lower abdomen. MUSCULOSKELETAL: Anterior abdominal wall scarring. Mild bilateral hip osteoarthrosis. Grade 1 L4-5 anterolisthesis on degenerative basis. Severe lower lumbar facet arthropathy. Severe RIGHT L4-5 neural foraminal narrowing. IMPRESSION: 1. Partial small bowel obstruction, transition point within umbilical hernia. No CT findings of strangulation. 2. Small fat containing ventral hernia with fat necrosis. Aortic Atherosclerosis (ICD10-I70.0). Electronically Signed   By: Elon Alas M.D.   On: 12/09/2016 05:48   Dg Abdomen Acute W/chest  Result Date: 12/09/2016 CLINICAL DATA:  Vomiting and mid abdominal pain for 1 day. EXAM: DG ABDOMEN ACUTE W/ 1V CHEST COMPARISON:  Chest radiograph March 19, 2016 FINDINGS: Cardiomediastinal silhouette is normal. Lungs are clear, no pleural effusions. No pneumothorax. Soft tissue planes and included osseous structures are unremarkable. Multiple loops of gas distended small bowel in central abdomen measuring to at least 3 cm. Scattered air-fluid levels. Small amount of large bowel gas. Moderate amount of stool projecting at the rectum. Densities projecting in the pelvis may be related to diverticular disease. No intra-abdominal mass effect, pathologic calcifications or free air. Surgical clips in the included right abdomen compatible with cholecystectomy. Soft tissue planes and included osseous structures are non-suspicious. IMPRESSION: Negative chest. Early versus partial suspected small bowel obstruction. Electronically Signed   By: Elon Alas M.D.   On: 12/09/2016 05:34    Review of Systems  Constitutional: Negative for chills and fever.  Respiratory: Negative.   Cardiovascular: Negative for chest pain, palpitations and orthopnea.  Gastrointestinal: Positive for abdominal pain, nausea and vomiting. Negative for constipation.  Musculoskeletal: Positive for back pain and joint pain.    Blood  pressure 113/69, pulse 74, temperature 98.5 F (36.9 C), temperature source Oral, resp. rate 19, height _0  (1.575 m), weight 85.3 kg (188 lb), SpO2 98 %. Physical Exam  General: Alert, obese African-American female, in no distress Skin: Warm and dry without rash or infection. HEENT: No palpable masses or thyromegaly. Sclera nonicteric. Pupils equal round and reactive.  Lymph nodes: No cervical, supraclavicular, or inguinal nodes palpable. Lungs: Breath sounds clear and equal without increased work of breathing Cardiovascular: Regular rate and rhythm without murmur. No JVD or edema. Peripheral pulses intact. Abdomen: Obese without apparent distention. Generally soft and nontender. There is  some tenderness around the umbilicus with deep palpation but no guarding. I cannot feel a definite mass but abdomen is obese. Healed low midline and laparoscopic incisions. Extremities: No edema or joint swelling or deformity. No chronic venous stasis changes. Neurologic: Alert and fully oriented. Affect normal. No gross motor deficits.  Assessment/Plan Acute abdominal pain with nausea and vomiting and CT showing an incarcerated periumbilical incisional hernia with partial small bowel obstruction. I discussed the findings with the patient and her daughter. She will require emergency repair. I recommended laparoscopic-assisted repair. We discussed the procedure in detail including its nature and indication, expected recovery, use of mesh, risks of bleeding, infection, anesthetic complications, cardiorespiratory complications or injury to surrounding structures. She understands and agrees to proceed. History of chest pain and negative cardiac workup, negative stress test Multiple sclerosis with minimal symptoms currently on treatment Obesity Spinal stenosis and chronic pain Hypertension Hyperlipidemia Fibromyalgia  Jayelyn Barno T, MD 12/09/2016, 7:46 AM

## 2016-12-09 NOTE — ED Triage Notes (Signed)
Pt states she is having abd pain with nausea and vomiting that started last night about 10pm  Pt states she started a new diet yesterday   Pt having active vomiting in triage

## 2016-12-09 NOTE — ED Notes (Signed)
Pt actively vomiting; [place] MD and [place] PA notified face to face interaction.

## 2016-12-09 NOTE — Anesthesia Postprocedure Evaluation (Signed)
Anesthesia Post Note  Patient: Andrea Cantrell  Procedure(s) Performed: LAPAROSCOPIC INCISIONAL HERNIA REPAIR WITH MESH (N/A Abdomen)     Patient location during evaluation: PACU Anesthesia Type: General Level of consciousness: awake and alert, patient cooperative and oriented Pain management: pain level controlled (pain improving) Vital Signs Assessment: post-procedure vital signs reviewed and stable Respiratory status: spontaneous breathing, nonlabored ventilation, respiratory function stable and patient connected to nasal cannula oxygen Cardiovascular status: blood pressure returned to baseline and stable Postop Assessment: no apparent nausea or vomiting Anesthetic complications: no    Last Vitals:  Vitals:   12/09/16 1810 12/09/16 1815  BP:  104/71  Pulse: 87 92  Resp: 18 (!) 25  Temp:  37 C  SpO2: 94% 93%    Last Pain:  Vitals:   12/09/16 1815  TempSrc:   PainSc: 5                  Narissa Beaufort,E. Jaree Trinka

## 2016-12-09 NOTE — ED Provider Notes (Signed)
[facility] DEPT Provider Note   CSN: [id number] Arrival date & time: 12/09/16  0630     History   Chief Complaint Chief Complaint  Patient presents with  . Abdominal Pain  . Emesis    HPI Andrea Cantrell is a 66 y.o. female with a hx of good lupus, hypertension, fibromyalgia, hyperlipidemia, multiple sclerosis, spinal stenosis presents to the Emergency Department complaining of gradual, persistent, progressively worsening generalized and epigastric abdominal pain onset around 10 PM tonight.  She reports this has been associated with persistent nausea and vomiting.  She reports vomiting of stomach contents which has been nonbloody and nonbilious.  Patient reports that she is a smoker but does not drink alcohol.  She states that yesterday was the first day of her "clean eating" diet.  She denies any diet supplements including supplements to assist with weight loss.  Patient reports eating fruit, broccoli and salad yesterday.  Patient denies melena or hematochezia.  Nothing seems to make her symptoms better or worse.  She denies fevers or chills, headache, neck pain, weakness, dizziness, syncope.  Patient does state that the abdominal pain radiates into her chest.     The history is provided by the patient, a relative and medical records. No language interpreter was used.    Past Medical History:  Diagnosis Date  . Discoid lupus   . Essential hypertension   . Fibromyalgia   . Heavy smoker (more than 20 cigarettes per day)    Was able to stop for 11 years but restarted about 10 years ago  . Hyperlipidemia with target LDL less than 130   . MS (multiple sclerosis) ([place])   . Obesity (BMI 35.0-39.9 without comorbidity)   . Spinal stenosis     Patient Active Problem List   Diagnosis Date Noted  . Tobacco abuse counseling 06/19/2016  . Aortic systolic murmur on examination 06/17/2016  . Lumbar radiculopathy 04/17/2014  . Dizziness 04/13/2014  . Acute  bronchitis 04/13/2014  . Hypoxia 04/12/2014  . Left-sided low back pain with left-sided sciatica 03/26/2014  . DOE (dyspnea on exertion) 11/20/2013  . Essential hypertension   . Hyperlipidemia with target LDL less than 130   . Obesity (BMI 35.0-39.9 without comorbidity) ([place])   . Multiple sclerosis ([place]) 07/26/2012  . Other nonspecific abnormal result of function study of brain and central nervous system 07/26/2012  . Disturbance of skin sensation 07/26/2012    Past Surgical History:  Procedure Laterality Date  . ABDOMINAL HYSTERECTOMY  1995  . BREAST SURGERY Left    tissue removal  . [place]  . CHOLECYSTECTOMY  1994  . NM MYOVIEW LTD  October 2015   EF 71%. No ischemia or infarction. Low risk/normal  . ROTATOR CUFF REPAIR Left June 2016    OB History    No data available       Home Medications    Prior to Admission medications   Medication Sig Start Date End Date Taking? Authorizing Provider  albuterol (PROVENTIL HFA;VENTOLIN HFA) 108 (90 BASE) MCG/ACT inhaler Inhale 2 puffs into the lungs every 6 (six) hours as needed for wheezing or shortness of breath (wheezing and shortness of breath). For shortness of breath.   Yes [provider]  amLODipine (NORVASC) 5 MG tablet Take 5 mg by mouth every morning.    Yes [provider]  aspirin EC 81 MG tablet Take 162 mg by mouth every evening.    Yes [provider]  atorvastatin (LIPITOR) 10 MG tablet Take 10 mg by mouth every morning.    Yes [provider]  baclofen (LIORESAL) 10 MG tablet Take 1 tablet (10 mg total) by mouth 3 (three) times daily. 04/09/16  Yes Ward Givens, NP  Dimethyl Fumarate (TECFIDERA) 240 MG CPDR TAKE ONE CAPSULE (240 MG) BY MOUTH TWO TIMES DAILY . STORE AT ROOM TEMP IN THE ORIGINAL CONTAINER. DO NOT CRUSH OR CHEW. SWALLOW WHOLE. 04/09/16  Yes Ward Givens, NP  DULoxetine (CYMBALTA) 60 MG capsule Take 1 capsule (60 mg total) by mouth daily.  10/07/16  Yes Marcial Pacas, MD  gabapentin (NEURONTIN) 800 MG tablet Take 1 tablet (800 mg total) by mouth 3 (three) times daily. 10/07/16  Yes Marcial Pacas, MD  ibuprofen (ADVIL,MOTRIN) 200 MG tablet Take 600 mg by mouth every 6 (six) hours as needed for moderate pain.   Yes [provider]  traMADol (ULTRAM) 50 MG tablet Take 1 tablet (50 mg total) by mouth every 6 (six) hours as needed. Patient taking differently: Take 50 mg by mouth every 6 (six) hours as needed for moderate pain.  10/07/16  Yes Marcial Pacas, MD  VITAMIN D, CHOLECALCIFEROL, PO Take 10,000 Units by mouth daily.    Yes [provider]    Family History Family History  Problem Relation Age of Onset  . Cancer Mother   . Cancer Father   . Diabetes Brother   . Diabetes Maternal Grandmother     Social History Social History  Substance Use Topics  . Smoking status: Current Every Day Smoker    Packs/day: 1.00    Types: Cigarettes  . Smokeless tobacco: Never Used  . Alcohol use No     Allergies   Patient has no known allergies.   Review of Systems Review of Systems  Constitutional: Negative for appetite change, diaphoresis, fatigue, fever and unexpected weight change.  HENT: Negative for mouth sores.   Eyes: Negative for visual disturbance.  Respiratory: Negative for cough, chest tightness, shortness of breath and wheezing.   Cardiovascular: Negative for chest pain.  Gastrointestinal: Positive for abdominal pain and vomiting. Negative for constipation, diarrhea and nausea.  Endocrine: Negative for polydipsia, polyphagia and polyuria.  Genitourinary: Negative for dysuria, frequency, hematuria and urgency.  Musculoskeletal: Negative for back pain and neck stiffness.  Skin: Negative for rash.  Allergic/Immunologic: Negative for immunocompromised state.  Neurological: Negative for syncope, light-headedness and headaches.  Hematological: Does not bruise/bleed easily.  Psychiatric/Behavioral: Negative for  sleep disturbance. The patient is not nervous/anxious.      Physical Exam Updated Vital Signs BP (!) 176/87 (BP Location: Left Arm)   Pulse (!) 112   Temp 98.5 F (36.9 C) (Oral)   Resp 20   Ht 5\' 2"  (1.575 m)   Wt 85.3 kg (188 lb)   SpO2 99%   BMI 34.39 kg/m   Physical Exam  Constitutional: She appears well-developed and well-nourished. She appears distressed.  Awake, alert, nontoxic appearance Patient appears uncomfortable, actively vomiting  HENT:  Head: Normocephalic and atraumatic.  Mouth/Throat: Oropharynx is clear and moist. No oropharyngeal exudate.  Eyes: Conjunctivae are normal. No scleral icterus.  Neck: Normal range of motion. Neck supple.  Cardiovascular: Normal rate, regular rhythm and intact distal pulses.   Pulmonary/Chest: Effort normal and breath sounds normal. No respiratory distress. She has no wheezes.  Equal chest expansion  Abdominal: Soft. Bowel sounds are normal. She exhibits no mass. There is generalized tenderness. There is guarding. There is no rigidity, no rebound  and no CVA tenderness.  Tenderness to palpation throughout but worse in the epigastrium and left lower quadrant with mild guarding   Musculoskeletal: Normal range of motion. She exhibits no edema.  Neurological: She is alert.  Speech is clear and goal oriented Moves extremities without ataxia  Skin: Skin is warm and dry. She is not diaphoretic.  Psychiatric: She has a normal mood and affect.  Nursing note and vitals reviewed.    ED Treatments / Results  Labs (all labs ordered are listed, but only abnormal results are displayed) Labs Reviewed  COMPREHENSIVE METABOLIC PANEL - Abnormal; Notable for the following:       Result Value   Chloride 100 (*)    Glucose, Bld 150 (*)    Total Protein 8.5 (*)    All other components within normal limits  CBC - Abnormal; Notable for the following:    WBC 12.8 (*)    RBC 5.25 (*)    MCV 76.4 (*)    MCH 25.3 (*)    RDW 17.1 (*)     Platelets 520 (*)    All other components within normal limits  LIPASE, BLOOD  TROPONIN I  URINALYSIS, ROUTINE W REFLEX MICROSCOPIC    EKG  EKG Interpretation  Date/Time:  Wednesday December 09 2016 04:33:49 EDT Ventricular Rate:  78 PR Interval:    QRS Duration: 85 QT Interval:  375 QTC Calculation: 428 R Axis:   8 Text Interpretation:  Sinus rhythm Confirmed by Dory Horn) on 12/09/2016 4:39:18 AM       Radiology Ct Abdomen Pelvis W Contrast  Result Date: 12/09/2016 CLINICAL DATA:  Abdominal pain, nausea and vomiting, abdominal distension. Recent UTI. History of cholecystectomy, hysterectomy coma lupus. EXAM: CT ABDOMEN AND PELVIS WITH CONTRAST TECHNIQUE: Multidetector CT imaging of the abdomen and pelvis was performed using the standard protocol following bolus administration of intravenous contrast. CONTRAST:  100 cc Isovue-300 COMPARISON:  Acute abdominal series December 09, 2016 at 0508 hours FINDINGS: LOWER CHEST: Lung bases are clear. Included heart size is normal. No pericardial effusion. HEPATOBILIARY: Status post cholecystectomy. Mild postprocedural intrahepatic biliary dilatation. Focal fatty infiltration about the falciform ligament. PANCREAS: Normal. SPLEEN: Normal. ADRENALS/URINARY TRACT: Kidneys are orthotopic, demonstrating symmetric enhancement. No nephrolithiasis, hydronephrosis or solid renal masses. Too small to characterize hypodensity upper pole RIGHT kidney. The unopacified ureters are normal in course and caliber. Delayed imaging through the kidneys demonstrates symmetric prompt contrast excretion within the proximal urinary collecting system. Urinary bladder is partially distended and unremarkable. Normal adrenal glands. STOMACH/BOWEL: Small hiatal hernia. Dilated fluid-filled small bowel to 3 cm, mild transition point/peristalsis RIGHT lower quadrant. Loop of small bowel courses into umbilical hernia, 2.4 cm neck, associated with small bowel transition  point. No pneumatosis. No inflammatory changes or fluid within the hernia sac. Distal small bowel feces. Mild colonic diverticulosis. VASCULAR/LYMPHATIC: Aortoiliac vessels are normal in course and caliber. Mild calcific atherosclerosis. No lymphadenopathy by CT size criteria. REPRODUCTIVE: Status post hysterectomy. OTHER: No intraperitoneal free fluid or free air. Fat containing LEFT inguinal hernia. Small fat containing LEFT ventral infraumbilical hernia with inflammatory changes. Phleboliths in the pelvis and lower abdomen. MUSCULOSKELETAL: Anterior abdominal wall scarring. Mild bilateral hip osteoarthrosis. Grade 1 L4-5 anterolisthesis on degenerative basis. Severe lower lumbar facet arthropathy. Severe RIGHT L4-5 neural foraminal narrowing. IMPRESSION: 1. Partial small bowel obstruction, transition point within umbilical hernia. No CT findings of strangulation. 2. Small fat containing ventral hernia with fat necrosis. Aortic Atherosclerosis (ICD10-I70.0). Electronically Signed   By: Sandie Ano  Bloomer M.D.   On: 12/09/2016 05:48   Dg Abdomen Acute W/chest  Result Date: 12/09/2016 CLINICAL DATA:  Vomiting and mid abdominal pain for 1 day. EXAM: DG ABDOMEN ACUTE W/ 1V CHEST COMPARISON:  Chest radiograph March 19, 2016 FINDINGS: Cardiomediastinal silhouette is normal. Lungs are clear, no pleural effusions. No pneumothorax. Soft tissue planes and included osseous structures are unremarkable. Multiple loops of gas distended small bowel in central abdomen measuring to at least 3 cm. Scattered air-fluid levels. Small amount of large bowel gas. Moderate amount of stool projecting at the rectum. Densities projecting in the pelvis may be related to diverticular disease. No intra-abdominal mass effect, pathologic calcifications or free air. Surgical clips in the included right abdomen compatible with cholecystectomy. Soft tissue planes and included osseous structures are non-suspicious. IMPRESSION: Negative chest.  Early versus partial suspected small bowel obstruction. Electronically Signed   By: Elon Alas M.D.   On: 12/09/2016 05:34    Procedures Procedures (including critical care time)  Medications Ordered in ED Medications  ondansetron (ZOFRAN) injection 4 mg (4 mg Intravenous Given 12/09/16 0347)  sodium chloride 0.9 % bolus 1,000 mL (0 mLs Intravenous Stopped 12/09/16 0517)  morphine 4 MG/ML injection 4 mg (4 mg Intravenous Given 12/09/16 0417)  iopamidol (ISOVUE-300) 61 % injection 100 mL (100 mLs Intravenous Contrast Given 12/09/16 0512)     Initial Impression / Assessment and Plan / ED Course  I have reviewed the triage vital signs and the nursing notes.  Pertinent labs & imaging results that were available during my care of the patient were reviewed by me and considered in my medical decision making (see chart for details).  Clinical Course as of Dec 09 641  Wed Dec 09, 2016  0643 Discussed with Dr. Donne Hazel who will have Dr. Excell Seltzer evaluate this mornnig  [HM]    Clinical Course User Index [HM] Jaivon Vanbeek, Jarrett [place], [place]    Patient presents to the emergency department with persistent abdominal pain radiating into her chest, nausea and vomiting.  Patient with leukocytosis.  No anemia.  Normal lipase, no elevation in AST/ALT or total bilirubin.  Negative troponin.  EKG reassuring.   CT scan shows partial small bowel obstruction.  NG tube to be placed.  Will discuss with surgery.  Patient will need admission.  Discussed this with patient and her daughter who are in agreement with the plan.    Final Clinical Impressions(s) / ED Diagnoses   Final diagnoses:  Patient has nasogastric tube  Umbilical hernia with obstruction, without gangrene  Partial intestinal obstruction, unspecified cause [facility])    New Prescriptions New Prescriptions   No medications on file     Agapito Games 12/09/16 [place], April, MD 12/09/16 [phone]

## 2016-12-09 NOTE — Transfer of Care (Signed)
Immediate Anesthesia Transfer of Care Note  Patient: Andrea Cantrell  Procedure(s) Performed: LAPAROSCOPIC INCISIONAL HERNIA REPAIR WITH MESH (N/A Abdomen)  Patient Location: PACU  Anesthesia Type:General  Level of Consciousness: awake and oriented  Airway & Oxygen Therapy: Patient Spontanous Breathing and Patient connected to face mask oxygen  Post-op Assessment: Report given to RN and Post -op Vital signs reviewed and stable  Post vital signs: Reviewed and stable  Last Vitals:  Vitals:   12/09/16 0830 12/09/16 1000  BP:  114/72  Pulse: 88 89  Resp: (!) 21 19  Temp:    SpO2: 91% 93%    Last Pain:  Vitals:   12/09/16 1328  TempSrc:   PainSc: 1       Patients Stated Pain Goal: 3 (43/83/81 8403)  Complications: No apparent anesthesia complications

## 2016-12-09 NOTE — Op Note (Signed)
Preoperative Diagnosis: Incarcerated ventral incisional hernia with small bowel obstruction  Postoprative Diagnosis: Same  Procedure: Procedure(s): LAPAROSCOPIC INCISIONAL HERNIA REPAIR WITH MESH   Surgeon: Excell Seltzer T   Assistants: None  Anesthesia:  General endotracheal anesthesia  Indications: Patient is a 66 year old female with obesity and previous history of hysterectomy who presents with acute abdominal pain and CT scan showing partial small bowel obstruction with a loop of small intestine through a small lower midline incisional hernia. With these findings I have recommended urgent combined laparoscopic and open repair with mesh. I discussed the procedure and indications and risks with the patient in detail documented elsewhere and she understands and agrees to proceed.    Procedure Detail:  Patient was brought to the operating room, placed in supine position on the operating table, and general endotracheal anesthesia induced. Foley catheter was placed. She was given preoperative IV antibiotics. The abdomen was widely sterilely prepped and draped. PAS were in place. Patient timeout was performed and correct procedure verified. Access was obtained with a 5 mm Optiview trocar in the left upper quadrant without difficulty and pneumoperitoneum established. There were fairly extensive mostly omental adhesions to the midline and up as high as the falciform ligament. I was able to place 25 mm trochars laterally in the lower left abdomen under direct vision. Using sharp and blunt and harmonic scalpel dissection omental adhesions were taken down off the anterior abdominal wall working down toward the lower midline. There was one loop of small bowel up partially into a small lower midline hernia about halfway between the umbilicus and the pubis. It did not appear to be acutely obstructed and appeared likely to have reduced at some point since admission. The adhesions around the edge of the  hernia defect of the bowel were filmy and I was able to take them down sharply under direct vision without any evidence of injury to the bowel. A few other adhesions to small bowel along the right lower abdomen were taken down easily. I then ran the small bowel distally and it remained decompressed and the short distance from the ileocecal valve. I ran the bowel proximally and it gradually tapered to moderately distended bowel after about 20 or 30 cm I was able to run this all the way up toward the ligament of Treitz which point it became more normal caliber and there are certainly no other significant adhesion or point of obstruction and I think the hernia was the point of obstruction which had spontaneously reduced. The hernia defect actually measured only 2.5 cm in diameter. I chose to repair this with a laparoscopically placed 11 cm disc of coated Prolene mesh. 4-0 Vicryl stay sutures were placed circumferentially around the mesh. I then made an incision about 4-5 cm in length directly over the hernia dissected down to the subcutaneous tissue. The hernia sac was completely dissected away from subcutaneous tissue and excised at the level of the fascia. The mesh was introduced into the abdominal cavity and the fascial defect was closed with interrupted #1 Novafil sutures. The mesh was then oriented and the 4 stay sutures retrieved through the anterior abdominal wall through previously placed small stab incisions and the mesh brought up with nice deployment surrounding the repaired hernia defect. The sutures were secured and then the mesh was additionally secured circumferentially and closer to the center as well with concentric tacks using the [place]. The abdomen was carefully inspected for injury or bleeding and everything looked fine. All CO2 was evacuated and  trochars removed. A small midline incision was closed in layers with running subcutaneous 3-0 Vicryl and staples. Other laparoscopic incisions were  closed with Dermabond. Sponge and needle counts were correct.    Findings: As above  Estimated Blood Loss:  Minimal         Drains: None  Blood Given: none          Specimens: None        Complications:  * No complications entered in OR log *         Disposition: PACU - hemodynamically stable.         Condition: stable

## 2016-12-09 NOTE — Anesthesia Preprocedure Evaluation (Signed)
Anesthesia Evaluation  Patient identified by MRN, date of birth, ID band Patient awake    Reviewed: Allergy & Precautions, NPO status , Patient's Chart, lab work & pertinent test results  Airway Mallampati: II  TM Distance: >3 FB Neck ROM: Full    Dental no notable dental hx.    Pulmonary neg pulmonary ROS, Current Smoker,    Pulmonary exam normal breath sounds clear to auscultation       Cardiovascular hypertension, Pt. on medications Normal cardiovascular exam Rhythm:Regular Rate:Normal     Neuro/Psych negative neurological ROS  negative psych ROS   GI/Hepatic negative GI ROS, Neg liver ROS,   Endo/Other  negative endocrine ROS  Renal/GU negative Renal ROS  negative genitourinary   Musculoskeletal  (+) Fibromyalgia -Lupus MS   Abdominal   Peds negative pediatric ROS (+)  Hematology negative hematology ROS (+)   Anesthesia Other Findings   Reproductive/Obstetrics negative OB ROS                             Anesthesia Physical Anesthesia Plan  ASA: III  Anesthesia Plan: General   Post-op Pain Management:    Induction: Intravenous, Rapid sequence and Cricoid pressure planned  PONV Risk Score and Plan: 2 and Ondansetron, Treatment may vary due to age or medical condition and Dexamethasone  Airway Management Planned: Oral ETT  Additional Equipment:   Intra-op Plan:   Post-operative Plan: Extubation in OR  Informed Consent: I have reviewed the patients History and Physical, chart, labs and discussed the procedure including the risks, benefits and alternatives for the proposed anesthesia with the patient or authorized representative who has indicated his/her understanding and acceptance.   Dental advisory given  Plan Discussed with: CRNA  Anesthesia Plan Comments:         Anesthesia Quick Evaluation

## 2016-12-09 NOTE — ED Notes (Signed)
Pt unable to give urine sample, but aware that we need one.  

## 2016-12-10 ENCOUNTER — Encounter (HOSPITAL_COMMUNITY): Payer: Self-pay | Admitting: General Surgery

## 2016-12-10 MED ORDER — AMLODIPINE BESYLATE 5 MG PO TABS
5.0000 mg | ORAL_TABLET | Freq: Every morning | ORAL | Status: DC
  Administered 2016-12-10 – 2016-12-13 (×4): 5 mg via ORAL
  Filled 2016-12-10 (×4): qty 1

## 2016-12-10 MED ORDER — OXYCODONE HCL 5 MG PO TABS
5.0000 mg | ORAL_TABLET | ORAL | Status: DC | PRN
  Administered 2016-12-10: 5 mg via ORAL
  Administered 2016-12-10 – 2016-12-11 (×3): 10 mg via ORAL
  Filled 2016-12-10: qty 2
  Filled 2016-12-10: qty 1
  Filled 2016-12-10 (×2): qty 2

## 2016-12-10 MED ORDER — DULOXETINE HCL 60 MG PO CPEP
60.0000 mg | ORAL_CAPSULE | Freq: Every day | ORAL | Status: DC
  Administered 2016-12-10 – 2016-12-13 (×4): 60 mg via ORAL
  Filled 2016-12-10 (×4): qty 1

## 2016-12-10 MED ORDER — ENOXAPARIN SODIUM 40 MG/0.4ML ~~LOC~~ SOLN
40.0000 mg | SUBCUTANEOUS | Status: DC
  Administered 2016-12-10 – 2016-12-12 (×3): 40 mg via SUBCUTANEOUS
  Filled 2016-12-10 (×3): qty 0.4

## 2016-12-10 MED ORDER — GABAPENTIN 400 MG PO CAPS
800.0000 mg | ORAL_CAPSULE | Freq: Three times a day (TID) | ORAL | Status: DC
  Administered 2016-12-10 – 2016-12-13 (×8): 800 mg via ORAL
  Filled 2016-12-10 (×9): qty 2

## 2016-12-10 MED ORDER — GABAPENTIN 400 MG PO CAPS
800.0000 mg | ORAL_CAPSULE | Freq: Three times a day (TID) | ORAL | Status: DC
  Administered 2016-12-10: 800 mg via ORAL
  Filled 2016-12-10 (×2): qty 2

## 2016-12-10 MED ORDER — DIMETHYL FUMARATE 240 MG PO CPDR
240.0000 mg | DELAYED_RELEASE_CAPSULE | Freq: Two times a day (BID) | ORAL | Status: DC
  Administered 2016-12-10 – 2016-12-13 (×6): 240 mg via ORAL
  Filled 2016-12-10 (×6): qty 1

## 2016-12-10 MED ORDER — ACETAMINOPHEN 500 MG PO TABS
1000.0000 mg | ORAL_TABLET | Freq: Three times a day (TID) | ORAL | Status: DC
  Administered 2016-12-10 – 2016-12-12 (×7): 1000 mg via ORAL
  Filled 2016-12-10 (×8): qty 2

## 2016-12-10 NOTE — Progress Notes (Signed)
Patient up in chair, NG removed without difficulty. Denies N/V, no abdominal distension noted.

## 2016-12-10 NOTE — Progress Notes (Signed)
1 Day Post-Op    CC:  Incarcerated hernia  Subjective: She is doing well this a.m.  Still pretty sore, NG in place.  She has been up and walked.  She has a binder on but is actually above the hernia repair.  Port sites all look good.  Objective: Vital signs in last 24 hours: Temp:  [98.1 F (36.7 C)-98.8 F (37.1 C)] 98.2 F (36.8 C) (10/25 1000) Pulse Rate:  [77-97] 77 (10/25 1000) Resp:  [16-25] 18 (10/25 1000) BP: (104-138)/(66-72) 122/70 (10/25 1000) SpO2:  [91 %-98 %] 95 % (10/25 1000)  NPO 1700 IV 400 urine NG 250  Afebrile, VSS  Intake/Output from previous day: 10/24 0701 - 10/25 0700 In: 1700 [I.V.:1700] Out: 675 [Urine:400; Emesis/NG output:250; Blood:25] Intake/Output this shift: Total I/O In: -  Out: 50 [Emesis/NG output:50]  General appearance: alert, cooperative and no distress Resp: clear to auscultation bilaterally GI: Soft, sore, some flatus.  NG drainage is rather dark.  Much drainage.  Lab Results:   Recent Labs  12/09/16 0340  WBC 12.8*  HGB 13.3  HCT 40.1  PLT 520*    BMET  Recent Labs  12/09/16 0340  NA 139  K 3.9  CL 100*  CO2 24  GLUCOSE 150*  BUN 11  CREATININE 0.79  CALCIUM 9.9   PT/INR No results for input(s): LABPROT, INR in the last 72 hours.   Recent Labs Lab 12/09/16 0340  AST 22  ALT 16  ALKPHOS 118  BILITOT 0.9  PROT 8.5*  ALBUMIN 4.5     Lipase     Component Value Date/Time   LIPASE 28 12/09/2016 0340   Prior to Admission medications   Medication Sig Start Date End Date Taking? Authorizing Provider  albuterol (PROVENTIL HFA;VENTOLIN HFA) 108 (90 BASE) MCG/ACT inhaler Inhale 2 puffs into the lungs every 6 (six) hours as needed for wheezing or shortness of breath (wheezing and shortness of breath). For shortness of breath.   Yes [provider]  amLODipine (NORVASC) 5 MG tablet Take 5 mg by mouth every morning.    Yes [provider]  aspirin EC 81 MG tablet Take 162 mg by mouth  every evening.    Yes [provider]  atorvastatin (LIPITOR) 10 MG tablet Take 10 mg by mouth every morning.    Yes [provider]  baclofen (LIORESAL) 10 MG tablet Take 1 tablet (10 mg total) by mouth 3 (three) times daily. 04/09/16  Yes Ward Givens, NP  Dimethyl Fumarate (TECFIDERA) 240 MG CPDR TAKE ONE CAPSULE (240 MG) BY MOUTH TWO TIMES DAILY . STORE AT ROOM TEMP IN THE ORIGINAL CONTAINER. DO NOT CRUSH OR CHEW. SWALLOW WHOLE. 04/09/16  Yes Ward Givens, NP  DULoxetine (CYMBALTA) 60 MG capsule Take 1 capsule (60 mg total) by mouth daily. 10/07/16  Yes Marcial Pacas, MD  gabapentin (NEURONTIN) 800 MG tablet Take 1 tablet (800 mg total) by mouth 3 (three) times daily. 10/07/16  Yes Marcial Pacas, MD  ibuprofen (ADVIL,MOTRIN) 200 MG tablet Take 600 mg by mouth every 6 (six) hours as needed for moderate pain.   Yes [provider]  traMADol (ULTRAM) 50 MG tablet Take 1 tablet (50 mg total) by mouth every 6 (six) hours as needed. Patient taking differently: Take 50 mg by mouth every 6 (six) hours as needed for moderate pain.  10/07/16  Yes Marcial Pacas, MD  VITAMIN D, CHOLECALCIFEROL, PO Take 10,000 Units by mouth daily.    Yes [provider]  Medications:   . lactated ringers 100 mL/hr (12/09/16 2246)   Anti-infectives    Start     Dose/Rate Route Frequency Ordered Stop   12/09/16 1239  ceFAZolin (ANCEF) 2-4 GM/100ML-% IVPB    Comments:  Marchia Meiers   : cabinet override      12/09/16 1239 12/09/16 1457   12/09/16 1234  ceFAZolin (ANCEF) IVPB 2g/100 mL premix     2 g 200 mL/hr over 30 Minutes Intravenous 60 min pre-op 12/09/16 1234 12/09/16 1457     Assessment/Plan Incarcerated ventral incisional hernia with small bowel obstruction S/p LAPAROSCOPIC INCISIONAL HERNIA REPAIR WITH MESH, 12/10/16, Dr. Excell Seltzer  POD1 Discoid lupus Hypertension Multiple sclerosis Fibromyalgia History of spinal stenosis History of tobacco use FEN:  IV fluids/NPO ID:   Pre op DVT:  SCD  Plan: Clamp NG, and aim to remove later, start her on sips and chips.  Mobilize.  Her binder needs to be over her lower abdomen it tends to slide up.  Restart Home meds  For MS and fibromyalgia.  LOS: 1 day    Andrea Cantrell 12/10/2016 [phone]

## 2016-12-11 LAB — CBC
HEMATOCRIT: 31.9 % — AB (ref 36.0–46.0)
HEMOGLOBIN: 10.2 g/dL — AB (ref 12.0–15.0)
MCH: 25.1 pg — ABNORMAL LOW (ref 26.0–34.0)
MCHC: 32 g/dL (ref 30.0–36.0)
MCV: 78.4 fL (ref 78.0–100.0)
Platelets: 366 10*3/uL (ref 150–400)
RBC: 4.07 MIL/uL (ref 3.87–5.11)
RDW: 17.5 % — ABNORMAL HIGH (ref 11.5–15.5)
WBC: 6.6 10*3/uL (ref 4.0–10.5)

## 2016-12-11 LAB — BASIC METABOLIC PANEL
ANION GAP: 8 (ref 5–15)
BUN: 8 mg/dL (ref 6–20)
CHLORIDE: 101 mmol/L (ref 101–111)
CO2: 30 mmol/L (ref 22–32)
Calcium: 8.4 mg/dL — ABNORMAL LOW (ref 8.9–10.3)
Creatinine, Ser: 0.55 mg/dL (ref 0.44–1.00)
GFR calc Af Amer: >60 mL/min (ref >60)
GFR calc non Af Amer: >60 mL/min (ref >60)
Glucose, Bld: 91 mg/dL (ref 65–99)
Potassium: 3.6 mmol/L (ref 3.5–5.1)
Sodium: 139 mmol/L (ref 135–145)

## 2016-12-11 MED ORDER — IBUPROFEN 200 MG PO TABS
600.0000 mg | ORAL_TABLET | Freq: Four times a day (QID) | ORAL | Status: DC | PRN
  Administered 2016-12-13: 600 mg via ORAL
  Filled 2016-12-11: qty 3

## 2016-12-11 MED ORDER — BACLOFEN 10 MG PO TABS
10.0000 mg | ORAL_TABLET | Freq: Three times a day (TID) | ORAL | Status: DC
  Administered 2016-12-11 – 2016-12-13 (×6): 10 mg via ORAL
  Filled 2016-12-11 (×6): qty 1

## 2016-12-11 MED ORDER — POLYETHYLENE GLYCOL 3350 17 G PO PACK: PACK | ORAL | 0 refills | Status: AC

## 2016-12-11 MED ORDER — OXYCODONE HCL 5 MG PO TABS: 5.0000 mg | ORAL_TABLET | ORAL | 0 refills | Status: DC | PRN

## 2016-12-11 MED ORDER — VITAMIN D 1000 UNITS PO TABS
10000.0000 [IU] | ORAL_TABLET | Freq: Every day | ORAL | Status: DC
  Administered 2016-12-11 – 2016-12-12 (×2): 10000 [IU] via ORAL
  Filled 2016-12-11 (×2): qty 10

## 2016-12-11 MED ORDER — ATORVASTATIN CALCIUM 10 MG PO TABS
10.0000 mg | ORAL_TABLET | Freq: Every morning | ORAL | Status: DC
  Administered 2016-12-11 – 2016-12-13 (×3): 10 mg via ORAL
  Filled 2016-12-11 (×3): qty 1

## 2016-12-11 MED ORDER — TRAMADOL HCL 50 MG PO TABS
50.0000 mg | ORAL_TABLET | Freq: Four times a day (QID) | ORAL | Status: DC | PRN
  Filled 2016-12-11: qty 1

## 2016-12-11 MED ORDER — ASPIRIN EC 81 MG PO TBEC
162.0000 mg | DELAYED_RELEASE_TABLET | Freq: Every evening | ORAL | Status: DC
  Administered 2016-12-11 – 2016-12-12 (×2): 162 mg via ORAL
  Filled 2016-12-11 (×3): qty 2

## 2016-12-11 MED ORDER — POLYETHYLENE GLYCOL 3350 17 G PO PACK
17.0000 g | PACK | Freq: Every day | ORAL | Status: DC
  Administered 2016-12-11 – 2016-12-13 (×3): 17 g via ORAL
  Filled 2016-12-11 (×3): qty 1

## 2016-12-11 NOTE — Discharge Instructions (Signed)
CC[facility] _______[facility] ° °UMBILICAL OR INGUINAL HERNIA REPAIR: POST OP INSTRUCTIONS ° °Always review your discharge instruction sheet given to you by the facility where your surgery was performed. °IF YOU HAVE DISABILITY OR FAMILY LEAVE FORMS, YOU MUST BRING THEM TO THE OFFICE FOR PROCESSING.   °DO NOT GIVE THEM TO YOUR DOCTOR. ° °1. A  prescription for pain medication may be given to you upon discharge.  Take your pain medication as prescribed, if needed.  If narcotic pain medicine is not needed, then you may take acetaminophen (Tylenol) or ibuprofen (Advil) as needed. °2. Take your usually prescribed medications unless otherwise directed. °If you need a refill on your pain medication, please contact your pharmacy.  They will contact our office to request authorization. Prescriptions will not be filled after 5 pm or on week-ends. °3. You should follow a light diet the first 24 hours after arrival home, such as soup and crackers, etc.  Be sure to include lots of fluids daily.  Resume your normal diet the day after surgery. °4.Most patients will experience some swelling and bruising around the umbilicus or in the groin and scrotum.  Ice packs and reclining will help.  Swelling and bruising can take several days to resolve.  °6. It is common to experience some constipation if taking pain medication after surgery.  Increasing fluid intake and taking a stool softener (such as Colace) will usually help or prevent this problem from occurring.  A mild laxative (Milk of Magnesia or Miralax) should be taken according to package directions if there are no bowel movements after 48 hours. °7. Unless discharge instructions indicate otherwise, you may remove your bandages 24-48 hours after surgery, and you may shower at that time.  You may have steri-strips (small skin tapes) in place directly over the incision.  These strips should be left on the skin for 7-10 days.  If your surgeon used skin glue on the  incision, you may shower in 24 hours.  The glue will flake off over the next 2-3 weeks.  Any sutures or staples will be removed at the office during your follow-up visit. °8. ACTIVITIES:  You may resume regular (light) daily activities beginning the next day--such as daily self-care, walking, climbing stairs--gradually increasing activities as tolerated.  You may have sexual intercourse when it is comfortable.  Refrain from any heavy lifting or straining until approved by your doctor. ° °a.You may drive when you are no longer taking prescription pain medication, you can comfortably wear a seatbelt, and you can safely maneuver your car and apply brakes. °b.RETURN TO WORK:   °_____________________________________________ ° °9.You should see your doctor in the office for a follow-up appointment approximately 2-3 weeks after your surgery.  Make sure that you call for this appointment within a day or two after you arrive home to insure a convenient appointment time. °10.OTHER INSTRUCTIONS: _________________________ °   _____________________________________ ° °WHEN TO CALL YOUR DOCTOR: °1. Fever over 101.0 °2. Inability to urinate °3. Nausea and/or vomiting °4. Extreme swelling or bruising °5. Continued bleeding from incision. °6. Increased pain, redness, or drainage from the incision ° °The clinic staff is available to answer your questions during regular business hours.  Please don’t hesitate to call and ask to speak to one of the nurses for clinical concerns.  If you have a medical emergency, go to the nearest emergency room or call 911.  A surgeon from [facility] is always on call at the hospital ° ° °  [address], [address], [place], [place]  [address] ? ° [address] [address], [place], [place]   [address] °[phone] ? [phone] ? FAX [phone] °Web site: [email or web address] °

## 2016-12-11 NOTE — Progress Notes (Signed)
2 Days Post-Op    CC: Incarcerated hernia  Subjective: Sore and no BM but no other complaints.  Site looks good.  Up some but not allot.  Objective: Vital signs in last 24 hours: Temp:  [97.9 F (36.6 C)-98.8 F (37.1 C)] 98.8 F (37.1 C) (10/26 0618) Pulse Rate:  [75-82] 78 (10/26 0618) Resp:  [16-18] 16 (10/26 0618) BP: (103-133)/(49-80) 133/80 (10/26 0618) SpO2:  [91 %-94 %] 94 % (10/26 0618)   360 PO 2770 IV 850 urine NG/emesis - 50 Afebrile, VSS Labs OK Intake/Output from previous day: 10/25 0701 - 10/26 0700 In: 3130 [P.O.:360; I.V.:2770] Out: 900 [Urine:850; Emesis/NG output:50] Intake/Output this shift: Total I/O In: -  Out: 300 [Urine:300]  General appearance: alert, cooperative and no distress Resp: clear to auscultation bilaterally GI: soft, sore sites look fine.  tolerating diet.  No BM  Lab Results:   Recent Labs  12/09/16 0340 12/11/16 0507  WBC 12.8* 6.6  HGB 13.3 10.2*  HCT 40.1 31.9*  PLT 520* 366    BMET  Recent Labs  12/09/16 0340 12/11/16 0507  NA 139 139  K 3.9 3.6  CL 100* 101  CO2 24 30  GLUCOSE 150* 91  BUN 11 8  CREATININE 0.79 0.55  CALCIUM 9.9 8.4*   PT/INR No results for input(s): LABPROT, INR in the last 72 hours.   Recent Labs Lab 12/09/16 0340  AST 22  ALT 16  ALKPHOS 118  BILITOT 0.9  PROT 8.5*  ALBUMIN 4.5     Lipase     Component Value Date/Time   LIPASE 28 12/09/2016 0340     Medications: . acetaminophen  1,000 mg Oral Q8H  . amLODipine  5 mg Oral q morning - 10a  . Dimethyl Fumarate  240 mg Oral BID  . DULoxetine  60 mg Oral Daily  . enoxaparin (LOVENOX) injection  40 mg Subcutaneous Q24H  . gabapentin  800 mg Oral TID    Assessment/Plan Incarcerated ventral incisional hernia with small bowel obstruction S/p LAPAROSCOPIC INCISIONAL HERNIA REPAIR WITH MESH, 12/10/16, Dr. Excell Seltzer  POD1 Discoid lupus Hypertension Multiple sclerosis Fibromyalgia - Tramadol History of spinal  stenosis History of tobacco use FEN:  IV fluids/clear liquids ID:  Pre op DVT:  SCD   Plan: Advance her diet saline lock her IV, mobilize further.  Get her back on all her p.o. medicines from before admission.  Home later today or tomorrow when tolerating diet and ambulating safely. I have personally reviewed the patients medication history on the [place] controlled substance database.  LOS: 2 days    Minola Guin 12/11/2016 [phone]

## 2016-12-11 NOTE — Discharge Summary (Signed)
Physician Discharge Summary  Patient ID: Andrea Cantrell MRN: [id number] DOB/AGE: Aug 02, 1950 66 y.o.  Admit date: 12/09/2016 Discharge date: 12/13/2016 Chief complaint:  abdominal pain nausea vomiting   Admission Diagnoses:  Incarcerated periumbilical incisional hernia with partial small bowel obstruction History of chest pain and negative cardiac workup, negative stress test Multiple sclerosis with minimal symptoms currently on treatment Obesity Spinal stenosis and chronic pain Hypertension Hyperlipidemia Fibromyalgia  Discharge Diagnoses:  Incarcerated periumbilical incisional hernia with partial bowel obstruction History of chest pain and negative cardiac workup, negative stress test Multiple sclerosis with minimal symptoms currently on treatment Obesity Spinal stenosis and chronic pain Hypertension Hyperlipidemia Fibromyalgia  Active Problems:   Incarcerated incisional hernia   PROCEDURES: Laparoscopic incisional hernia repair with mesh 12/10/16 Dr. Marland Kitchen [facility] Course: Patient is a pleasant 66 year old female in her usual state of health until about 12 hours prior to admission. At that time she developed the fairly sudden onset of crampy mid abdominal pain followed by frequent nausea and vomiting. The pain intensified and vomiting continued and she presented to the emergency department. Normal bowel movement yesterday. She has not had any recent similar symptoms. She has not been aware of a hernia or bulge in her abdomen. Surgical history significant for previous hysterectomy through low midline incision and laparoscopic cholecystectomy. She was seen in the emergency room and recommended admission along with surgical repair of the incisional hernia.  Patient was agreeable and was admitted taken the operating room later that day.  She underwent the procedure as described above.  NG was removed on the first postoperative day she was started on clear liquids.   Her diet has been advanced.  As she mobilizes and gets back on a regular diet, we have placed her back on her preadmission medicines for the additional issues noted above.  If she does well she should be able to go home in the next 24-48 hours.       Disposition: 01-Home or Self Care   Allergies as of 12/11/2016   No Known Allergies     Medication List    TAKE these medications   albuterol 108 (90 Base) MCG/ACT inhaler Commonly known as:  PROVENTIL HFA;VENTOLIN HFA Inhale 2 puffs into the lungs every 6 (six) hours as needed for wheezing or shortness of breath (wheezing and shortness of breath). For shortness of breath.   amLODipine 5 MG tablet Commonly known as:  NORVASC Take 5 mg by mouth every morning.   aspirin EC 81 MG tablet Take 162 mg by mouth every evening.   atorvastatin 10 MG tablet Commonly known as:  LIPITOR Take 10 mg by mouth every morning.   baclofen 10 MG tablet Commonly known as:  LIORESAL Take 1 tablet (10 mg total) by mouth 3 (three) times daily.   Dimethyl Fumarate 240 MG Cpdr Commonly known as:  TECFIDERA TAKE ONE CAPSULE (240 MG) BY MOUTH TWO TIMES DAILY . STORE AT ROOM TEMP IN THE ORIGINAL CONTAINER. DO NOT CRUSH OR CHEW. SWALLOW WHOLE.   DULoxetine 60 MG capsule Commonly known as:  CYMBALTA Take 1 capsule (60 mg total) by mouth daily.   gabapentin 800 MG tablet Commonly known as:  NEURONTIN Take 1 tablet (800 mg total) by mouth 3 (three) times daily.   ibuprofen 200 MG tablet Commonly known as:  ADVIL,MOTRIN Take 600 mg by mouth every 6 (six) hours as needed for moderate pain.   oxyCODONE 5 MG immediate release tablet Commonly known as:  Oxy IR/ROXICODONE Take  1-2 tablets (5-10 mg total) by mouth every 4 (four) hours as needed for moderate pain.   traMADol 50 MG tablet Commonly known as:  ULTRAM Take 1 tablet (50 mg total) by mouth every 6 (six) hours as needed. What changed:  reasons to take this   VITAMIN D (CHOLECALCIFEROL)  PO Take 10,000 Units by mouth daily.      Follow-up Information    Excell Seltzer, MD Follow up.   Specialty:  General Surgery Why:  CAll for an appointment in 2 weeks. Contact information: [place] [address] [phone]        Harlan Stains, MD Follow up.   Specialty:  Family Medicine Why:  Call and let them know you had surgery and follow up with all Medical issues. Contact information: [place] [address] [phone]           Signed: Earnstine Regal 12/11/2016, 11:10 AM

## 2016-12-12 MED ORDER — VITAMIN D 1000 UNITS PO TABS
10000.0000 [IU] | ORAL_TABLET | Freq: Every day | ORAL | Status: DC
  Filled 2016-12-12: qty 10

## 2016-12-12 MED ORDER — OXYCODONE HCL 5 MG PO TABS: 5.0000 mg | ORAL_TABLET | ORAL | Status: DC | PRN

## 2016-12-12 NOTE — Progress Notes (Signed)
3 Days Post-Op   Subjective/Chief Complaint: Complains of some RLQ pain. Hasn't passed much flatus   Objective: Vital signs in last 24 hours: Temp:  [98.5 F (36.9 C)-98.7 F (37.1 C)] 98.5 F (36.9 C) (10/27 0534) Pulse Rate:  [66-76] 75 (10/27 0534) Resp:  [13-16] 14 (10/27 0534) BP: (109-126)/(58-72) 122/63 (10/27 0534) SpO2:  [92 %-94 %] 94 % (10/27 0534)    Intake/Output from previous day: 10/26 0701 - 10/27 0700 In: 1080 [P.O.:1080] Out: 1500 [Urine:1500] Intake/Output this shift: No intake/output data recorded.  General appearance: alert and cooperative Resp: clear to auscultation bilaterally Cardio: regular rate and rhythm GI: soft, appropriately tender.   Lab Results:   Recent Labs  12/11/16 0507  WBC 6.6  HGB 10.2*  HCT 31.9*  PLT 366   BMET  Recent Labs  12/11/16 0507  NA 139  K 3.6  CL 101  CO2 30  GLUCOSE 91  BUN 8  CREATININE 0.55  CALCIUM 8.4*   PT/INR No results for input(s): LABPROT, INR in the last 72 hours. ABG No results for input(s): PHART, HCO3 in the last 72 hours.  Invalid input(s): PCO2, PO2  Studies/Results: No results found.  Anti-infectives: Anti-infectives    Start     Dose/Rate Route Frequency Ordered Stop   12/09/16 1239  ceFAZolin (ANCEF) 2-4 GM/100ML-% IVPB    Comments:  Marchia Meiers   : cabinet override      12/09/16 1239 12/09/16 1457   12/09/16 1234  ceFAZolin (ANCEF) IVPB 2g/100 mL premix     2 g 200 mL/hr over 30 Minutes Intravenous 60 min pre-op 12/09/16 1234 12/09/16 1457      Assessment/Plan: s/p Procedure(s): LAPAROSCOPIC INCISIONAL HERNIA REPAIR WITH MESH (N/A) Advance diet  ambulate  LOS: 3 days    TOTH III,PAUL S 12/12/2016

## 2016-12-13 MED ORDER — OXYCODONE-ACETAMINOPHEN 5-325 MG PO TABS: 1.0000 | ORAL_TABLET | Freq: Four times a day (QID) | ORAL | 0 refills | Status: DC | PRN

## 2016-12-13 MED ORDER — LIP MEDEX EX OINT
TOPICAL_OINTMENT | CUTANEOUS | Status: AC
  Filled 2016-12-13: qty 7

## 2016-12-13 NOTE — Progress Notes (Signed)
Nurse reviewed discharge instructions with pt.   Pt verbalized understanding of discharge instructions, follow up appointment and new medication.  Prescription given to pt prior to discharge. 

## 2016-12-13 NOTE — Progress Notes (Signed)
4 Days Post-Op   Subjective/Chief Complaint: No complaints. Having bm's   Objective: Vital signs in last 24 hours: Temp:  [98 F (36.7 C)-98.7 F (37.1 C)] 98 F (36.7 C) (10/28 0558) Pulse Rate:  [78-86] 79 (10/28 0558) Resp:  [14-16] 16 (10/28 0558) BP: (126-147)/(64-73) 126/64 (10/28 0558) SpO2:  [94 %-97 %] 95 % (10/28 0558)    Intake/Output from previous day: 10/27 0701 - 10/28 0700 In: 1080 [P.O.:1080] Out: -  Intake/Output this shift: No intake/output data recorded.  General appearance: alert and cooperative Resp: clear to auscultation bilaterally Cardio: regular rate and rhythm GI: soft, minimal tenderness. incision good  Lab Results:   Recent Labs  12/11/16 0507  WBC 6.6  HGB 10.2*  HCT 31.9*  PLT 366   BMET  Recent Labs  12/11/16 0507  NA 139  K 3.6  CL 101  CO2 30  GLUCOSE 91  BUN 8  CREATININE 0.55  CALCIUM 8.4*   PT/INR No results for input(s): LABPROT, INR in the last 72 hours. ABG No results for input(s): PHART, HCO3 in the last 72 hours.  Invalid input(s): PCO2, PO2  Studies/Results: No results found.  Anti-infectives: Anti-infectives    Start     Dose/Rate Route Frequency Ordered Stop   12/09/16 1239  ceFAZolin (ANCEF) 2-4 GM/100ML-% IVPB    Comments:  Marchia Meiers   : cabinet override      12/09/16 1239 12/09/16 1457   12/09/16 1234  ceFAZolin (ANCEF) IVPB 2g/100 mL premix     2 g 200 mL/hr over 30 Minutes Intravenous 60 min pre-op 12/09/16 1234 12/09/16 1457      Assessment/Plan: s/p Procedure(s): LAPAROSCOPIC INCISIONAL HERNIA REPAIR WITH MESH (N/A) Advance diet Discharge  LOS: 4 days    TOTH III,PAUL S 12/13/2016

## 2017-01-01 DIAGNOSIS — E559 Vitamin D deficiency, unspecified: Secondary | ICD-10-CM | POA: Diagnosis not present

## 2017-01-01 DIAGNOSIS — Z9889 Other specified postprocedural states: Secondary | ICD-10-CM | POA: Diagnosis not present

## 2017-01-01 DIAGNOSIS — D649 Anemia, unspecified: Secondary | ICD-10-CM | POA: Diagnosis not present

## 2017-01-01 DIAGNOSIS — F17200 Nicotine dependence, unspecified, uncomplicated: Secondary | ICD-10-CM | POA: Diagnosis not present

## 2017-01-19 DIAGNOSIS — J209 Acute bronchitis, unspecified: Secondary | ICD-10-CM | POA: Diagnosis not present

## 2017-01-19 DIAGNOSIS — M79602 Pain in left arm: Secondary | ICD-10-CM | POA: Diagnosis not present

## 2017-01-19 DIAGNOSIS — G35 Multiple sclerosis: Secondary | ICD-10-CM | POA: Diagnosis not present

## 2017-01-19 DIAGNOSIS — R202 Paresthesia of skin: Secondary | ICD-10-CM | POA: Diagnosis not present

## 2017-02-02 ENCOUNTER — Other Ambulatory Visit: Payer: Self-pay | Admitting: *Deleted

## 2017-02-02 ENCOUNTER — Telehealth: Payer: Self-pay | Admitting: Neurology

## 2017-02-02 MED ORDER — BACLOFEN 10 MG PO TABS: 10.0000 mg | ORAL_TABLET | Freq: Four times a day (QID) | ORAL | 0 refills | Status: DC

## 2017-02-02 NOTE — Telephone Encounter (Addendum)
Spoke to [place] - no signs/symptoms of infection.  She is having an increase in lower extremity cramps and numbness present only in left arm/hand that worsens when she is holding weighted items.  She denies any motor weakness.  Per vo by Dr. Krista Blue, increase baclofen 10mg  from TID to QID to help with cramps and schedule NCV/EMG to further evaluate left arm/hand numbness. Pt is agreeable to this plan.  New rx sent to pharmacy and appt scheduled for testing.

## 2017-02-02 NOTE — Telephone Encounter (Signed)
Pt states that for the last few weeks off and on she has been experiencing numbness in hands,down entire arm, then numbness in back to buttox.  Pt states she is unable to hold much of anything for too long without hand going numb.  If there is a way of getting pt in before 02-26 please call

## 2017-02-12 DIAGNOSIS — D473 Essential (hemorrhagic) thrombocythemia: Secondary | ICD-10-CM | POA: Diagnosis not present

## 2017-03-05 ENCOUNTER — Ambulatory Visit (INDEPENDENT_AMBULATORY_CARE_PROVIDER_SITE_OTHER): Payer: PPO | Admitting: Neurology

## 2017-03-05 ENCOUNTER — Ambulatory Visit: Payer: PPO | Admitting: Neurology

## 2017-03-05 ENCOUNTER — Encounter: Payer: Self-pay | Admitting: Neurology

## 2017-03-05 DIAGNOSIS — G35 Multiple sclerosis: Secondary | ICD-10-CM

## 2017-03-05 DIAGNOSIS — R202 Paresthesia of skin: Secondary | ICD-10-CM | POA: Diagnosis not present

## 2017-03-05 NOTE — Procedures (Signed)
Full Name: Andrea Cantrell Gender: Female MRN #: [id number] Date of Birth: 04/27/1950    Visit Date: 03/05/2017 07:46 Age: 67 Years 39 Months Old Examining Physician: Marcial Pacas, MD  Referring Physician: Krista Blue, MD History: 67 year old female complains of intermittent left arm paresthesia, radiating pain  Summary of the tests:  Nerve conduction study: Left median, ulnar sensory and motor responses were normal.  Electromyography: Selective needle examination of left upper extremity muscles and left cervical paraspinal muscles was performed, there was no significant abnormality noted.  Conclusion:  This is a normal study.  There is no electrodiagnostic evidence of left upper extremity neuropathy or left cervical radiculopathy.   ------------------------------- Marcial Pacas, M.D.  [facility] [place], [place] [address] Tel: [phone] Fax: [phone]        [facility]    Nerve / Sites Muscle Latency Ref. Amplitude Ref. Rel Amp Segments Distance Velocity Ref. Area    ms ms mV mV %  cm m/s m/s mVms  L Median - APB     Wrist APB 3.0 ?4.4 7.8 ?4.0 100 Wrist - APB 7   22.0     Upper arm APB 6.8  7.2  91.7 Upper arm - Wrist 21 55 ?49 21.0  L Ulnar - ADM     Wrist ADM 2.7 ?3.3 6.9 ?6.0 100 Wrist - ADM 7   19.9     B.Elbow ADM 5.4  6.2  89.8 B.Elbow - Wrist 16 59 ?49 18.7     A.Elbow ADM 7.3  5.7  91 A.Elbow - B.Elbow 10 52 ?49 18.4         A.Elbow - Wrist             SNC    Nerve / Sites Rec. Site Peak Lat Ref.  Amp Ref. Segments Distance Peak Diff Ref.    ms ms V V  cm ms ms  L Radial - Anatomical snuff box (Forearm)     Forearm Wrist 2.1 ?2.9 28 ?15 Forearm - Wrist 10    L Median, Ulnar - Transcarpal comparison     Median Palm Wrist 2.2 ?2.2 37 ?35 Median Palm - Wrist 8       Ulnar Palm Wrist 1.9 ?2.2 12 ?12 Ulnar Palm - Wrist 8          Median Palm - Ulnar Palm  0.3 ?0.4  L Median - Orthodromic (Dig II, Mid palm)     Dig II Wrist 3.0 ?3.4  10 ?10 Dig II - Wrist 13    L Ulnar - Orthodromic, (Dig V, Mid palm)     Dig V Wrist 2.4 ?3.1 8 ?5 Dig V - Wrist 88               F  Wave    Nerve F Lat Ref.   ms ms  L Ulnar - ADM 26.5 ?32.0       EMG full       EMG Summary Table    Spontaneous MUAP Recruitment  Muscle IA Fib PSW Fasc Other Amp Dur. Poly Pattern  L. First dorsal interosseous Normal None None None _______ Normal Normal Normal Normal  L. Pronator teres Normal None None None _______ Normal Normal Normal Normal  L. Biceps brachii Normal None None None _______ Normal Normal Normal Normal  L. Deltoid Normal None None None _______ Normal Normal Normal Normal  L. Triceps brachii Normal None None None _______ Normal Normal Normal  Normal  L. Extensor digitorum communis Normal None None None _______ Normal Normal Normal Normal  L. Cervical paraspinals Normal None None None _______ Normal Normal Normal Normal

## 2017-03-05 NOTE — Progress Notes (Signed)
PATIENT: Andrea Cantrell DOB: October 06, 1950  REASON FOR VISIT: follow up HISTORY FROM: patient  HISTORY OF PRESENT ILLNESS: HISTORY   HISTORY (Andrea Cantrell): Ms Conde, is a 67 year old female returns for followup of relapsing remitting multiple sclerosis  She has a history of multiple sclerosis since 1990s, also with past medical history of hypertension, hyperlipidemia, fibromyalgia, and discoid lupus in the 1990s. She rarely has a flareup from her lupus   She has episodes of gait difficulty, in 2006, took about 6 months to recover. From then on, she kept on having episodes of worsening gait difficulty, blurry vision. Diagnosis was made in 2013, based upon abnormal MRI.  MRI scan of the brain shows multiple periventricular lesions solitary enhancing left frontal subcortical lesion with multiple nonenhancing periventricular and subcortical hypointense lesions consistent with myelinating disease.   MRI of the cervical spine with mild degenerative changes but no enhancing lesions are noted.   She was started ontecfidera in October 2013and has tolerated the medication extremely well. Initially she thought she developed a rash but apparently that was due to a GI virus and that has disappeared.   UPDATE June 10th 2015:  She still complains of low back, lower extremity pain, muscle spasm at her toes, she went to hospital in Jul 05 2013, complains of chest tightness, chest pain, near syncope episode, EKG was normal, chest x-ray was normal, laboratory showed normal CBC, CMP with exception of mild elevated alkaline phosphate 140, she was diagnosed with dehydration, which has improved with IV fluid, and pain medications, normal nuclear stress test in October 2015  She now complains of left neck pain, bilateral lower extremity pain  UPDATE Feb 8th 2016: She complains of right knee pain, right foot numbness since Mar 18 2014, going up her right leg, when she put pressure on her right  foot, she noticed right toe pain, going up her right leg, She also complains of low back pain, going down her left hip and left leg, she also has right arm intermittent sharp pain.  She went to ED in Dec 27th 2015 for worsening left side low back pain, shooting pain to left leg.   MRI lumbar in February 2016, severe facet arthrosis at L4-5 with new grade 1 anterolisthesis and severe spinal stenosis. Moderate multifactorial spinal stenosis and moderate left neural foraminal stenosis at L3-4.  She has developed severe right shoulder pain in March 2016, hematoma at right forearm, MRI right shoulder March 24th 2016: Complete tear of the supraspinatus tendon with 3.4 cm of retraction. Complete tear of the intraarticular portion of the long head of the biceps tendon. Mild osteoarthritis of the glenohumeral joint. Moderate degenerative changes of the acromioclavicular joint.  She will have right rotator cuff surgery in July 23 2014 by Dr. Veverly Fells, has stopped aspirin in June first 2016, ibuprofen 5 days prior to that.  She also complains of chronic low back pain, worsening with prolonged walking, or bearing weight, is taking tramadol 50 mg 2 tablets twice a day, which has been effective.  UPDATE Dec 5th 2016: She did have right rotator cuff surgery in June of 2016 by orthopedic surgeon Dr. Veverly Fells, recovered very well, she continue have low back pain, radiating pain to bilateral lower extremity, especially early morning, she denies bowel and bladder incontinence, no significant gait difficulty, but complains of increased low back pain, radiating pain to bilateral lower extremity and lack of stamina We have personally reviewed MRI of lumbar spine in February 2016,  multilevel degenerative changes, most severe at L4-5, with moderate to severe spinal canal stenosis  UPDATE October 07 2016: Repeat laboratory in May 2018 showed normal hemoglobin 13.5,She complains of worsening bilateral knee pain, gait  abnormality,  is seeing orthopedic surgeon, from  she is stable  UPDATE Mar 05 2017:  Since last visit in August, she was admitted to the hospital in October 2018, for partial small bowel obstruction of small intestine lower midline incisional hernia, she underwent laparoscopic incisional hernia repair with mesh on December 09, 2016, regained slow recovery,  Since November 2018, she noticed intermittent left arm and leg numbness, mild gait abnormality, EMG nerve conduction study today showed no significant abnormality, there is no evidence of left upper extremity neuropathy or cervical radiculopathy.  REVIEW OF SYSTEMS: Out of a complete 14 system review of symptoms, the patient complains only of the following symptoms, and all other reviewed systems are negative.  Knee pain, gait abnormality  ALLERGIES: No Known Allergies  HOME MEDICATIONS: Outpatient Medications Prior to Visit  Medication Sig Dispense Refill  . albuterol (PROVENTIL HFA;VENTOLIN HFA) 108 (90 BASE) MCG/ACT inhaler Inhale 2 puffs into the lungs every 6 (six) hours as needed for wheezing or shortness of breath (wheezing and shortness of breath). For shortness of breath.    Marland Kitchen amLODipine (NORVASC) 5 MG tablet Take 5 mg by mouth every morning.     Marland Kitchen aspirin EC 81 MG tablet Take 162 mg by mouth every evening.     Marland Kitchen atorvastatin (LIPITOR) 10 MG tablet Take 10 mg by mouth every morning.     . baclofen (LIORESAL) 10 MG tablet Take 1 tablet (10 mg total) by mouth 4 (four) times daily. 360 tablet 0  . Dimethyl Fumarate (TECFIDERA) 240 MG CPDR TAKE ONE CAPSULE (240 MG) BY MOUTH TWO TIMES DAILY . STORE AT ROOM TEMP IN THE ORIGINAL CONTAINER. DO NOT CRUSH OR CHEW. SWALLOW WHOLE. 180 capsule 3  . DULoxetine (CYMBALTA) 60 MG capsule Take 1 capsule (60 mg total) by mouth daily. 90 capsule 4  . gabapentin (NEURONTIN) 800 MG tablet Take 1 tablet (800 mg total) by mouth 3 (three) times daily. 270 tablet 4  . ibuprofen (ADVIL,MOTRIN) 200 MG  tablet Take 600 mg by mouth every 6 (six) hours as needed for moderate pain.    Marland Kitchen oxyCODONE (OXY IR/ROXICODONE) 5 MG immediate release tablet Take 1-2 tablets (5-10 mg total) by mouth every 4 (four) hours as needed for moderate pain. 30 tablet 0  . oxyCODONE-acetaminophen (ROXICET) 5-325 MG tablet Take 1 tablet by mouth every 6 (six) hours as needed for severe pain. 15 tablet 0  . polyethylene glycol (MIRALAX / GLYCOLAX) packet Uses as needed for constipation at home.  He can buy this over-the-counter at any drugstore.  Follow package instructions. 14 each 0  . traMADol (ULTRAM) 50 MG tablet Take 1 tablet (50 mg total) by mouth every 6 (six) hours as needed. (Patient taking differently: Take 50 mg by mouth every 6 (six) hours as needed for moderate pain. ) 90 tablet 3  . VITAMIN D, CHOLECALCIFEROL, PO Take 10,000 Units by mouth daily.      No facility-administered medications prior to visit.     PAST MEDICAL HISTORY: Past Medical History:  Diagnosis Date  . Discoid lupus   . Essential hypertension   . Fibromyalgia   . Heavy smoker (more than 20 cigarettes per day)    Was able to stop for 11 years but restarted about 10 years ago  .  Hyperlipidemia with target LDL less than 130   . MS (multiple sclerosis) ([place])   . Obesity (BMI 35.0-39.9 without comorbidity)   . Spinal stenosis     PAST SURGICAL HISTORY: Past Surgical History:  Procedure Laterality Date  . ABDOMINAL HYSTERECTOMY  1995  . BREAST SURGERY Left    tissue removal  . [place]  . CHOLECYSTECTOMY  1994  . INCISIONAL HERNIA REPAIR N/A 12/09/2016   Procedure: LAPAROSCOPIC INCISIONAL HERNIA REPAIR WITH MESH;  Surgeon: Excell Seltzer, MD;  Location: [facility];  Service: General;  Laterality: N/A;  . NM MYOVIEW LTD  October 2015   EF 71%. No ischemia or infarction. Low risk/normal  . ROTATOR CUFF REPAIR Left June 2016    FAMILY HISTORY: Family History  Problem Relation Age of Onset  . Cancer Mother     . Cancer Father   . Diabetes Brother   . Diabetes Maternal Grandmother     SOCIAL HISTORY: Social History   Socioeconomic History  . Marital status: Legally Separated    Spouse name: Not on file  . Number of children: 2  . Years of education: 12+  . Highest education level: Not on file  Social Needs  . Financial resource strain: Not on file  . Food insecurity - worry: Not on file  . Food insecurity - inability: Not on file  . Transportation needs - medical: Not on file  . Transportation needs - non-medical: Not on file  Occupational History  . Not on file  Tobacco Use  . Smoking status: Current Every Day Smoker    Packs/day: 1.00    Types: Cigarettes  . Smokeless tobacco: Never Used  Substance and Sexual Activity  . Alcohol use: No  . Drug use: No  . Sexual activity: Not on file  Other Topics Concern  . Not on file  Social History Narrative   Patient lives at home with granddaughter her her 2 children.    Patient is separated. Patient has 2 children.    Patient has some college. Patient is on Disability.    Smokes roughly one pack a day. No alcohol.      PHYSICAL EXAM  There were no vitals filed for this visit. There is no height or weight on file to calculate BMI.  Generalized: Well developed, in no acute distress   Neurological examination  Mentation: Alert oriented to time, place, history taking. Follows all commands speech and language fluent Cranial nerve II-XII: Pupils were equal round reactive to light. Extraocular movements were full, visual field were full on confrontational test. Facial sensation and strength were normal. Uvula tongue midline. Head turning and shoulder shrug  were normal and symmetric. Motor: The motor testing reveals 5 over 5 strength of all 4 extremities. Good symmetric motor tone is noted throughout. Discomfort to palpation on the left lower back and buttocks. Sensory: Sensory testing is intact to soft touch on all 4 extremities. No  evidence of extinction is noted.  Coordination: Cerebellar testing reveals good finger-nose-finger and heel-to-shin bilaterally.  Gait and station: Antalgic, cautious mildly unsteady Reflexes: Deep tendon reflexes are symmetric and normal bilaterally.   DIAGNOSTIC DATA (LABS, IMAGING, TESTING) - I reviewed patient records, labs, notes, testing and imaging myself where available.  Lab Results  Component Value Date   WBC 6.6 12/11/2016   HGB 10.2 (L) 12/11/2016   HCT 31.9 (L) 12/11/2016   MCV 78.4 12/11/2016   PLT 366 12/11/2016      Component  Value Date/Time   NA 139 12/11/2016 0507   NA 141 04/09/2016 0906   K 3.6 12/11/2016 0507   CL 101 12/11/2016 0507   CO2 30 12/11/2016 0507   GLUCOSE 91 12/11/2016 0507   BUN 8 12/11/2016 0507   BUN 10 04/09/2016 0906   CREATININE 0.55 12/11/2016 0507   CALCIUM 8.4 (L) 12/11/2016 0507   PROT 8.5 (H) 12/09/2016 0340   PROT 6.5 04/09/2016 0906   ALBUMIN 4.5 12/09/2016 0340   ALBUMIN 3.8 04/09/2016 0906   AST 22 12/09/2016 0340   ALT 16 12/09/2016 0340   ALKPHOS 118 12/09/2016 0340   BILITOT 0.9 12/09/2016 0340   BILITOT 0.4 04/09/2016 0906   GFRNONAA >60 12/11/2016 0507   GFRAA >60 12/11/2016 0507    Lab Results  Component Value Date   TSH 0.739 04/13/2014      ASSESSMENT AND PLAN 67 y.o. year old female   Relapsing remitting multiple sclerosis  Doing well on Tecfidera twice a day  Increased left arm and left numbness, last MRI was in 2017, will have repeat MRI of the brain without contrast follow-up visit worse.  Chronic low back pain, lumbar radiculopathy  MRI of lumbar spine was scheduled,  Marcial Pacas, M.D. Ph.D.  [facility] [place], [place] [address] Phone: [phone] Fax:      [phone]

## 2017-03-18 ENCOUNTER — Ambulatory Visit
Admission: RE | Admit: 2017-03-18 | Discharge: 2017-03-18 | Disposition: A | Discharge status: Home / Self Care | Payer: PPO | Source: Ambulatory Visit | Attending: Neurology | Admitting: Neurology

## 2017-03-18 DIAGNOSIS — G35 Multiple sclerosis: Secondary | ICD-10-CM | POA: Diagnosis not present

## 2017-03-18 MED ORDER — GADOBENATE DIMEGLUMINE 529 MG/ML IV SOLN
17.0000 mL | Freq: Once | INTRAVENOUS | Status: AC | PRN
  Administered 2017-03-18: 17 mL via INTRAVENOUS

## 2017-03-26 ENCOUNTER — Other Ambulatory Visit: Payer: Self-pay | Admitting: Orthopedic Surgery

## 2017-03-26 DIAGNOSIS — M48061 Spinal stenosis, lumbar region without neurogenic claudication: Secondary | ICD-10-CM

## 2017-04-02 DIAGNOSIS — R1032 Left lower quadrant pain: Secondary | ICD-10-CM | POA: Diagnosis not present

## 2017-04-02 DIAGNOSIS — R198 Other specified symptoms and signs involving the digestive system and abdomen: Secondary | ICD-10-CM | POA: Diagnosis not present

## 2017-04-04 ENCOUNTER — Other Ambulatory Visit: Payer: Self-pay | Admitting: Neurology

## 2017-04-13 ENCOUNTER — Encounter: Payer: Self-pay | Admitting: Neurology

## 2017-04-13 ENCOUNTER — Ambulatory Visit (INDEPENDENT_AMBULATORY_CARE_PROVIDER_SITE_OTHER): Payer: PPO | Admitting: Neurology

## 2017-04-13 VITALS — BP 104/72 | HR 77 | Wt 177.5 lb

## 2017-04-13 DIAGNOSIS — M545 Low back pain, unspecified: Secondary | ICD-10-CM | POA: Insufficient documentation

## 2017-04-13 DIAGNOSIS — G35 Multiple sclerosis: Secondary | ICD-10-CM

## 2017-04-13 NOTE — Progress Notes (Signed)
PATIENT: Andrea Cantrell DOB: 02-08-1951  REASON FOR VISIT: follow up HISTORY FROM: patient  HISTORY OF PRESENT ILLNESS: HISTORY   HISTORY (Deamber Buckhalter): Andrea Cantrell, is a 67 year old female returns for followup of relapsing remitting multiple sclerosis  She has a history of multiple sclerosis since 1990s, also with past medical history of hypertension, hyperlipidemia, fibromyalgia, and discoid lupus in the 1990s. She rarely has a flareup from her lupus   She has episodes of gait difficulty, in 2006, took about 6 months to recover. From then on, she kept on having episodes of worsening gait difficulty, blurry vision. Diagnosis was made in 2013, based upon abnormal MRI.  MRI scan of the brain shows multiple periventricular lesions solitary enhancing left frontal subcortical lesion with multiple nonenhancing periventricular and subcortical hypointense lesions consistent with myelinating disease.   MRI of the cervical spine with mild degenerative changes but no enhancing lesions are noted.   She was started ontecfidera in October 2013and has tolerated the medication extremely well. Initially she thought she developed a rash but apparently that was due to a GI virus and that has disappeared.   UPDATE June 10th 2015:  She still complains of low back, lower extremity pain, muscle spasm at her toes, she went to hospital in Jul 05 2013, complains of chest tightness, chest pain, near syncope episode, EKG was normal, chest x-ray was normal, laboratory showed normal CBC, CMP with exception of mild elevated alkaline phosphate 140, she was diagnosed with dehydration, which has improved with IV fluid, and pain medications, normal nuclear stress test in October 2015  She now complains of left neck pain, bilateral lower extremity pain  UPDATE Feb 8th 2016: She complains of right knee pain, right foot numbness since Mar 18 2014, going up her right leg, when she put pressure on her right  foot, she noticed right toe pain, going up her right leg, She also complains of low back pain, going down her left hip and left leg, she also has right arm intermittent sharp pain.  She went to ED in Dec 27th 2015 for worsening left side low back pain, shooting pain to left leg.   MRI lumbar in February 2016, severe facet arthrosis at L4-5 with new grade 1 anterolisthesis and severe spinal stenosis. Moderate multifactorial spinal stenosis and moderate left neural foraminal stenosis at L3-4.  She has developed severe right shoulder pain in March 2016, hematoma at right forearm, MRI right shoulder March 24th 2016: Complete tear of the supraspinatus tendon with 3.4 cm of retraction. Complete tear of the intraarticular portion of the long head of the biceps tendon. Mild osteoarthritis of the glenohumeral joint. Moderate degenerative changes of the acromioclavicular joint.  She will have right rotator cuff surgery in July 23 2014 by Dr. Veverly Fells, has stopped aspirin in June first 2016, ibuprofen 5 days prior to that.  She also complains of chronic low back pain, worsening with prolonged walking, or bearing weight, is taking tramadol 50 mg 2 tablets twice a day, which has been effective.  UPDATE Dec 5th 2016: She did have right rotator cuff surgery in June of 2016 by orthopedic surgeon Dr. Veverly Fells, recovered very well, she continue have low back pain, radiating pain to bilateral lower extremity, especially early morning, she denies bowel and bladder incontinence, no significant gait difficulty, but complains of increased low back pain, radiating pain to bilateral lower extremity and lack of stamina We have personally reviewed MRI of lumbar spine in February 2016,  multilevel degenerative changes, most severe at L4-5, with moderate to severe spinal canal stenosis  UPDATE October 07 2016: Repeat laboratory in May 2018 showed normal hemoglobin 13.5,She complains of worsening bilateral knee pain, gait  abnormality,  is seeing orthopedic surgeon, from  she is stable  UPDATE Mar 05 2017:  Since last visit in August, she was admitted to the hospital in October 2018, for partial small bowel obstruction of small intestine lower midline incisional hernia, she underwent laparoscopic incisional hernia repair with mesh on December 09, 2016, regained slow recovery,  Since November 2018, she noticed intermittent left arm and leg numbness, mild gait abnormality, EMG nerve conduction study today showed no significant abnormality, there is no evidence of left upper extremity neuropathy or cervical radiculopathy.  UPDATE Apr 13 2017: She complains of left knee pain, gait abnormality due to knee pain, I have personally reviewed MRI of the brain with and without contrast in January 2019, there is evidence of multiple T2/FLAIR hyperintensity signal in the periventricular, cortical, pericallosal, deep white matter of both hemisphere, in the pons, in a pattern and configuration consistent with chronic demyelinating disease, no contrast enhancement, no change compared to previous 12/06/2015,  She also complains of worsening low back pain, have CT myelogram pending by her orthopedic surgeon.  REVIEW OF SYSTEMS: Out of a complete 14 system review of symptoms, the patient complains only of the following symptoms, and all other reviewed systems are negative.  Knee pain, gait abnormality  ALLERGIES: No Known Allergies  HOME MEDICATIONS: Outpatient Medications Prior to Visit  Medication Sig Dispense Refill  . albuterol (PROVENTIL HFA;VENTOLIN HFA) 108 (90 BASE) MCG/ACT inhaler Inhale 2 puffs into the lungs every 6 (six) hours as needed for wheezing or shortness of breath (wheezing and shortness of breath). For shortness of breath.    Marland Kitchen amLODipine (NORVASC) 5 MG tablet Take 5 mg by mouth every morning.     Marland Kitchen aspirin EC 81 MG tablet Take 162 mg by mouth every evening.     Marland Kitchen atorvastatin (LIPITOR) 10 MG tablet Take  10 mg by mouth every morning.     . baclofen (LIORESAL) 10 MG tablet Take 1 tablet (10 mg total) by mouth 4 (four) times daily. 360 tablet 0  . Dimethyl Fumarate (TECFIDERA) 240 MG CPDR TAKE ONE CAPSULE (240 MG) BY MOUTH TWO TIMES DAILY . STORE AT ROOM TEMP IN THE ORIGINAL CONTAINER. DO NOT CRUSH OR CHEW. SWALLOW WHOLE. 180 capsule 3  . gabapentin (NEURONTIN) 800 MG tablet Take 1 tablet (800 mg total) by mouth 3 (three) times daily. 270 tablet 4  . ibuprofen (ADVIL,MOTRIN) 200 MG tablet Take 600 mg by mouth every 6 (six) hours as needed for moderate pain.    . Iron-Vitamins (GERITOL COMPLETE) TABS Take by mouth.    . polyethylene glycol (MIRALAX / GLYCOLAX) packet Uses as needed for constipation at home.  He can buy this over-the-counter at any drugstore.  Follow package instructions. 14 each 0  . traMADol (ULTRAM) 50 MG tablet TAKE 1 TABLET BY MOUTH EVERY 6 HOURS AS NEEDED 90 tablet 2  . VITAMIN D, CHOLECALCIFEROL, PO Take 10,000 Units by mouth daily.      No facility-administered medications prior to visit.     PAST MEDICAL HISTORY: Past Medical History:  Diagnosis Date  . Discoid lupus   . Essential hypertension   . Fibromyalgia   . Heavy smoker (more than 20 cigarettes per day)    Was able to stop for 11 years but  restarted about 10 years ago  . Hyperlipidemia with target LDL less than 130   . Andrea (multiple sclerosis) ([place])   . Obesity (BMI 35.0-39.9 without comorbidity)   . Spinal stenosis     PAST SURGICAL HISTORY: Past Surgical History:  Procedure Laterality Date  . ABDOMINAL HYSTERECTOMY  1995  . BREAST SURGERY Left    tissue removal  . [place]  . CHOLECYSTECTOMY  1994  . INCISIONAL HERNIA REPAIR N/A 12/09/2016   Procedure: LAPAROSCOPIC INCISIONAL HERNIA REPAIR WITH MESH;  Surgeon: Excell Seltzer, MD;  Location: [facility];  Service: General;  Laterality: N/A;  . NM MYOVIEW LTD  October 2015   EF 71%. No ischemia or infarction. Low risk/normal  .  ROTATOR CUFF REPAIR Left June 2016    FAMILY HISTORY: Family History  Problem Relation Age of Onset  . Cancer Mother   . Cancer Father   . Diabetes Brother   . Diabetes Maternal Grandmother     SOCIAL HISTORY: Social History   Socioeconomic History  . Marital status: Legally Separated    Spouse name: Not on file  . Number of children: 2  . Years of education: 12+  . Highest education level: Not on file  Social Needs  . Financial resource strain: Not on file  . Food insecurity - worry: Not on file  . Food insecurity - inability: Not on file  . Transportation needs - medical: Not on file  . Transportation needs - non-medical: Not on file  Occupational History  . Not on file  Tobacco Use  . Smoking status: Current Every Day Smoker    Packs/day: 1.00    Types: Cigarettes  . Smokeless tobacco: Never Used  Substance and Sexual Activity  . Alcohol use: No  . Drug use: No  . Sexual activity: Not on file  Other Topics Concern  . Not on file  Social History Narrative   Patient lives at home with granddaughter her her 2 children.    Patient is separated. Patient has 2 children.    Patient has some college. Patient is on Disability.    Smokes roughly one pack a day. No alcohol.      PHYSICAL EXAM  Vitals:   04/13/17 1241  BP: 104/72  Pulse: 77  Weight: 177 lb 8 oz (80.5 kg)   Body mass index is 32.47 kg/m.  Generalized: Well developed, in no acute distress   Neurological examination  Mentation: Alert oriented to time, place, history taking. Follows all commands speech and language fluent Cranial nerve II-XII: Pupils were equal round reactive to light. Extraocular movements were full, visual field were full on confrontational test. Facial sensation and strength were normal. Uvula tongue midline. Head turning and shoulder shrug  were normal and symmetric. Motor: The motor testing reveals 5 over 5 strength of all 4 extremities. Good symmetric motor tone is noted  throughout. Discomfort to palpation on the left lower back and buttocks. Sensory: Sensory testing is intact to soft touch on all 4 extremities. No evidence of extinction is noted.  Coordination: Cerebellar testing reveals good finger-nose-finger and heel-to-shin bilaterally.  Gait and station: Antalgic, cautious mildly unsteady Reflexes: Deep tendon reflexes are symmetric and normal bilaterally.   DIAGNOSTIC DATA (LABS, IMAGING, TESTING) - I reviewed patient records, labs, notes, testing and imaging myself where available.  Lab Results  Component Value Date   WBC 6.6 12/11/2016   HGB 10.2 (L) 12/11/2016   HCT 31.9 (L) 12/11/2016  MCV 78.4 12/11/2016   PLT 366 12/11/2016      Component Value Date/Time   NA 139 12/11/2016 0507   NA 141 04/09/2016 0906   K 3.6 12/11/2016 0507   CL 101 12/11/2016 0507   CO2 30 12/11/2016 0507   GLUCOSE 91 12/11/2016 0507   BUN 8 12/11/2016 0507   BUN 10 04/09/2016 0906   CREATININE 0.55 12/11/2016 0507   CALCIUM 8.4 (L) 12/11/2016 0507   PROT 8.5 (H) 12/09/2016 0340   PROT 6.5 04/09/2016 0906   ALBUMIN 4.5 12/09/2016 0340   ALBUMIN 3.8 04/09/2016 0906   AST 22 12/09/2016 0340   ALT 16 12/09/2016 0340   ALKPHOS 118 12/09/2016 0340   BILITOT 0.9 12/09/2016 0340   BILITOT 0.4 04/09/2016 0906   GFRNONAA >60 12/11/2016 0507   GFRAA >60 12/11/2016 0507    Lab Results  Component Value Date   TSH 0.739 04/13/2014      ASSESSMENT AND PLAN 67 y.o. year old female   Relapsing remitting multiple sclerosis  Doing well on Tecfidera twice a day  Repeat MRI of the brain in January 2019 was stable.  Chronic low back pain, lumbar radiculopathy  CT myelogram is planned by her primary care physician  Marcial Pacas, M.D. Ph.D.  [facility] [place], [place] [address] Phone: [phone] Fax:      [phone]

## 2017-04-16 ENCOUNTER — Ambulatory Visit
Admission: RE | Admit: 2017-04-16 | Discharge: 2017-04-16 | Disposition: A | Discharge status: Home / Self Care | Payer: PPO | Source: Ambulatory Visit | Attending: Orthopedic Surgery | Admitting: Orthopedic Surgery

## 2017-04-16 VITALS — BP 94/57 | HR 72

## 2017-04-16 DIAGNOSIS — M48061 Spinal stenosis, lumbar region without neurogenic claudication: Secondary | ICD-10-CM

## 2017-04-16 DIAGNOSIS — M5416 Radiculopathy, lumbar region: Secondary | ICD-10-CM

## 2017-04-16 MED ORDER — ONDANSETRON HCL 4 MG/2ML IJ SOLN
4.0000 mg | Freq: Once | INTRAMUSCULAR | Status: AC
  Administered 2017-04-16: 4 mg via INTRAMUSCULAR

## 2017-04-16 MED ORDER — DIAZEPAM 5 MG PO TABS
5.0000 mg | ORAL_TABLET | Freq: Once | ORAL | Status: AC
  Administered 2017-04-16: 5 mg via ORAL

## 2017-04-16 MED ORDER — ONDANSETRON HCL 4 MG/2ML IJ SOLN: 4.0000 mg | Freq: Four times a day (QID) | INTRAMUSCULAR | Status: DC | PRN

## 2017-04-16 MED ORDER — IOPAMIDOL (ISOVUE-M 200) INJECTION 41%
15.0000 mL | Freq: Once | INTRAMUSCULAR | Status: AC
Start: 2017-04-16 — End: 2017-04-16
  Administered 2017-04-16: 15 mL via INTRATHECAL

## 2017-04-16 MED ORDER — MEPERIDINE HCL 100 MG/ML IJ SOLN
50.0000 mg | Freq: Once | INTRAMUSCULAR | Status: AC
  Administered 2017-04-16: 50 mg via INTRAMUSCULAR

## 2017-04-16 NOTE — Discharge Instructions (Signed)
Myelogram Discharge Instructions  1. Go home and rest quietly for the next 24 hours.  It is important to lie flat for the next 24 hours.  Get up only to go to the restroom.  You may lie in the bed or on a couch on your back, your stomach, your left side or your right side.  You may have one pillow under your head.  You may have pillows between your knees while you are on your side or under your knees while you are on your back.  2. DO NOT drive today.  Recline the seat as far back as it will go, while still wearing your seat belt, on the way home.  3. You may get up to go to the bathroom as needed.  You may sit up for 10 minutes to eat.  You may resume your normal diet and medications unless otherwise indicated.  Drink lots of extra fluids today and tomorrow.  4. The incidence of headache, nausea, or vomiting is about 5% (one in 20 patients).  If you develop a headache, lie flat and drink plenty of fluids until the headache goes away.  Caffeinated beverages may be helpful.  If you develop severe nausea and vomiting or a headache that does not go away with flat bed rest, call [phone].  5. You may resume normal activities after your 24 hours of bed rest is over; however, do not exert yourself strongly or do any heavy lifting tomorrow. If when you get up you have a headache when standing, go back to bed and force fluids for another 24 hours.  6. Call your physician for a follow-up appointment.  The results of your myelogram will be sent directly to your physician by the following day.  7. If you have any questions or if complications develop after you arrive home, please call [phone].  Discharge instructions have been explained to the patient.  The patient, or the person responsible for the patient, fully understands these instructions.  YOU MAY RESTART YOUR TRAMADOL TOMORROW 04/17/2017 AT 10:30AM.

## 2017-04-22 DIAGNOSIS — M25562 Pain in left knee: Secondary | ICD-10-CM | POA: Diagnosis not present

## 2017-04-22 DIAGNOSIS — M179 Osteoarthritis of knee, unspecified: Secondary | ICD-10-CM | POA: Insufficient documentation

## 2017-04-22 DIAGNOSIS — M1712 Unilateral primary osteoarthritis, left knee: Secondary | ICD-10-CM | POA: Diagnosis not present

## 2017-04-22 DIAGNOSIS — M25561 Pain in right knee: Secondary | ICD-10-CM | POA: Diagnosis not present

## 2017-05-06 DIAGNOSIS — K5901 Slow transit constipation: Secondary | ICD-10-CM | POA: Diagnosis not present

## 2017-05-06 DIAGNOSIS — R1032 Left lower quadrant pain: Secondary | ICD-10-CM | POA: Diagnosis not present

## 2017-05-18 ENCOUNTER — Other Ambulatory Visit: Payer: Self-pay | Admitting: Adult Health

## 2017-07-11 ENCOUNTER — Other Ambulatory Visit: Payer: Self-pay | Admitting: Neurology

## 2017-07-13 ENCOUNTER — Other Ambulatory Visit: Payer: Self-pay | Admitting: Neurology

## 2017-07-13 NOTE — Telephone Encounter (Signed)
Rx registry checked and her last fill date was 06/14/17 for #90. Her last OV was 04/13/17 and next OV is 04/19/18.

## 2017-07-16 DIAGNOSIS — I10 Essential (primary) hypertension: Secondary | ICD-10-CM | POA: Diagnosis not present

## 2017-07-16 DIAGNOSIS — E785 Hyperlipidemia, unspecified: Secondary | ICD-10-CM | POA: Diagnosis not present

## 2017-07-16 DIAGNOSIS — M25561 Pain in right knee: Secondary | ICD-10-CM | POA: Diagnosis not present

## 2017-07-16 DIAGNOSIS — M25562 Pain in left knee: Secondary | ICD-10-CM | POA: Diagnosis not present

## 2017-07-16 DIAGNOSIS — E559 Vitamin D deficiency, unspecified: Secondary | ICD-10-CM | POA: Diagnosis not present

## 2017-07-16 DIAGNOSIS — D508 Other iron deficiency anemias: Secondary | ICD-10-CM | POA: Diagnosis not present

## 2017-07-30 ENCOUNTER — Other Ambulatory Visit: Payer: Self-pay | Admitting: Neurology

## 2017-07-30 ENCOUNTER — Other Ambulatory Visit: Payer: Self-pay | Admitting: Family Medicine

## 2017-07-30 ENCOUNTER — Ambulatory Visit
Admission: RE | Admit: 2017-07-30 | Discharge: 2017-07-30 | Disposition: A | Discharge status: Home / Self Care | Payer: PPO | Source: Ambulatory Visit | Attending: Family Medicine | Admitting: Family Medicine

## 2017-07-30 DIAGNOSIS — M25461 Effusion, right knee: Secondary | ICD-10-CM | POA: Diagnosis not present

## 2017-07-30 DIAGNOSIS — M25561 Pain in right knee: Secondary | ICD-10-CM

## 2017-07-30 DIAGNOSIS — M25562 Pain in left knee: Secondary | ICD-10-CM

## 2017-08-18 ENCOUNTER — Encounter: Payer: Self-pay | Admitting: Cardiology

## 2017-08-18 ENCOUNTER — Ambulatory Visit: Payer: PPO | Admitting: Cardiology

## 2017-08-18 VITALS — BP 133/71 | HR 97 | Ht 62.0 in | Wt 163.8 lb

## 2017-08-18 DIAGNOSIS — I10 Essential (primary) hypertension: Secondary | ICD-10-CM | POA: Diagnosis not present

## 2017-08-18 DIAGNOSIS — E785 Hyperlipidemia, unspecified: Secondary | ICD-10-CM

## 2017-08-18 DIAGNOSIS — Z716 Tobacco abuse counseling: Secondary | ICD-10-CM | POA: Diagnosis not present

## 2017-08-18 DIAGNOSIS — E669 Obesity, unspecified: Secondary | ICD-10-CM

## 2017-08-18 NOTE — Patient Instructions (Addendum)
No changes with current medications  Decrease to aspirin 81 mg    Your physician recommends that you schedule a follow-up appointment on an as needed basis

## 2017-08-18 NOTE — Progress Notes (Signed)
PCP: Harlan Stains, MD  Clinic Note: Chief Complaint  Patient presents with  . Follow-up    No real cardiac symptoms.    HPI: Andrea Cantrell is a 67 y.o. female with a PMH below who presents today for annual f/u for multiple CRFs. . She has a history of discoid lupus, fibromyalgia, hypertension and hyperlipidemia as well as multiple sclerosis. She has chronic pain. I saw her back in October 2015 consultation for chest pain with exertional dyspnea. She never followed back up again after stress test. She basically describes spiking chest pain sensations on the left upper chest under her breast.  Myoview 11/28/2013: NORMAL STRESS TEST. LOW RISK. No ischemia or infarction. Normal LV function. EF 71% Andrea Cantrell was last seen on Jun 17, 2016.  This was routine follow-up and she is doing quite fine.  She was limited as far as activity goes by knee pain and was having treatment by her orthopedic surgeon.  She noted a little bit of exertional dyspnea but not chest tightness or pressure.  Occasional chest discomfort with wheezing.  Also occasional flip-flopping sensation in her chest.  But nothing significant.  Recent Hospitalizations: None since hernia surgery back in October 2018  Studies Personally Reviewed - (if available, images/films reviewed: From Epic Chart or Care Everywhere)  2D echo July 29, 2016: (To evaluate murmur).  Normal LV size and function.  EF 60-65%.  No RWMA.  Normal diastolic function. No comment on valvular disease.  Interval History:   Andrea Cantrell returns today really doing fine from a cardiac standpoint.  She still has occasional left arm pain and numbness and starts her back and goes to her left arm.  Not associated with activity.  Comes and goes off and on.  It can last all day or last few seconds.  She still has occasional palpitations off and on but nothing prolonged.  She really does not note exertional chest tightness pressure. She says it doing any Walking is  laborious for her with her knee pain and therefore she does have some exertional dyspnea, but that is nothing new.  She denies any exertional chest tightness or pressure however. No PND, orthopnea or edema.  Occasional rare palpitations, but no n rapid irregular heartbeats.   No lightheadedness, dizziness, weakness or syncope/near syncope, or TIA/amaurosis fugax symptoms.  No claudication.  ROS: A comprehensive was performed. Review of Systems  HENT: Negative for congestion and nosebleeds.   Respiratory: Positive for shortness of breath (If she overdoes activity). Negative for cough and wheezing.   Cardiovascular: Positive for leg swelling (Off and on).  Gastrointestinal: Negative for abdominal pain, blood in stool and melena.  Genitourinary: Negative for hematuria.       Abdominal hernia surgery back in October.  Musculoskeletal: Positive for back pain and joint pain (Bilateral knees and hips.).  Neurological: Negative for dizziness.  Endo/Heme/Allergies: Positive for environmental allergies. Does not bruise/bleed easily.  Psychiatric/Behavioral: Negative for memory loss. The patient is not nervous/anxious and does not have insomnia.    I have reviewed and (if needed) personally updated the patient's problem list, medications, allergies, past medical and surgical history, social and family history.   Past Medical History:  Diagnosis Date  . Discoid lupus   . Essential hypertension   . Fibromyalgia   . Heavy smoker (more than 20 cigarettes per day)    Was able to stop for 11 years but restarted about 10 years ago  . Hyperlipidemia with target LDL less than  130   . MS (multiple sclerosis) ([place])   . Obesity (BMI 35.0-39.9 without comorbidity)   . Spinal stenosis     Past Surgical History:  Procedure Laterality Date  . ABDOMINAL HYSTERECTOMY  1995  . BREAST SURGERY Left    tissue removal  . [place]  . CHOLECYSTECTOMY  1994  . INCISIONAL HERNIA REPAIR N/A  12/09/2016   Procedure: LAPAROSCOPIC INCISIONAL HERNIA REPAIR WITH MESH;  Surgeon: Excell Seltzer, MD;  Location: [facility];  Service: General;  Laterality: N/A;  . NM MYOVIEW LTD  October 2015   EF 71%. No ischemia or infarction. Low risk/normal  . ROTATOR CUFF REPAIR Left June 2016  . TRANSTHORACIC ECHOCARDIOGRAM  07/2016    (To evaluate murmur).  Normal LV size and function.  EF 60-65%.  No RWMA.  Normal diastolic function. No comment on valvular disease. -- NORMAL ECHO    Current Meds  Medication Sig  . albuterol (PROVENTIL HFA;VENTOLIN HFA) 108 (90 BASE) MCG/ACT inhaler Inhale 2 puffs into the lungs every 6 (six) hours as needed for wheezing or shortness of breath (wheezing and shortness of breath). For shortness of breath.  Marland Kitchen amLODipine (NORVASC) 5 MG tablet Take 5 mg by mouth every morning.   Marland Kitchen aspirin EC 81 MG tablet Take 81 mg by mouth every evening.   Marland Kitchen atorvastatin (LIPITOR) 10 MG tablet Take 10 mg by mouth every morning.   . baclofen (LIORESAL) 10 MG tablet Take 1 tablet (10 mg total) by mouth 4 (four) times daily.  Marland Kitchen gabapentin (NEURONTIN) 800 MG tablet Take 1 tablet (800 mg total) by mouth 3 (three) times daily.  Marland Kitchen ibuprofen (ADVIL,MOTRIN) 200 MG tablet Take 600 mg by mouth every 6 (six) hours as needed for moderate pain.  . Iron-Vitamins (GERITOL COMPLETE) TABS Take by mouth.  . polyethylene glycol (MIRALAX / GLYCOLAX) packet Uses as needed for constipation at home.  He can buy this over-the-counter at any drugstore.  Follow package instructions.  . TECFIDERA 240 MG CPDR TAKE ONE CAPSULE (240 MG) BY MOUTH TWICE DAILY. STORE IN ORIGINAL CONTAINER AT ROOM TEMPERATURE. DO NOT CRUSH OR CHEW. SWALLOW WHOLE  . traMADol (ULTRAM) 50 MG tablet TAKE 1 TABLET BY MOUTH EVERY 6 HOURS AS NEEDED  . VITAMIN D, CHOLECALCIFEROL, PO Take 10,000 Units by mouth daily.     No Known Allergies  Social History   Tobacco Use  . Smoking status: Current Every Day Smoker    Packs/day: 1.00     Types: Cigarettes  . Smokeless tobacco: Never Used  Substance Use Topics  . Alcohol use: No  . Drug use: No   Social History   Social History Narrative   Patient lives at home with granddaughter her her 2 children.    Patient is separated. Patient has 2 children.    Patient has some college. Patient is on Disability.    Smokes roughly one pack a day. No alcohol.  --She is a bit, because 1 of her granddaughters living with her and her son was recently murdered (May 2019) in a domestic dispute (ongoing investigation)  family history includes Cancer in her father and mother; Diabetes in her brother and maternal grandmother.  Wt Readings from Last 3 Encounters:  08/18/17 163 lb 12.8 oz (74.3 kg)  04/13/17 177 lb 8 oz (80.5 kg)  12/09/16 188 lb (85.3 kg)    PHYSICAL EXAM BP 133/71   Pulse 97   Ht 5\' 2"  (1.575 m)   Wt  163 lb 12.8 oz (74.3 kg)   BMI 29.96 kg/m   Physical Exam  Constitutional: She is oriented to person, place, and time. She appears well-developed and well-nourished. No distress.  Borderline obese.  Well-groomed.  HENT:  Head: Normocephalic and atraumatic.  Neck: Normal range of motion. Neck supple. No hepatojugular reflux and no JVD present. Carotid bruit is not present.  Cardiovascular: Normal rate, regular rhythm, intact distal pulses and normal pulses.  No extrasystoles are present. PMI is not displaced. Exam reveals no gallop and no friction rub.  Murmur heard.  Harsh crescendo-decrescendo early systolic murmur is present with a grade of 1/6 at the upper right sternal border. Pulmonary/Chest: Effort normal and breath sounds normal. No respiratory distress. She has no wheezes.  Mild diffuse interstitial sounds, but no rales or rhonchi..  Abdominal: Soft. Bowel sounds are normal. She exhibits no distension. There is no tenderness.  No HSM  Musculoskeletal: Normal range of motion. She exhibits edema (Trivial ).  Neurological: She is alert and oriented to person,  place, and time.  Psychiatric: She has a normal mood and affect. Her behavior is normal. Judgment and thought content normal.    Adult ECG Report Not checked  Other studies Reviewed: Additional studies/ records that were reviewed today include:  Recent Labs: Jul 16, 2017  TC 169, TG 64, HDL 87, LDL 69.  Random glucose 98.  BUN 8, CR 0.6.  TSH 1.35.  Relatively normal    ASSESSMENT / PLAN: Problem List Items Addressed This Visit    Tobacco abuse counseling (Chronic)    Discussed importance of smoking cessation in order to avoid progression of cardiovascular disease. She is intubated service and is, we did not want to detail because she did not seem interested.      Obesity (BMI 35.0-39.9 without comorbidity) ([place]) (Chronic)    She is doing a good job apparently with some diet and exercise, and appears to be losing weight.  She is not sure if this is on purpose.  With current weight loss, no longer "obese "      Hyperlipidemia with target LDL less than 130 - Primary (Chronic)    On low-dose atorvastatin with excellent control.  She has been on Zetia and fenofibrate in the past, there is no longer on board and her triglycerides are fine.  Defer further care to PCP.      Essential hypertension (Chronic)    Stable.  Well-controlled on amlodipine.          Current medicines are reviewed at length with the patient today.  (+/- concerns) none The following changes have been made:  decrease ASA to 81 mg.  Patient Instructions  No changes with current medications  Decrease to aspirin 81 mg    Your physician recommends that you schedule a follow-up appointment on an as needed basis     Studies Ordered:   No orders of the defined types were placed in this encounter.     Glenetta Hew, M.D., M.S. Interventional Cardiologist   Pager # [phone] Phone # [phone] [address] [place], [place] [address]   Thank you for choosing Heartcare at  [facility]!!

## 2017-08-21 ENCOUNTER — Encounter: Payer: Self-pay | Admitting: Cardiology

## 2017-08-21 NOTE — Assessment & Plan Note (Signed)
Discussed importance of smoking cessation in order to avoid progression of cardiovascular disease. She is intubated service and is, we did not want to detail because she did not seem interested.

## 2017-08-21 NOTE — Assessment & Plan Note (Signed)
On low-dose atorvastatin with excellent control.  She has been on Zetia and fenofibrate in the past, there is no longer on board and her triglycerides are fine.  Defer further care to PCP.

## 2017-08-21 NOTE — Assessment & Plan Note (Signed)
Stable.  Well-controlled on amlodipine.

## 2017-08-21 NOTE — Assessment & Plan Note (Addendum)
She is doing a good job apparently with some diet and exercise, and appears to be losing weight.  She is not sure if this is on purpose.  With current weight loss, no longer "obese "

## 2017-08-30 ENCOUNTER — Other Ambulatory Visit: Payer: Self-pay | Admitting: Neurology

## 2017-08-30 NOTE — Telephone Encounter (Signed)
Rx registry checked. Last fill date is 08/02/17 for #90. Last OV was 04/13/17 and next OV is 04/19/18. Please refill in Dr. Rhea Belton absence.

## 2017-08-31 NOTE — Telephone Encounter (Signed)
Rx sent electronically.  

## 2017-09-10 DIAGNOSIS — M25561 Pain in right knee: Secondary | ICD-10-CM | POA: Diagnosis not present

## 2017-09-10 DIAGNOSIS — M25562 Pain in left knee: Secondary | ICD-10-CM | POA: Diagnosis not present

## 2017-09-17 DIAGNOSIS — M25561 Pain in right knee: Secondary | ICD-10-CM | POA: Diagnosis not present

## 2017-09-24 DIAGNOSIS — M25561 Pain in right knee: Secondary | ICD-10-CM | POA: Diagnosis not present

## 2017-10-01 DIAGNOSIS — M25561 Pain in right knee: Secondary | ICD-10-CM | POA: Diagnosis not present

## 2017-11-30 ENCOUNTER — Other Ambulatory Visit: Payer: Self-pay | Admitting: Neurology

## 2018-01-13 DIAGNOSIS — H538 Other visual disturbances: Secondary | ICD-10-CM | POA: Diagnosis not present

## 2018-01-13 DIAGNOSIS — I10 Essential (primary) hypertension: Secondary | ICD-10-CM | POA: Diagnosis not present

## 2018-01-13 DIAGNOSIS — R42 Dizziness and giddiness: Secondary | ICD-10-CM | POA: Diagnosis not present

## 2018-01-21 DIAGNOSIS — G35 Multiple sclerosis: Secondary | ICD-10-CM | POA: Diagnosis not present

## 2018-01-21 DIAGNOSIS — M48061 Spinal stenosis, lumbar region without neurogenic claudication: Secondary | ICD-10-CM | POA: Diagnosis not present

## 2018-01-21 DIAGNOSIS — Z23 Encounter for immunization: Secondary | ICD-10-CM | POA: Diagnosis not present

## 2018-01-21 DIAGNOSIS — I10 Essential (primary) hypertension: Secondary | ICD-10-CM | POA: Diagnosis not present

## 2018-01-21 DIAGNOSIS — E785 Hyperlipidemia, unspecified: Secondary | ICD-10-CM | POA: Diagnosis not present

## 2018-01-21 DIAGNOSIS — E559 Vitamin D deficiency, unspecified: Secondary | ICD-10-CM | POA: Diagnosis not present

## 2018-01-21 DIAGNOSIS — F4321 Adjustment disorder with depressed mood: Secondary | ICD-10-CM | POA: Diagnosis not present

## 2018-02-07 ENCOUNTER — Other Ambulatory Visit: Payer: Self-pay | Admitting: Adult Health

## 2018-02-11 ENCOUNTER — Other Ambulatory Visit: Payer: Self-pay | Admitting: Neurology

## 2018-02-21 DIAGNOSIS — H40033 Anatomical narrow angle, bilateral: Secondary | ICD-10-CM | POA: Diagnosis not present

## 2018-02-21 DIAGNOSIS — H2513 Age-related nuclear cataract, bilateral: Secondary | ICD-10-CM | POA: Diagnosis not present

## 2018-04-03 ENCOUNTER — Other Ambulatory Visit: Payer: Self-pay | Admitting: Neurology

## 2018-04-11 DIAGNOSIS — H43393 Other vitreous opacities, bilateral: Secondary | ICD-10-CM | POA: Diagnosis not present

## 2018-04-19 ENCOUNTER — Ambulatory Visit: Payer: PPO | Admitting: Adult Health

## 2018-05-04 ENCOUNTER — Other Ambulatory Visit: Payer: Self-pay | Admitting: Neurology

## 2018-05-05 ENCOUNTER — Telehealth (INDEPENDENT_AMBULATORY_CARE_PROVIDER_SITE_OTHER): Payer: PPO | Admitting: Neurology

## 2018-05-05 ENCOUNTER — Telehealth: Payer: Self-pay | Admitting: *Deleted

## 2018-05-05 DIAGNOSIS — M545 Low back pain, unspecified: Secondary | ICD-10-CM

## 2018-05-05 DIAGNOSIS — G35 Multiple sclerosis: Secondary | ICD-10-CM | POA: Diagnosis not present

## 2018-05-05 MED ORDER — MELOXICAM 7.5 MG PO TABS: 7.5000 mg | ORAL_TABLET | ORAL | 6 refills | Status: DC | PRN

## 2018-05-05 MED ORDER — TRAMADOL HCL 50 MG PO TABS: 50.0000 mg | ORAL_TABLET | Freq: Two times a day (BID) | ORAL | 3 refills | Status: DC | PRN

## 2018-05-05 MED ORDER — GABAPENTIN 300 MG PO CAPS: 300.0000 mg | ORAL_CAPSULE | Freq: Three times a day (TID) | ORAL | 11 refills | Status: DC

## 2018-05-05 NOTE — Telephone Encounter (Addendum)
Chief Complains/Reason for telephone visit: Question about her medications, patient is consent for telephone visit.  Names of all people present and their role: Patient  Relevant History: HISTORY Andrea Cantrell): [name], is a 68 year old female returns for followup of relapsing remitting multiple sclerosis  She has a history of multiple sclerosis since 1990s, also with past medical history of hypertension, hyperlipidemia, fibromyalgia, and discoid lupus in the 1990s. She rarely has a flareup from her lupus   She has episodes of gait difficulty, in 2006, took about 6 months to recover. From then on, she kept on having episodes of worsening gait difficulty, blurry vision. Diagnosis was made in 2013, based upon abnormal MRI.  MRI scan of the brain shows multiple periventricular lesions solitary enhancing left frontal subcortical lesion with multiple nonenhancing periventricular and subcortical hypointense lesions consistent with myelinating disease.   MRI of the cervical spine with mild degenerative changes but no enhancing lesions are noted.   She was started ontecfidera in October 2013and has tolerated the medication extremely well. Initially she thought she developed a rash but apparently that was due to a GI virus and that has disappeared.   UPDATE June 10th 2015:  She still complains of low back, lower extremity pain, muscle spasm at her toes, she went to hospital in Jul 05 2013, complains of chest tightness, chest pain, near syncope episode, EKG was normal, chest x-ray was normal, laboratory showed normal CBC, CMP with exception of mild elevated alkaline phosphate 140, she was diagnosed with dehydration, which has improved with IV fluid, and pain medications, normal nuclear stress test in October 2015  She now complains of left neck pain, bilateral lower extremity pain  UPDATE Feb 8th 2016: She complains of right knee pain, right foot numbness since Mar 18 2014, going up her  right leg, when she put pressure on her right foot, she noticed right toe pain, going up her right leg, She also complains of low back pain, going down her left hip and left leg, she also has right arm intermittent sharp pain.  She went to ED in Dec 27th 2015 for worsening left side low back pain, shooting pain to left leg.   MRI lumbar in February 2016, severe facet arthrosis at L4-5 with new grade 1 anterolisthesis and severe spinal stenosis. Moderate multifactorial spinal stenosis and moderate left neural foraminal stenosis at L3-4.  She has developed severe right shoulder pain in March 2016, hematoma at right forearm, MRI right shoulder March 24th 2016: Complete tear of the supraspinatus tendon with 3.4 cm of retraction. Complete tear of the intraarticular portion of the long head of the biceps tendon. Mild osteoarthritis of the glenohumeral joint. Moderate degenerative changes of the acromioclavicular joint.  She will have right rotator cuff surgery in July 23 2014 by Dr. Veverly Fells, has stopped aspirin in June first 2016, ibuprofen 5 days prior to that.  She also complains of chronic low back pain, worsening with prolonged walking, or bearing weight, is taking tramadol 50 mg 2 tablets twice a day, which has been effective.  UPDATE Dec 5th 2016: She did have right rotator cuff surgery in June of 2016 by orthopedic surgeon Dr. Veverly Fells, recovered very well, she continue have low back pain, radiating pain to bilateral lower extremity, especially early morning, she denies bowel and bladder incontinence, no significant gait difficulty, but complains of increased low back pain, radiating pain to bilateral lower extremity and lack of stamina We have personally reviewed MRI of lumbar spine in  February 2016, multilevel degenerative changes, most severe at L4-5, with moderate to severe spinal canal stenosis  UPDATE October 07 2016: Repeat laboratory in May 2018 showed normal hemoglobin 13.5,She complains  of worsening bilateral knee pain, gait abnormality,  is seeing orthopedic surgeon, from  she is stable  UPDATE Mar 05 2017:  Since last visit in August, she was admitted to the hospital in October 2018, for partial small bowel obstruction of small intestine lower midline incisional hernia, she underwent laparoscopic incisional hernia repair with mesh on December 09, 2016, regained slow recovery,  Since November 2018, she noticed intermittent left arm and leg numbness, mild gait abnormality, EMG nerve conduction study today showed no significant abnormality, there is no evidence of left upper extremity neuropathy or cervical radiculopathy.  UPDATE Apr 13 2017: She complains of left knee pain, gait abnormality due to knee pain, I have personally reviewed MRI of the brain with and without contrast in January 2019, there is evidence of multiple T2/FLAIR hyperintensity signal in the periventricular, cortical, pericallosal, deep white matter of both hemisphere, in the pons, in a pattern and configuration consistent with chronic demyelinating disease, no contrast enhancement, no change compared to previous 12/06/2015,  She also complains of worsening low back pain, have CT myelogram pending by her orthopedic surgeon.  Telephone visit May 05 2018: She missed the scheduled follow-up appointment on April 19, 2018, last visit was on April 13, 2017, she denies flareup of MS symptoms, but complains of significant low back pain, left knee pain, she did have CT myelogram in March 2019, showed multilevel degenerative changes, but I do not have formal report  Also had x-ray of left knee in June 2019, with no acute osseous abnormality, or significant degenerative changes,  She has stopped previous gabapentin 800 mg 3 times daily, has been using tramadol 50 mg 2-3 times as needed, she also experienced traumatic event at home, her granddaughter was murdered, she is still going through grieving.  She is taking  Tecfidera to 40 mg twice a day, overall tolerating it well.   Results Reviewed: Laboratory evaluations in October 2018 normal CMP, creatinine of 0.55, CBC, hemoglobin of 10.2, mildly decreased  Assessment and Plan: Relapsing Remitting Multiple Sclerosis  Continue Tecfidera to 40 mg twice a day  Repeat laboratory evaluation, order was placed for CMP, CBC, TSH  Low back pain, left knee pain,  Most likely musculoskeletal in,  I have advised her decreased the use of tramadol, 50 mg 60 tablets refill was provided,  Also advised her heating pad, Mobic as needed, continue follow-up with orthopedic surgeon   Gabapentin 300 mg 3 times daily  Documentation of Time of Medical Discussion:  Total of 13 mintues  [id number] minutes of medical discussion

## 2018-05-05 NOTE — Addendum Note (Signed)
Addended by: Marcial Pacas on: 05/05/2018 01:16 PM   Modules accepted: Orders

## 2018-05-05 NOTE — Telephone Encounter (Signed)
We received a Tramadol refill request from the patient.  Per the [place] narcotic registry and our records in [place], she just received #90 on 04/14/2018.    I called the patient to see how she is taking the medication.  She is using 3-4 tablets of Tramadol daily.  States her sciatica and knee pain are the primary reasons for the medication.  She has been unable to see her orthopaedic doctor yet.  Now she is afraid to go out because she is immunocompromised.  She has previously been on gabapentin 800mg  TID but has not taken it in several months. She no longer has a prescription.  She has also been on duloxetine 60mg  in the past but stopped it due to the fear of interaction with Tramadol (worried about seizures).  She is using her baclofen 10mg , 2-3 tablets per day, which helps.  She has not been seen in the last year here but knows she needs to come in.  States her 56 year old granddaughter was stabbed to death on 28-Jun-2017 and she has been grieving. She plans to make an appt once the Covid-19 precautions pass.  She is willing to accept any medication plan that Dr. Krista Blue feels is appropriate.

## 2018-05-10 IMAGING — CT CT L SPINE W/ CM
1 of 7 series · 6 of 14 positions shown, 8 images · non-contrast
Comparison: 04/13/2014 lumbar spine MRI

CLINICAL DATA: Low back pain and bilateral lower extremity pain.
TECHNIQUE: Contiguous axial images were obtained through the Lumbar spine after
the intrathecal infusion of infusion. Coronal and sagittal
reconstructions were obtained of the axial image sets.

[Series 3: l spine soft · axial · 0.33mm/px · z∈[-306,-171]mm · 6 of 64 slices shown, 8 images]
[im 10/64  soft-tissue]
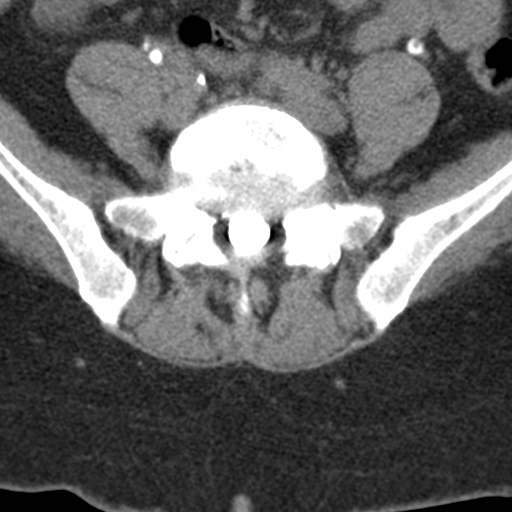
[im 10/64  bone]
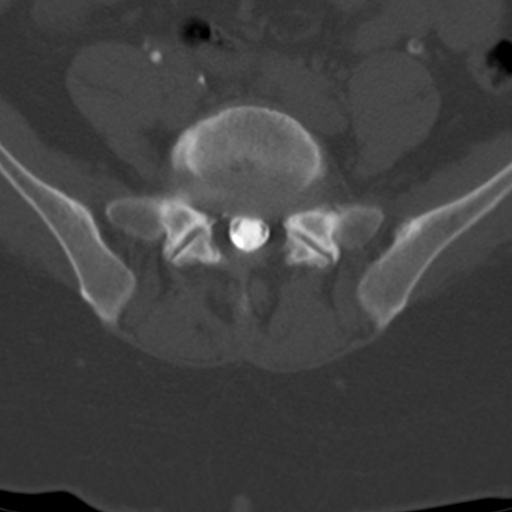
[im 19/64  bone]
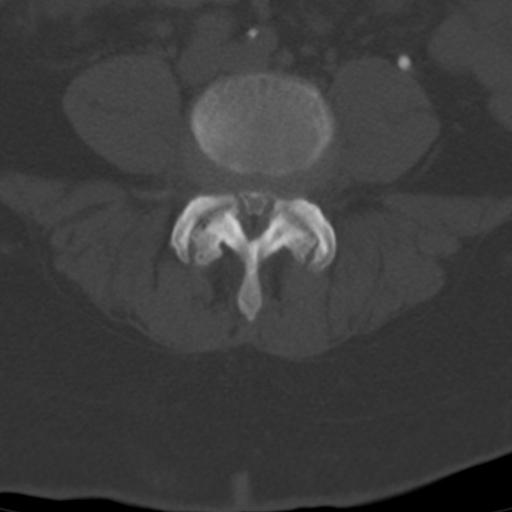
[im 28/64  bone]
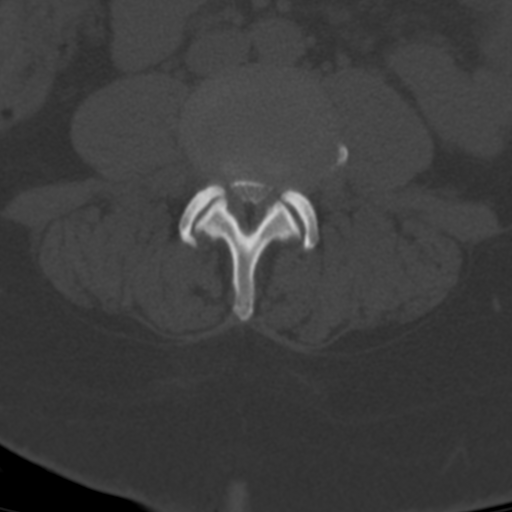
[im 37/64  bone]
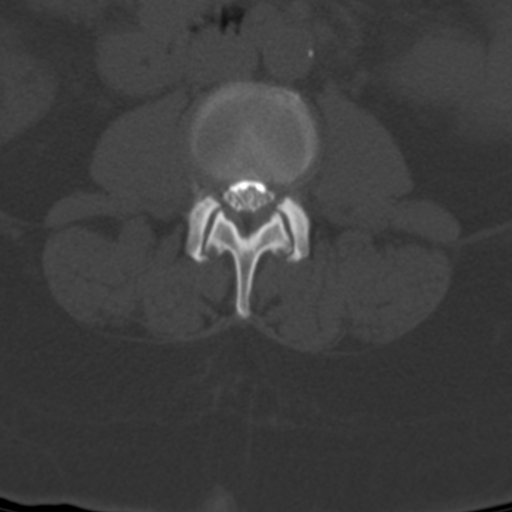
[im 46/64  soft-tissue]
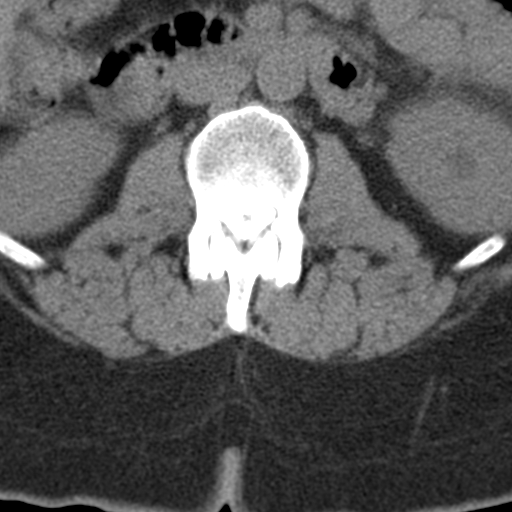
[im 46/64  bone]
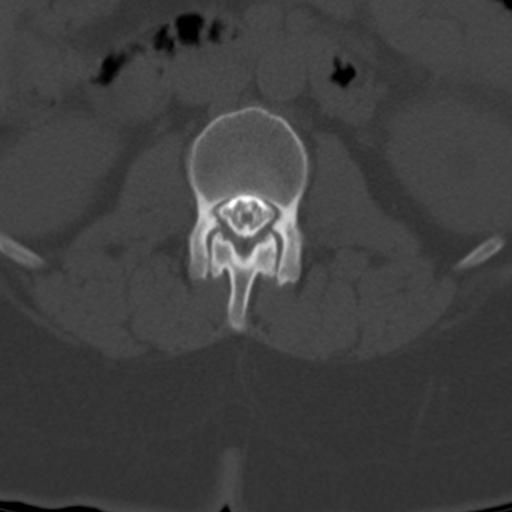
[im 55/64  bone]
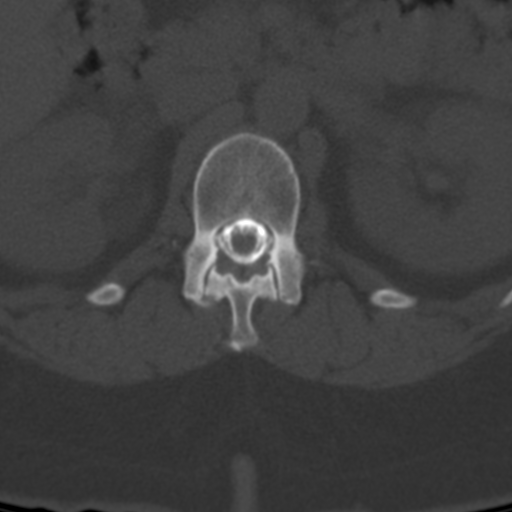

[6 of 14 positions shown; findings below may reference images not displayed]

EXAM:
LUMBAR MYELOGRAM

FLUOROSCOPY TIME:  Radiation Exposure Index (as provided by the
fluoroscopic device): 226.44 microGray*m^2

Fluoroscopy Time (in minutes and seconds):  16 seconds

PROCEDURE:
After thorough discussion of risks and benefits of the procedure
including bleeding, infection, injury to nerves, blood vessels,
adjacent structures as well as headache and CSF leak, written and
oral informed consent was obtained. Consent was obtained by Dr.
Tiburon Schutt. Time out form was completed.

Patient was positioned prone on the fluoroscopy table. Local
anesthesia was provided with 1% lidocaine without epinephrine after
prepped and draped in the usual sterile fashion. Puncture was
performed at L2-3 using a 5 inch 22-gauge spinal needle via a right
interlaminar approach. Using a single pass through the dura, the
needle was placed within the thecal sac, with return of clear CSF.
15 mL of Isovue 7-ETT was injected into the thecal sac, with normal
opacification of the nerve roots and cauda equina consistent with
free flow within the subarachnoid space.

I personally performed the lumbar puncture and administered the
intrathecal contrast. I also personally supervised acquisition of
the myelogram images.
FINDINGS: LUMBAR MYELOGRAM FINDINGS:

There is a mixed injection with subarachnoid and subdural
components, however the subarachnoid space is well opacified and the
diagnostic quality of the study is not significantly diminished.
There are 5 non rib-bearing lumbar type vertebrae. Grade 1
anterolisthesis of L4 on L5 does not significantly worsen with
standing and does not change with flexion or extension. Ventral
extradural defects are present at L3-4 and L4-5 with evidence of at
least moderate spinal stenosis at both levels, similar between prone
and upright positioning. There is effacement of the L5 greater than
L4 nerve root sleeves bilaterally.

CT LUMBAR MYELOGRAM FINDINGS:

Grade 1 anterolisthesis of L4 on L5 measures 4 mm, similar to the
prior MRI. No fracture or suspicious osseous lesion is identified.
Mild disc space narrowing from L3-4 to L5-S1 is unchanged. Vacuum
disc is noted at L5-S1. The conus medullaris terminates at L2.
Cholecystectomy clips, aortic atherosclerosis, and lower
abdominal/pelvic phleboliths are noted.

T12-L1: Mild facet hypertrophy without disc herniation or stenosis,
unchanged.

L1-2: Mild facet hypertrophy without disc herniation or stenosis,
unchanged.

L2-3: Mild facet hypertrophy without disc herniation or stenosis,
unchanged.

L3-4: Circumferential disc bulging greater to the left, congenitally
short pedicles, and moderate facet and ligamentum flavum hypertrophy
result in moderate spinal stenosis and mild right and moderate to
severe left neural foraminal stenosis, unchanged. A left
extraforaminal disc osteophyte complex may result in L3 nerve
impingement. There is slight heterogeneity of the left ligamentum
flavum at the site of the cyst on the prior MRI, however a residual
cyst is not clearly evident by CT.

L4-5: Anterolisthesis, bulging uncovered disc, congenitally short
pedicles, mild ligamentum flavum thickening, and severe facet
arthrosis result in severe spinal stenosis, bilateral lateral recess
stenosis, and moderate to severe right and mild to moderate left
neural foraminal stenosis, not significantly changed. Potential
right L4 and bilateral L5 nerve root impingement.

L5-S1: Circumferential disc bulging and moderate facet hypertrophy
result in moderate right greater than left neural foraminal stenosis
without spinal stenosis, unchanged.
IMPRESSION: 1. Severe L4-5 facet arthrosis with unchanged grade 1
anterolisthesis, severe spinal stenosis, and right greater than left
neural foraminal stenosis.
2. Unchanged moderate multifactorial spinal stenosis and moderate to
severe left neural foraminal stenosis at L3-4.
3. Unchanged moderate neural foraminal stenosis at L5-S1.
4.  Aortic Atherosclerosis ([id number].A).

## 2018-05-23 DIAGNOSIS — F325 Major depressive disorder, single episode, in full remission: Secondary | ICD-10-CM | POA: Diagnosis not present

## 2018-05-23 DIAGNOSIS — I10 Essential (primary) hypertension: Secondary | ICD-10-CM | POA: Diagnosis not present

## 2018-05-23 DIAGNOSIS — S46012A Strain of muscle(s) and tendon(s) of the rotator cuff of left shoulder, initial encounter: Secondary | ICD-10-CM | POA: Diagnosis not present

## 2018-05-23 DIAGNOSIS — G35 Multiple sclerosis: Secondary | ICD-10-CM | POA: Diagnosis not present

## 2018-05-23 DIAGNOSIS — E785 Hyperlipidemia, unspecified: Secondary | ICD-10-CM | POA: Diagnosis not present

## 2018-05-23 DIAGNOSIS — F17200 Nicotine dependence, unspecified, uncomplicated: Secondary | ICD-10-CM | POA: Diagnosis not present

## 2018-06-01 DIAGNOSIS — F17200 Nicotine dependence, unspecified, uncomplicated: Secondary | ICD-10-CM | POA: Diagnosis not present

## 2018-06-01 DIAGNOSIS — J069 Acute upper respiratory infection, unspecified: Secondary | ICD-10-CM | POA: Diagnosis not present

## 2018-06-23 ENCOUNTER — Telehealth: Payer: Self-pay | Admitting: Neurology

## 2018-06-23 NOTE — Telephone Encounter (Signed)
I called pt. Her knee and back are hurting. She feels that tramadol 50mg  BID is not enough and wants TID dosing. She has been unable to afford an orthopedic surgeon follow up. I advised her that Dr. Krista Blue preferred to decrease her tramadol usage as discussed in pt's appt on 06/14/18 with Dr. Krista Blue via telephone. Pt has not completed her bloodwork as ordered by Dr. Krista Blue. She will complete this on 06/30/18 at 10:30am.  She wants to know if Jinny Blossom, NP can address her concerns on 06/29/18 as scheduled. Pt is agreeable to trying a virtual visit, as long Megan, NP can address her concerns. If not, she is agreeable to rescheduling that appt.  I spoke with Jinny Blossom, NP. She recommends that since Dr. Krista Blue had a telephone visit with the pt on 06/14/18 (see telephone encounter docs from 05/05/18) that pt's appt with her be pushed out a few months. She will not be able to increase pt's tramadol if Dr. Krista Blue feels that it should be decreased.  I called pt, pt is agreeable to this. She will come in next week for blood work and her appt was moved to 09/21/18 at 10:30am with Jinny Blossom, NP.

## 2018-06-23 NOTE — Telephone Encounter (Signed)
Pt is asking if dr Krista Blue will write her a revised script to be able to take 3 a day of the traMADol (ULTRAM) 50 MG tablet.  Pt said she does not take them every day but sometimes in the evening she needs more than normal dosage

## 2018-06-29 ENCOUNTER — Ambulatory Visit: Payer: PPO | Admitting: Adult Health

## 2018-06-30 ENCOUNTER — Other Ambulatory Visit (INDEPENDENT_AMBULATORY_CARE_PROVIDER_SITE_OTHER): Payer: Self-pay

## 2018-06-30 ENCOUNTER — Other Ambulatory Visit: Payer: Self-pay

## 2018-06-30 DIAGNOSIS — G35 Multiple sclerosis: Secondary | ICD-10-CM | POA: Diagnosis not present

## 2018-06-30 DIAGNOSIS — M545 Low back pain, unspecified: Secondary | ICD-10-CM

## 2018-06-30 DIAGNOSIS — Z0289 Encounter for other administrative examinations: Secondary | ICD-10-CM

## 2018-07-01 LAB — COMPREHENSIVE METABOLIC PANEL
ALT: 21 IU/L (ref 0–32)
AST: 20 IU/L (ref 0–40)
Albumin/Globulin Ratio: 1.7 (ref 1.2–2.2)
Albumin: 4.5 g/dL (ref 3.8–4.8)
Alkaline Phosphatase: 112 IU/L (ref 39–117)
BUN/Creatinine Ratio: 27 (ref 12–28)
BUN: 17 mg/dL (ref 8–27)
Bilirubin Total: 0.6 mg/dL (ref 0.0–1.2)
CO2: 23 mmol/L (ref 20–29)
Calcium: 9.6 mg/dL (ref 8.7–10.3)
Chloride: 103 mmol/L (ref 96–106)
Creatinine, Ser: 0.63 mg/dL (ref 0.57–1.00)
GFR calc Af Amer: 107 mL/min/{1.73_m2} (ref >59)
GFR calc non Af Amer: 92 mL/min/{1.73_m2} (ref >59)
Globulin, Total: 2.7 g/dL (ref 1.5–4.5)
Glucose: 84 mg/dL (ref 65–99)
Potassium: 4.4 mmol/L (ref 3.5–5.2)
Sodium: 143 mmol/L (ref 134–144)
Total Protein: 7.2 g/dL (ref 6.0–8.5)

## 2018-07-01 LAB — CBC
Hematocrit: 41 % (ref 34.0–46.6)
Hemoglobin: 14.7 g/dL (ref 11.1–15.9)
MCH: 30.5 pg (ref 26.6–33.0)
MCHC: 35.9 g/dL — ABNORMAL HIGH (ref 31.5–35.7)
MCV: 85 fL (ref 79–97)
Platelets: 369 10*3/uL (ref 150–450)
RBC: 4.82 x10E6/uL (ref 3.77–5.28)
RDW: 14.1 % (ref 11.7–15.4)
WBC: 4.6 10*3/uL (ref 3.4–10.8)

## 2018-07-01 LAB — TSH: TSH: 1.2 u[IU]/mL (ref 0.450–4.500)

## 2018-07-13 ENCOUNTER — Telehealth: Payer: Self-pay

## 2018-07-13 NOTE — Telephone Encounter (Signed)
Unable to get in contact with the patient to move her to [id number] [facility] schedule. I left a voicemail asking her to return my call. Office number was provided   If patient calls back please r/s with Judson Roch for the same day as original appt.

## 2018-08-17 ENCOUNTER — Other Ambulatory Visit: Payer: Self-pay | Admitting: Adult Health

## 2018-08-17 ENCOUNTER — Other Ambulatory Visit: Payer: Self-pay | Admitting: Neurology

## 2018-08-27 ENCOUNTER — Other Ambulatory Visit: Payer: Self-pay | Admitting: Adult Health

## 2018-08-27 ENCOUNTER — Other Ambulatory Visit: Payer: Self-pay | Admitting: Neurology

## 2018-08-29 NOTE — Telephone Encounter (Signed)
Pt is calling in to check status of refill request

## 2018-09-14 ENCOUNTER — Other Ambulatory Visit: Payer: Self-pay | Admitting: *Deleted

## 2018-09-14 DIAGNOSIS — G35 Multiple sclerosis: Secondary | ICD-10-CM | POA: Diagnosis not present

## 2018-09-14 DIAGNOSIS — F325 Major depressive disorder, single episode, in full remission: Secondary | ICD-10-CM | POA: Diagnosis not present

## 2018-09-14 DIAGNOSIS — L930 Discoid lupus erythematosus: Secondary | ICD-10-CM | POA: Diagnosis not present

## 2018-09-14 DIAGNOSIS — Z0000 Encounter for general adult medical examination without abnormal findings: Secondary | ICD-10-CM | POA: Diagnosis not present

## 2018-09-14 DIAGNOSIS — I10 Essential (primary) hypertension: Secondary | ICD-10-CM | POA: Diagnosis not present

## 2018-09-14 DIAGNOSIS — E785 Hyperlipidemia, unspecified: Secondary | ICD-10-CM | POA: Diagnosis not present

## 2018-09-14 DIAGNOSIS — E2839 Other primary ovarian failure: Secondary | ICD-10-CM | POA: Diagnosis not present

## 2018-09-14 DIAGNOSIS — Z1389 Encounter for screening for other disorder: Secondary | ICD-10-CM | POA: Diagnosis not present

## 2018-09-14 DIAGNOSIS — I700 Atherosclerosis of aorta: Secondary | ICD-10-CM | POA: Diagnosis not present

## 2018-09-14 DIAGNOSIS — E559 Vitamin D deficiency, unspecified: Secondary | ICD-10-CM | POA: Diagnosis not present

## 2018-09-14 MED ORDER — BACLOFEN 10 MG PO TABS: 10.0000 mg | ORAL_TABLET | Freq: Three times a day (TID) | ORAL | 0 refills | Status: DC

## 2018-09-15 DIAGNOSIS — F4321 Adjustment disorder with depressed mood: Secondary | ICD-10-CM | POA: Diagnosis not present

## 2018-09-15 DIAGNOSIS — I10 Essential (primary) hypertension: Secondary | ICD-10-CM | POA: Diagnosis not present

## 2018-09-15 DIAGNOSIS — M1712 Unilateral primary osteoarthritis, left knee: Secondary | ICD-10-CM | POA: Diagnosis not present

## 2018-09-15 DIAGNOSIS — E785 Hyperlipidemia, unspecified: Secondary | ICD-10-CM | POA: Diagnosis not present

## 2018-09-15 DIAGNOSIS — D508 Other iron deficiency anemias: Secondary | ICD-10-CM | POA: Diagnosis not present

## 2018-09-15 DIAGNOSIS — F325 Major depressive disorder, single episode, in full remission: Secondary | ICD-10-CM | POA: Diagnosis not present

## 2018-09-21 ENCOUNTER — Ambulatory Visit: Payer: Self-pay | Admitting: Adult Health

## 2018-09-27 ENCOUNTER — Ambulatory Visit (INDEPENDENT_AMBULATORY_CARE_PROVIDER_SITE_OTHER): Payer: PPO | Admitting: Adult Health

## 2018-09-27 ENCOUNTER — Other Ambulatory Visit: Payer: Self-pay

## 2018-09-27 ENCOUNTER — Encounter: Payer: Self-pay | Admitting: Adult Health

## 2018-09-27 VITALS — BP 102/64 | HR 70 | Temp 97.3°F | Ht 63.0 in | Wt 174.4 lb

## 2018-09-27 DIAGNOSIS — M545 Low back pain, unspecified: Secondary | ICD-10-CM

## 2018-09-27 DIAGNOSIS — G35 Multiple sclerosis: Secondary | ICD-10-CM | POA: Diagnosis not present

## 2018-09-27 NOTE — Patient Instructions (Signed)
Your Plan:  Continue Tecfidera Continue Tramadol twice a day If your symptoms worsen or you develop new symptoms please let us know.    Thank you for coming to see [place] at [facility]. I hope we have been able to provide you high quality care today.  You may receive a patient satisfaction survey over the next few weeks. We would appreciate your feedback and comments so that we may continue to improve ourselves and the health of our patients.

## 2018-09-27 NOTE — Progress Notes (Signed)
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 She rarely has a flareup from her lupus   She has episodes of gait difficulty, in 2006, took about 6 months to recover. From then on, she kept on having episodes of worsening gait difficulty, blurry vision. Diagnosis was made in 2013, based upon abnormal MRI.  MRI scan of the brain shows multiple periventricular lesions solitary enhancing left frontal subcortical lesion with multiple nonenhancing periventricular and subcortical hypointense lesions consistent with myelinating disease.   MRI of the cervical spine with mild degenerative changes but no enhancing lesions are noted.   She was started ontecfidera in October 2013and has tolerated the medication extremely well. Initially she thought she developed a rash but apparently that was due to a GI virus and that has disappeared.   UPDATE June 10th 2015:  She still complains of low back, lower extremity pain, muscle spasm at her toes, she went to hospital in Jul 05 2013, complains of chest tightness, chest pain, near syncope episode, EKG was normal, chest x-ray was normal, laboratory showed normal CBC, CMP with exception of mild elevated alkaline phosphate 140, she was diagnosed with dehydration, which has improved with IV fluid, and pain medications, normal nuclear stress test in October 2015  She now complains of left neck pain, bilateral lower extremity pain  UPDATE Feb 8th 2016: She complains of right knee pain, right foot numbness since Mar 18 2014, going up her right leg, when she put pressure on her right foot, she noticed  right toe pain, going up her right leg, She also complains of low back pain, going down her left hip and left leg, she also has right arm intermittent sharp pain.  She went to ED in Dec 27th 2015 for worsening left side low back pain, shooting pain to left leg.   MRI lumbar in February 2016, severe facet arthrosis at L4-5 with new grade 1 anterolisthesis and severe spinal stenosis. Moderate multifactorial spinal stenosis and moderate left neural foraminal stenosis at L3-4.  She has developed severe right shoulder pain in March 2016, hematoma at right forearm, MRI right shoulder March 24th 2016: Complete tear of the supraspinatus tendon with 3.4 cm of retraction. Complete tear of the intraarticular portion of the long head of the biceps tendon. Mild osteoarthritis of the glenohumeral joint. Moderate degenerative changes of the acromioclavicular joint.  She will have right rotator cuff surgery in July 23 2014 by Dr. Veverly Fells, has stopped aspirin in June first 2016, ibuprofen 5 days prior to that.  She also complains of chronic low back pain, worsening with prolonged walking, or bearing weight, is taking tramadol 50 mg 2 tablets twice a day, which has been effective.  UPDATE Dec 5th 2016: She did have right rotator cuff surgery in June of 2016 by orthopedic surgeon Dr. Veverly Fells, recovered very well, she continue have low back pain, radiating pain to bilateral lower extremity, especially early morning, she denies bowel and bladder incontinence, no significant gait difficulty, but complains of increased low back pain, radiating pain to bilateral lower extremity and lack of stamina We have personally reviewed MRI of lumbar spine in February 2016, multilevel degenerative changes, most  severe at L4-5, with moderate to severe spinal canal stenosis  UPDATE October 07 2016: Repeat laboratory in May 2018 showed normal hemoglobin 13.5,She complains of worsening bilateral knee pain, gait abnormality,  is seeing  orthopedic surgeon, from  she is stable  UPDATE Mar 05 2017:  Since last visit in August, she was admitted to the hospital in October 2018, for partial small bowel obstruction of small intestine lower midline incisional hernia, she underwent laparoscopic incisional hernia repair with mesh on December 09, 2016, regained slow recovery,  Since November 2018, she noticed intermittent left arm and leg numbness, mild gait abnormality, EMG nerve conduction study today showed no significant abnormality, there is no evidence of left upper extremity neuropathy or cervical radiculopathy.  UPDATE Apr 13 2017: She complains of left knee pain, gait abnormality due to knee pain, I have personally reviewed MRI of the brain with and without contrast in January 2019, there is evidence of multiple T2/FLAIR hyperintensity signal in the periventricular, cortical, pericallosal, deep white matter of both hemisphere, in the pons, in a pattern and configuration consistent with chronic demyelinating disease, no contrast enhancement, no change compared to previous 12/06/2015,  She also complains of worsening low back pain, have CT myelogram pending by her orthopedic surgeon.  Today 09/27/18:  Andrea Cantrell is a 68 year old female with a history of multiple sclerosis.  She returns today for follow-up.  She remains on Tecfidera and tolerates it well.  She reports that she continues to have knee and back pain.  She has not followed up with her orthopedic due to a outstanding bill that she owes.  She states that her granddaughter was killed last year and she had to help support her son who was out of work due to grief.  She states that she is now back on track and hopefully can make a payment to follow-up with them.  She denies any new symptoms.  No changes with her gait or balance.  No new numbness or weakness.  She reports that her recent eye exam revealed cataracts.  She had a repeat MRI in January and lab work in April.   She returns today for an evaluation.   REVIEW OF SYSTEMS: Out of a complete 14 system review of symptoms, the patient complains only of the following symptoms, and all other reviewed systems are negative.  Numbness, joint pain, joint swelling, back pain, leg sensitivity, double vision, ringing in the ears  ALLERGIES: No Known Allergies  HOME MEDICATIONS: Outpatient Medications Prior to Visit  Medication Sig Dispense Refill   albuterol (PROVENTIL HFA;VENTOLIN HFA) 108 (90 BASE) MCG/ACT inhaler Inhale 2 puffs into the lungs every 6 (six) hours as needed for wheezing or shortness of breath (wheezing and shortness of breath). For shortness of breath.     amLODipine (NORVASC) 5 MG tablet Take 5 mg by mouth every morning.      aspirin EC 81 MG tablet Take 81 mg by mouth every evening.      atorvastatin (LIPITOR) 10 MG tablet Take 10 mg by mouth every morning.      baclofen (LIORESAL) 10 MG tablet Take 1 tablet (10 mg total) by mouth 3 (three) times daily. 90 tablet 0   gabapentin (NEURONTIN) 300 MG capsule Take 1 capsule (300 mg total) by mouth 3 (three) times daily. 90 capsule 11   ibuprofen (ADVIL,MOTRIN) 200 MG tablet Take 600 mg by mouth every 6 (six) hours as needed for moderate pain.     Iron-Vitamins (GERITOL COMPLETE) TABS  Take by mouth.     meloxicam (MOBIC) 7.5 MG tablet Take 1 tablet (7.5 mg total) by mouth as needed for pain. 30 tablet 6   polyethylene glycol (MIRALAX / GLYCOLAX) packet Uses as needed for constipation at home.  He can buy this over-the-counter at any drugstore.  Follow package instructions. 14 each 0   TECFIDERA 240 MG CPDR TAKE ONE CAPSULE BY MOUTH TWICE DAILY. STORE IN ORIGINAL CONTAINER AT ROOM TEMPERATURE. 180 capsule 3   traMADol (ULTRAM) 50 MG tablet Take 1 tablet by mouth twice daily as needed 60 tablet 3   VITAMIN D, CHOLECALCIFEROL, PO Take 10,000 Units by mouth daily.      No facility-administered medications prior to visit.     PAST MEDICAL  HISTORY: Past Medical History:  Diagnosis Date   Discoid lupus    Essential hypertension    Fibromyalgia    Heavy smoker (more than 20 cigarettes per day)    Was able to stop for 11 years but restarted about 10 years ago   Hyperlipidemia with target LDL less than 130    Andrea (multiple sclerosis) ([place])    Obesity (BMI 35.0-39.9 without comorbidity)    Spinal stenosis     PAST SURGICAL HISTORY: Past Surgical History:  Procedure Laterality Date   ABDOMINAL HYSTERECTOMY  1995   BREAST SURGERY Left    tissue removal   [place] N/A 12/09/2016   Procedure: LAPAROSCOPIC INCISIONAL HERNIA REPAIR WITH MESH;  Surgeon: Excell Seltzer, MD;  Location: [facility];  Service: General;  Laterality: N/A;   NM MYOVIEW LTD  October 2015   EF 71%. No ischemia or infarction. Low risk/normal   ROTATOR CUFF REPAIR Left June 2016   TRANSTHORACIC ECHOCARDIOGRAM  07/2016    (To evaluate murmur).  Normal LV size and function.  EF 60-65%.  No RWMA.  Normal diastolic function. No comment on valvular disease. -- NORMAL ECHO    FAMILY HISTORY: Family History  Problem Relation Age of Onset   Cancer Mother    Cancer Father    Diabetes Brother    Diabetes Maternal Grandmother     SOCIAL HISTORY: Social History   Socioeconomic History   Marital status: Legally Separated    Spouse name: Not on file   Number of children: 2   Years of education: 12+   Highest education level: Not on file  Occupational History   Not on file  Social Needs   Financial resource strain: Not on file   Food insecurity    Worry: Not on file    Inability: Not on file   Transportation needs    Medical: Not on file    Non-medical: Not on file  Tobacco Use   Smoking status: Current Every Day Smoker    Packs/day: 1.00    Types: Cigarettes   Smokeless tobacco: Never Used  Substance and Sexual Activity   Alcohol use: No    Drug use: No   Sexual activity: Not on file  Lifestyle   Physical activity    Days per week: Not on file    Minutes per session: Not on file   Stress: Not on file  Relationships   Social connections    Talks on phone: Not on file    Gets together: Not on file    Attends religious service: Not on file    Active member of club or organization: Not on file  Attends meetings of clubs or organizations: Not on file    Relationship status: Not on file   Intimate partner violence    Fear of current or ex partner: Not on file    Emotionally abused: Not on file    Physically abused: Not on file    Forced sexual activity: Not on file  Other Topics Concern   Not on file  Social History Narrative   Patient lives at home with granddaughter her her 2 children.    Patient is separated. Patient has 2 children.    Patient has some college. Patient is on Disability.    Smokes roughly one pack a day. No alcohol.      PHYSICAL EXAM  Vitals:   09/27/18 1113  BP: 102/64  Pulse: 70  Temp: (!) 97.3 F (36.3 C)  TempSrc: Oral  Weight: 174 lb 6.4 oz (79.1 kg)  Height: 5\' 3"  (1.6 m)   Body mass index is 30.89 kg/m.  Generalized: Well developed, in no acute distress   Neurological examination  Mentation: Alert oriented to time, place, history taking. Follows all commands speech and language fluent Cranial nerve II-XII: . Extraocular movements were full, visual field were full on confrontational test. Head turning and shoulder shrug  were normal and symmetric. Motor: The motor testing reveals 5 over 5 strength of all 4 extremities. Good symmetric motor tone is noted throughout.  Sensory: Sensory testing is intact to soft touch on all 4 extremities. No evidence of extinction is noted.  Coordination: Cerebellar testing reveals good finger-nose-finger and heel-to-shin bilaterally.  Gait and station: Gait is normal. Reflexes: Deep tendon reflexes are symmetric but depressed  throughout  DIAGNOSTIC DATA (LABS, IMAGING, TESTING) - I reviewed patient records, labs, notes, testing and imaging myself where available.  Lab Results  Component Value Date   WBC 4.6 06/30/2018   HGB 14.7 06/30/2018   HCT 41.0 06/30/2018   MCV 85 06/30/2018   PLT 369 06/30/2018      Component Value Date/Time   NA 143 06/30/2018 1038   K 4.4 06/30/2018 1038   CL 103 06/30/2018 1038   CO2 23 06/30/2018 1038   GLUCOSE 84 06/30/2018 1038   GLUCOSE 91 12/11/2016 0507   BUN 17 06/30/2018 1038   CREATININE 0.63 06/30/2018 1038   CALCIUM 9.6 06/30/2018 1038   PROT 7.2 06/30/2018 1038   ALBUMIN 4.5 06/30/2018 1038   AST 20 06/30/2018 1038   ALT 21 06/30/2018 1038   ALKPHOS 112 06/30/2018 1038   BILITOT 0.6 06/30/2018 1038   GFRNONAA 92 06/30/2018 1038   GFRAA 107 06/30/2018 1038       ASSESSMENT AND PLAN 68 y.o. year old female  has a past medical history of Discoid lupus, Essential hypertension, Fibromyalgia, Heavy smoker (more than 20 cigarettes per day), Hyperlipidemia with target LDL less than 130, Andrea (multiple sclerosis) ([place]), Obesity (BMI 35.0-39.9 without comorbidity), and Spinal stenosis. here with:  1.  Multiple sclerosis 2.  Low back pain  The patient will continue on Tecfidera.  She had blood work in May that was relatively unremarkable.  She will continue on tramadol twice a day for back pain.  She is encouraged to schedule a follow-up with her orthopedist.  She is advised that if her symptoms worsen or she develops new symptoms she should let us know.  She will follow-up in 6 months or sooner if needed.    Ward Givens, MSN, NP-C 09/27/2018, 11:04 AM [facility] [address]  [place], [place] [address] [phone]

## 2018-09-28 DIAGNOSIS — Z90710 Acquired absence of both cervix and uterus: Secondary | ICD-10-CM | POA: Diagnosis not present

## 2018-09-28 DIAGNOSIS — Z1231 Encounter for screening mammogram for malignant neoplasm of breast: Secondary | ICD-10-CM | POA: Diagnosis not present

## 2018-09-28 DIAGNOSIS — Z780 Asymptomatic menopausal state: Secondary | ICD-10-CM | POA: Diagnosis not present

## 2018-09-28 DIAGNOSIS — Z803 Family history of malignant neoplasm of breast: Secondary | ICD-10-CM | POA: Diagnosis not present

## 2018-09-28 DIAGNOSIS — R29890 Loss of height: Secondary | ICD-10-CM | POA: Diagnosis not present

## 2018-09-28 DIAGNOSIS — M8589 Other specified disorders of bone density and structure, multiple sites: Secondary | ICD-10-CM | POA: Diagnosis not present

## 2018-09-28 DIAGNOSIS — Z8262 Family history of osteoporosis: Secondary | ICD-10-CM | POA: Diagnosis not present

## 2018-09-28 NOTE — Progress Notes (Signed)
I have reviewed and agreed above plan. 

## 2018-10-05 DIAGNOSIS — D508 Other iron deficiency anemias: Secondary | ICD-10-CM | POA: Diagnosis not present

## 2018-10-05 DIAGNOSIS — F325 Major depressive disorder, single episode, in full remission: Secondary | ICD-10-CM | POA: Diagnosis not present

## 2018-10-05 DIAGNOSIS — E785 Hyperlipidemia, unspecified: Secondary | ICD-10-CM | POA: Diagnosis not present

## 2018-10-05 DIAGNOSIS — M1712 Unilateral primary osteoarthritis, left knee: Secondary | ICD-10-CM | POA: Diagnosis not present

## 2018-10-05 DIAGNOSIS — I10 Essential (primary) hypertension: Secondary | ICD-10-CM | POA: Diagnosis not present

## 2018-10-05 DIAGNOSIS — F4321 Adjustment disorder with depressed mood: Secondary | ICD-10-CM | POA: Diagnosis not present

## 2018-11-09 ENCOUNTER — Telehealth: Payer: Self-pay | Admitting: Adult Health

## 2018-11-09 MED ORDER — TRAMADOL HCL 50 MG PO TABS: 50.0000 mg | ORAL_TABLET | Freq: Two times a day (BID) | ORAL | 0 refills | Status: DC | PRN

## 2018-11-09 NOTE — Telephone Encounter (Signed)
Called wanting tramadol sent to [facility] since she has moved. I refilled this for Dr. Krista Blue

## 2018-11-09 NOTE — Telephone Encounter (Signed)
Drug registry last tramadol filled 08-29-18 #60.

## 2018-11-10 ENCOUNTER — Telehealth: Payer: Self-pay | Admitting: Neurology

## 2018-11-10 NOTE — Telephone Encounter (Signed)
I LMVM for pt that her tramadol rx was ordered for her.

## 2018-11-10 NOTE — Telephone Encounter (Signed)
Ward Givens, NP sent this refill in for the patient on 11/09/2018.  It was sent to the requested pharmacy below.  I called the patient to let her know.

## 2018-11-10 NOTE — Telephone Encounter (Signed)
Pt called wanting to know if her traMADol (ULTRAM) 50 MG tablet can be transferred to the [facility] in [place] on [address] Fax# [phone] Phone# [phone] due to her not being able to get it delivered to her in [place]. Pt had a death in the family and will be there till further notice. Please advise.

## 2018-11-16 DIAGNOSIS — M1712 Unilateral primary osteoarthritis, left knee: Secondary | ICD-10-CM | POA: Diagnosis not present

## 2018-11-16 DIAGNOSIS — I10 Essential (primary) hypertension: Secondary | ICD-10-CM | POA: Diagnosis not present

## 2018-11-16 DIAGNOSIS — D508 Other iron deficiency anemias: Secondary | ICD-10-CM | POA: Diagnosis not present

## 2018-11-16 DIAGNOSIS — F325 Major depressive disorder, single episode, in full remission: Secondary | ICD-10-CM | POA: Diagnosis not present

## 2018-11-16 DIAGNOSIS — F4321 Adjustment disorder with depressed mood: Secondary | ICD-10-CM | POA: Diagnosis not present

## 2018-11-16 DIAGNOSIS — E785 Hyperlipidemia, unspecified: Secondary | ICD-10-CM | POA: Diagnosis not present

## 2018-11-29 ENCOUNTER — Other Ambulatory Visit: Payer: Self-pay

## 2018-11-29 DIAGNOSIS — Z20828 Contact with and (suspected) exposure to other viral communicable diseases: Secondary | ICD-10-CM | POA: Diagnosis not present

## 2018-11-29 DIAGNOSIS — Z20822 Contact with and (suspected) exposure to covid-19: Secondary | ICD-10-CM

## 2018-12-01 LAB — NOVEL CORONAVIRUS, NAA: SARS-CoV-2, NAA: NOT DETECTED

## 2018-12-08 ENCOUNTER — Other Ambulatory Visit: Payer: Self-pay | Admitting: Neurology

## 2018-12-08 ENCOUNTER — Other Ambulatory Visit: Payer: Self-pay | Admitting: Adult Health

## 2018-12-08 DIAGNOSIS — D508 Other iron deficiency anemias: Secondary | ICD-10-CM | POA: Diagnosis not present

## 2018-12-08 DIAGNOSIS — I10 Essential (primary) hypertension: Secondary | ICD-10-CM | POA: Diagnosis not present

## 2018-12-08 DIAGNOSIS — F4321 Adjustment disorder with depressed mood: Secondary | ICD-10-CM | POA: Diagnosis not present

## 2018-12-08 DIAGNOSIS — F325 Major depressive disorder, single episode, in full remission: Secondary | ICD-10-CM | POA: Diagnosis not present

## 2018-12-08 DIAGNOSIS — E785 Hyperlipidemia, unspecified: Secondary | ICD-10-CM | POA: Diagnosis not present

## 2018-12-08 DIAGNOSIS — M1712 Unilateral primary osteoarthritis, left knee: Secondary | ICD-10-CM | POA: Diagnosis not present

## 2018-12-08 NOTE — Telephone Encounter (Signed)
[id number] [facility] has called for a refill for pt for  traMADol (ULTRAM) 50 MG tablet [facility]

## 2018-12-08 NOTE — Telephone Encounter (Signed)
Drug registry checked last fill 11-10-18 #60. Tramadol.

## 2018-12-08 NOTE — Addendum Note (Signed)
Addended by: Brandon Melnick on: 12/08/2018 12:16 PM   Modules accepted: Orders

## 2018-12-12 MED ORDER — TRAMADOL HCL 50 MG PO TABS: 50.0000 mg | ORAL_TABLET | Freq: Two times a day (BID) | ORAL | 0 refills | Status: DC | PRN

## 2018-12-13 ENCOUNTER — Telehealth: Payer: Self-pay

## 2018-12-13 NOTE — Telephone Encounter (Signed)
Pending approval for Tecfidera 240 mg Key: [id number] Rx #: [id number]  I will update once a decision has been made.

## 2018-12-15 ENCOUNTER — Telehealth: Payer: Self-pay | Admitting: Adult Health

## 2018-12-15 NOTE — Telephone Encounter (Signed)
Wells Guiles from [facility] called wanting to know when the P.A. will be initiated for the Dimethyl-Fumarate. Please advise.

## 2018-12-15 NOTE — Telephone Encounter (Signed)
Paperwork has been signed by Ward Givens, NP and faxed bacl to [facility]. Confirmation fax has been received.

## 2018-12-15 NOTE — Telephone Encounter (Signed)
Form has been filled out pending the providers signature.

## 2018-12-19 NOTE — Telephone Encounter (Signed)
Received a approval on Tecfidera 240 mg through 02/16/2020. I copy has been sent to the patient's pharmacy listed below. Confirmation fax has been received.    [place], [place] [phone] (Phone) [phone] (Fax)

## 2018-12-22 DIAGNOSIS — M25362 Other instability, left knee: Secondary | ICD-10-CM | POA: Diagnosis not present

## 2018-12-22 DIAGNOSIS — M48061 Spinal stenosis, lumbar region without neurogenic claudication: Secondary | ICD-10-CM | POA: Diagnosis not present

## 2018-12-22 DIAGNOSIS — Z23 Encounter for immunization: Secondary | ICD-10-CM | POA: Diagnosis not present

## 2018-12-22 DIAGNOSIS — G35 Multiple sclerosis: Secondary | ICD-10-CM | POA: Diagnosis not present

## 2019-01-10 ENCOUNTER — Other Ambulatory Visit: Payer: Self-pay | Admitting: Adult Health

## 2019-01-11 ENCOUNTER — Other Ambulatory Visit: Payer: Self-pay | Admitting: *Deleted

## 2019-01-11 DIAGNOSIS — E785 Hyperlipidemia, unspecified: Secondary | ICD-10-CM | POA: Diagnosis not present

## 2019-01-11 DIAGNOSIS — D508 Other iron deficiency anemias: Secondary | ICD-10-CM | POA: Diagnosis not present

## 2019-01-11 DIAGNOSIS — M1712 Unilateral primary osteoarthritis, left knee: Secondary | ICD-10-CM | POA: Diagnosis not present

## 2019-01-11 DIAGNOSIS — F4321 Adjustment disorder with depressed mood: Secondary | ICD-10-CM | POA: Diagnosis not present

## 2019-01-11 DIAGNOSIS — F325 Major depressive disorder, single episode, in full remission: Secondary | ICD-10-CM | POA: Diagnosis not present

## 2019-01-11 DIAGNOSIS — I10 Essential (primary) hypertension: Secondary | ICD-10-CM | POA: Diagnosis not present

## 2019-01-11 NOTE — Telephone Encounter (Signed)
Order already sent to MM/NP on 01-10-19 is pended as yet.

## 2019-01-24 DIAGNOSIS — M25561 Pain in right knee: Secondary | ICD-10-CM | POA: Diagnosis not present

## 2019-01-24 DIAGNOSIS — M25562 Pain in left knee: Secondary | ICD-10-CM | POA: Diagnosis not present

## 2019-01-24 DIAGNOSIS — M1712 Unilateral primary osteoarthritis, left knee: Secondary | ICD-10-CM | POA: Diagnosis not present

## 2019-01-24 DIAGNOSIS — M1711 Unilateral primary osteoarthritis, right knee: Secondary | ICD-10-CM | POA: Diagnosis not present

## 2019-01-30 ENCOUNTER — Other Ambulatory Visit: Payer: Self-pay | Admitting: Adult Health

## 2019-01-30 NOTE — Telephone Encounter (Signed)
Pt is requesting a refill of traMADol (ULTRAM) 50 MG tablet, to be sent to [place], [place] - [address]

## 2019-01-30 NOTE — Telephone Encounter (Signed)
I called and LMVM for pt that [facility]  Last note from drug registry was 11-10-18.  She is to call back if questions.

## 2019-01-30 NOTE — Telephone Encounter (Signed)
I just refilled this on 11/25?

## 2019-01-30 NOTE — Telephone Encounter (Signed)
Drug registry checked. Tramadol 50mg  last filled 11-10-18 #60.

## 2019-01-31 ENCOUNTER — Other Ambulatory Visit: Payer: Self-pay | Admitting: Adult Health

## 2019-02-01 NOTE — Telephone Encounter (Signed)
LMVM for pt that did [facility] and you received 01-11-19 #60 tablets should be good until 02-10-19 taking 1po BID.  Please call back.

## 2019-02-01 NOTE — Telephone Encounter (Signed)
I called [facility] and Andrea Cantrell, relayed that pt last picked up prescription of tramadol 50mg  tablets #60 (take one po bid prn) on 01-11-19.  This is not on [place] [facility] registry.

## 2019-02-01 NOTE — Telephone Encounter (Signed)
Pt called in and stated that she took the last 2 tabs of Tramadol today. Pt states she has been taking them as scheduled instead of prn, she states she needs them everyday due to pain.

## 2019-02-02 ENCOUNTER — Ambulatory Visit: Payer: PPO | Attending: Internal Medicine

## 2019-02-02 DIAGNOSIS — U071 COVID-19: Secondary | ICD-10-CM | POA: Diagnosis not present

## 2019-02-02 DIAGNOSIS — R238 Other skin changes: Secondary | ICD-10-CM | POA: Diagnosis not present

## 2019-02-02 NOTE — Addendum Note (Signed)
Addended by: Brandon Melnick on: 02/02/2019 09:39 AM   Modules accepted: Orders

## 2019-02-02 NOTE — Telephone Encounter (Signed)
I called pt.  She states that she has taken tramadol as prescribed (2 daily) last doses yesterday.  She takes tylenol pm at night, 3-4 AM will take 400mg  motrin, gabapentin BID, no meloxicam (not like the way it makes her feel).  She is not sure if pharmacy did not give her enough, or what (she will start counting her tablets).  What can she do if not tramadol.  Are you ok to override and give her early? Please advise.

## 2019-02-02 NOTE — Telephone Encounter (Signed)
I called pt and LMVM for her that was f/u on message left yesterday.  Not due for refill until 02-10-19.  Refill 02-08-19 the earliest. She is to call back if needs to speak to [place].

## 2019-02-03 DIAGNOSIS — F325 Major depressive disorder, single episode, in full remission: Secondary | ICD-10-CM | POA: Diagnosis not present

## 2019-02-03 DIAGNOSIS — M1712 Unilateral primary osteoarthritis, left knee: Secondary | ICD-10-CM | POA: Diagnosis not present

## 2019-02-03 DIAGNOSIS — I10 Essential (primary) hypertension: Secondary | ICD-10-CM | POA: Diagnosis not present

## 2019-02-03 DIAGNOSIS — E785 Hyperlipidemia, unspecified: Secondary | ICD-10-CM | POA: Diagnosis not present

## 2019-02-03 DIAGNOSIS — F4321 Adjustment disorder with depressed mood: Secondary | ICD-10-CM | POA: Diagnosis not present

## 2019-02-03 DIAGNOSIS — D508 Other iron deficiency anemias: Secondary | ICD-10-CM | POA: Diagnosis not present

## 2019-02-03 LAB — NOVEL CORONAVIRUS, NAA: SARS-CoV-2, NAA: NOT DETECTED

## 2019-02-06 MED ORDER — TRAMADOL HCL 50 MG PO TABS: 50.0000 mg | ORAL_TABLET | Freq: Two times a day (BID) | ORAL | 0 refills | Status: DC | PRN

## 2019-02-06 NOTE — Telephone Encounter (Signed)
I called pt and LMVM for her on mobile that tramadol prescription sent in.

## 2019-02-06 NOTE — Addendum Note (Signed)
Addended by: Trudie Buckler on: 02/06/2019 11:44 AM   Modules accepted: Orders

## 2019-02-06 NOTE — Telephone Encounter (Signed)
Patient called back to check on the status of the tramadol request. Patient called to inform that the pharmacy will be close the 24th&25th of December and states they would need the order by wed. Morning in order to have sufficient time to fill and deliver the medication. Please follow up.

## 2019-02-21 DIAGNOSIS — M1711 Unilateral primary osteoarthritis, right knee: Secondary | ICD-10-CM | POA: Diagnosis not present

## 2019-02-21 DIAGNOSIS — M1712 Unilateral primary osteoarthritis, left knee: Secondary | ICD-10-CM | POA: Diagnosis not present

## 2019-02-27 ENCOUNTER — Other Ambulatory Visit: Payer: Self-pay | Admitting: Neurology

## 2019-03-06 ENCOUNTER — Other Ambulatory Visit: Payer: Self-pay | Admitting: Neurology

## 2019-03-06 ENCOUNTER — Other Ambulatory Visit: Payer: Self-pay | Admitting: Adult Health

## 2019-03-06 NOTE — Telephone Encounter (Signed)
[place] Database Verified LR: 02-07-2019 Qty: 60 Pending appointment: 04-05-2019

## 2019-03-09 ENCOUNTER — Other Ambulatory Visit: Payer: Self-pay | Admitting: *Deleted

## 2019-03-09 ENCOUNTER — Telehealth: Payer: Self-pay | Admitting: Adult Health

## 2019-03-09 MED ORDER — GABAPENTIN 300 MG PO CAPS: 300.0000 mg | ORAL_CAPSULE | Freq: Three times a day (TID) | ORAL | 11 refills | Status: DC

## 2019-03-09 NOTE — Telephone Encounter (Signed)
Rhonda from [facility] stating the pt is needing a refill on her [place] sent in. Please advise.

## 2019-03-13 MED ORDER — TECFIDERA 240 MG PO CPDR: DELAYED_RELEASE_CAPSULE | ORAL | 1 refills | Status: DC

## 2019-03-17 DIAGNOSIS — E785 Hyperlipidemia, unspecified: Secondary | ICD-10-CM | POA: Diagnosis not present

## 2019-03-17 DIAGNOSIS — D508 Other iron deficiency anemias: Secondary | ICD-10-CM | POA: Diagnosis not present

## 2019-03-17 DIAGNOSIS — F325 Major depressive disorder, single episode, in full remission: Secondary | ICD-10-CM | POA: Diagnosis not present

## 2019-03-17 DIAGNOSIS — F4321 Adjustment disorder with depressed mood: Secondary | ICD-10-CM | POA: Diagnosis not present

## 2019-03-17 DIAGNOSIS — I10 Essential (primary) hypertension: Secondary | ICD-10-CM | POA: Diagnosis not present

## 2019-03-17 DIAGNOSIS — M1712 Unilateral primary osteoarthritis, left knee: Secondary | ICD-10-CM | POA: Diagnosis not present

## 2019-03-22 ENCOUNTER — Telehealth: Payer: Self-pay | Admitting: *Deleted

## 2019-03-22 ENCOUNTER — Other Ambulatory Visit: Payer: Self-pay | Admitting: *Deleted

## 2019-03-22 MED ORDER — TECFIDERA 240 MG PO CPDR: DELAYED_RELEASE_CAPSULE | ORAL | 0 refills | Status: DC

## 2019-03-22 NOTE — Telephone Encounter (Signed)
Received fax from [employer] for new prescription of Tecfidera. The patient has been approved to receive assistance for Tecfidera through the [employer].

## 2019-03-27 ENCOUNTER — Telehealth: Payer: Self-pay | Admitting: Adult Health

## 2019-03-27 DIAGNOSIS — M1712 Unilateral primary osteoarthritis, left knee: Secondary | ICD-10-CM | POA: Diagnosis not present

## 2019-03-27 DIAGNOSIS — F4321 Adjustment disorder with depressed mood: Secondary | ICD-10-CM | POA: Diagnosis not present

## 2019-03-27 DIAGNOSIS — E785 Hyperlipidemia, unspecified: Secondary | ICD-10-CM | POA: Diagnosis not present

## 2019-03-27 DIAGNOSIS — D508 Other iron deficiency anemias: Secondary | ICD-10-CM | POA: Diagnosis not present

## 2019-03-27 DIAGNOSIS — I10 Essential (primary) hypertension: Secondary | ICD-10-CM | POA: Diagnosis not present

## 2019-03-27 DIAGNOSIS — F325 Major depressive disorder, single episode, in full remission: Secondary | ICD-10-CM | POA: Diagnosis not present

## 2019-03-27 NOTE — Telephone Encounter (Signed)
Pt is needing a refill on her traMADol (ULTRAM) 50 MG tablet sent in to [facility] on [address]

## 2019-03-27 NOTE — Telephone Encounter (Signed)
[place] Database Verified LR: 03-06-2019 Qty: 60 Pending appointment: 04-05-2019

## 2019-03-28 ENCOUNTER — Other Ambulatory Visit: Payer: Self-pay | Admitting: Adult Health

## 2019-04-03 ENCOUNTER — Other Ambulatory Visit: Payer: Self-pay | Admitting: Adult Health

## 2019-04-03 NOTE — Telephone Encounter (Signed)
[place] Database Verified LR: 03-06-2019 Qty: 60 Pending appointment: 04-05-2019

## 2019-04-05 ENCOUNTER — Other Ambulatory Visit: Payer: Self-pay

## 2019-04-05 ENCOUNTER — Ambulatory Visit: Payer: PPO | Admitting: Adult Health

## 2019-04-05 ENCOUNTER — Encounter: Payer: Self-pay | Admitting: Adult Health

## 2019-04-05 VITALS — BP 128/80 | HR 84 | Temp 97.0°F | Ht 61.5 in | Wt 184.4 lb

## 2019-04-05 DIAGNOSIS — M545 Low back pain, unspecified: Secondary | ICD-10-CM

## 2019-04-05 DIAGNOSIS — G35 Multiple sclerosis: Secondary | ICD-10-CM

## 2019-04-05 DIAGNOSIS — G8929 Other chronic pain: Secondary | ICD-10-CM | POA: Diagnosis not present

## 2019-04-05 NOTE — Progress Notes (Signed)
PATIENT: Andrea Cantrell DOB: [birth year]  REASON FOR VISIT: follow up HISTORY FROM: patient  HISTORY OF PRESENT ILLNESS: Today 04/05/19:  Andrea Cantrell is a 69 year old female with a history of multiple sclerosis.  She returns today for follow-up.  She remains on Tecfidera.  She denies any new numbness and tingling.  Reports that she has ongoing numbness in the left arm that comes intermittently.  Denies any changes with the bowels or bladder.  No significant changes with her gait or balance.  She did see an orthopedic in December who gave her injections in the knee.  She reports that this has been beneficial for her discomfort in the knees.  She continues to have back pain (severe spinal stenosis).  Denies any trouble with her vision.  She continues to take tramadol 50 to 100 mg a day.  She states that typically she takes 2 tablets every day.  She states that this has been beneficial for her ongoing discomfort.  In the past she has been at a pain clinic in [place].  She returns today for an evaluation.  MRI brain with and without contrast March 19, 2017:  IMPRESSION: This MRI of the brain with and without contrast shows the following: 1. Multiple T2/FLAIR hyperintense foci in the periventricular, juxtacortical, pericallosal and deep white matter of both hemispheres and in the pons in a pattern and configuration consistent with chronic demyelinating plaque associated with multiple sclerosis. None of the foci enhance after contrast administration or appears to be acute. 2. Compared to the MRI dated 08/09/2015, there has been no definite interval change. 3. There are no acute findings.   HISTORY 09/27/18:  Andrea Cantrell is a 69 year old female with a history of multiple sclerosis.  She returns today for follow-up.  She remains on Tecfidera and tolerates it well.  She reports that she continues to have knee and back pain.  She has not followed up with her orthopedic due to a  outstanding bill that she owes.  She states that her granddaughter was killed last year and she had to help support her son who was out of work due to grief.  She states that she is now back on track and hopefully can make a payment to follow-up with them.  She denies any new symptoms.  No changes with her gait or balance.  No new numbness or weakness.  She reports that her recent eye exam revealed cataracts.  She had a repeat MRI in January and lab work in April.  She returns today for an evaluation  REVIEW OF SYSTEMS: Out of a complete 14 system review of symptoms, the patient complains only of the following symptoms, and all other reviewed systems are negative.  See HPI  ALLERGIES: No Known Allergies  HOME MEDICATIONS: Outpatient Medications Prior to Visit  Medication Sig Dispense Refill  . albuterol (PROVENTIL HFA;VENTOLIN HFA) 108 (90 BASE) MCG/ACT inhaler Inhale 2 puffs into the lungs every 6 (six) hours as needed for wheezing or shortness of breath (wheezing and shortness of breath). For shortness of breath.    Marland Kitchen amLODipine (NORVASC) 5 MG tablet Take 5 mg by mouth every morning.     Marland Kitchen aspirin EC 81 MG tablet Take 81 mg by mouth every evening.     Marland Kitchen atorvastatin (LIPITOR) 10 MG tablet Take 10 mg by mouth every morning.     . baclofen (LIORESAL) 10 MG tablet TAKE 1 TABLET (10 MG TOTAL) BY MOUTH 3 (THREE) TIMES DAILY. 90 tablet  0  . Calcium Carbonate (CALCIUM 600 PO) Take 600 mg by mouth in the morning and at bedtime.    . cholecalciferol (VITAMIN D) 25 MCG (1000 UNIT) tablet Take 1,000 Units by mouth daily.    . diphenhydrAMINE-APAP, sleep, (TYLENOL PM EXTRA STRENGTH PO) Take 1 tablet by mouth at bedtime.    . gabapentin (NEURONTIN) 300 MG capsule Take 1 capsule (300 mg total) by mouth 3 (three) times daily. 90 capsule 11  . ibuprofen (ADVIL,MOTRIN) 200 MG tablet Take 600 mg by mouth every 6 (six) hours as needed for moderate pain.    Marland Kitchen levalbuterol (XOPENEX HFA) 45 MCG/ACT inhaler Inhale  2 puffs into the lungs every 6 (six) hours.    . meloxicam (MOBIC) 7.5 MG tablet Take 1 tablet (7.5 mg total) by mouth as needed for pain. 30 tablet 6  . polyethylene glycol (MIRALAX / GLYCOLAX) packet Uses as needed for constipation at home.  He can buy this over-the-counter at any drugstore.  Follow package instructions. 14 each 0  . TECFIDERA 240 MG CPDR TAKE ONE CAPSULE BY MOUTH TWICE DAILY. STORE IN ORIGINAL CONTAINER AT ROOM TEMPERATURE. 180 capsule 0  . traMADol (ULTRAM) 50 MG tablet TAKE ONE TABLET BY MOUTH TWICE DAILY AS NEEDED 60 tablet 0  . acetaminophen (TYLENOL) 500 MG tablet Take 500 mg by mouth at bedtime.    . Iron-Vitamins (GERITOL COMPLETE) TABS Take by mouth.    Marland Kitchen VITAMIN D, CHOLECALCIFEROL, PO Take 10,000 Units by mouth daily.      No facility-administered medications prior to visit.    PAST MEDICAL HISTORY: Past Medical History:  Diagnosis Date  . Discoid lupus   . Essential hypertension   . Fibromyalgia   . Heavy smoker (more than 20 cigarettes per day)    Was able to stop for 11 years but restarted about 10 years ago  . Hyperlipidemia with target LDL less than 130   . MS (multiple sclerosis) ([place])   . Obesity (BMI 35.0-39.9 without comorbidity)   . Spinal stenosis     PAST SURGICAL HISTORY: Past Surgical History:  Procedure Laterality Date  . ABDOMINAL HYSTERECTOMY  1995  . BREAST SURGERY Left    tissue removal  . [place]  . CHOLECYSTECTOMY  1994  . INCISIONAL HERNIA REPAIR N/A 12/09/2016   Procedure: LAPAROSCOPIC INCISIONAL HERNIA REPAIR WITH MESH;  Surgeon: Excell Seltzer, MD;  Location: [facility];  Service: General;  Laterality: N/A;  . NM MYOVIEW LTD  October 2015   EF 71%. No ischemia or infarction. Low risk/normal  . ROTATOR CUFF REPAIR Left June 2016  . TRANSTHORACIC ECHOCARDIOGRAM  07/2016    (To evaluate murmur).  Normal LV size and function.  EF 60-65%.  No RWMA.  Normal diastolic function. No comment on valvular disease.  -- NORMAL ECHO    FAMILY HISTORY: Family History  Problem Relation Age of Onset  . Cancer Mother   . Cancer Father   . Diabetes Brother   . Diabetes Maternal Grandmother     SOCIAL HISTORY: Social History   Socioeconomic History  . Marital status: Legally Separated    Spouse name: Not on file  . Number of children: 2  . Years of education: 12+  . Highest education level: Not on file  Occupational History  . Not on file  Tobacco Use  . Smoking status: Current Every Day Smoker    Packs/day: 1.00    Types: Cigarettes  . Smokeless tobacco: Never Used  Substance and Sexual Activity  . Alcohol use: No  . Drug use: No  . Sexual activity: Not on file  Other Topics Concern  . Not on file  Social History Narrative   Patient lives at home with granddaughter her her 2 children.    Patient is separated. Patient has 2 children.    Patient has some college. Patient is on Disability.    Smokes roughly one pack a day. No alcohol.   Social Determinants of Health   Financial Resource Strain:   . Difficulty of Paying Living Expenses: Not on file  Food Insecurity:   . Worried About Charity fundraiser in the Last Year: Not on file  . Ran Out of Food in the Last Year: Not on file  Transportation Needs:   . Lack of Transportation (Medical): Not on file  . Lack of Transportation (Non-Medical): Not on file  Physical Activity:   . Days of Exercise per Week: Not on file  . Minutes of Exercise per Session: Not on file  Stress:   . Feeling of Stress : Not on file  Social Connections:   . Frequency of Communication with Friends and Family: Not on file  . Frequency of Social Gatherings with Friends and Family: Not on file  . Attends Religious Services: Not on file  . Active Member of Clubs or Organizations: Not on file  . Attends Archivist Meetings: Not on file  . Marital Status: Not on file  Intimate Partner Violence:   . Fear of Current or Ex-Partner: Not on file  .  Emotionally Abused: Not on file  . Physically Abused: Not on file  . Sexually Abused: Not on file      PHYSICAL EXAM  Vitals:   04/05/19 1046  BP: 128/80  Pulse: 84  Temp: (!) 97 F (36.1 C)  TempSrc: Oral  Weight: 184 lb 6.4 oz (83.6 kg)  Height: 5' 1.5" (1.562 m)   Body mass index is 34.28 kg/m.  Generalized: Well developed, in no acute distress   Neurological examination  Mentation: Alert oriented to time, place, history taking. Follows all commands speech and language fluent Cranial nerve II-XII: Pupils were equal round reactive to light. Extraocular movements were full, visual field were full on confrontational test. Facial sensation and strength were normal. Uvula tongue midline. Head turning and shoulder shrug  were normal and symmetric. Motor: The motor testing reveals 5 over 5 strength of all 4 extremities. Good symmetric motor tone is noted throughout.  Sensory: Sensory testing is intact to soft touch on all 4 extremities. No evidence of extinction is noted.  Coordination: Cerebellar testing reveals good finger-nose-finger and heel-to-shin bilaterally.  Gait and station: Gait is normal. Tandem gait is normal. Romberg is negative. No drift is seen.  Reflexes: Deep tendon reflexes are symmetric and normal bilaterally.   DIAGNOSTIC DATA (LABS, IMAGING, TESTING) - I reviewed patient records, labs, notes, testing and imaging myself where available.  Lab Results  Component Value Date   WBC 4.6 06/30/2018   HGB 14.7 06/30/2018   HCT 41.0 06/30/2018   MCV 85 06/30/2018   PLT 369 06/30/2018      Component Value Date/Time   NA 143 06/30/2018 1038   K 4.4 06/30/2018 1038   CL 103 06/30/2018 1038   CO2 23 06/30/2018 1038   GLUCOSE 84 06/30/2018 1038   GLUCOSE 91 12/11/2016 0507   BUN 17 06/30/2018 1038   CREATININE 0.63 06/30/2018 1038   CALCIUM 9.6  06/30/2018 1038   PROT 7.2 06/30/2018 1038   ALBUMIN 4.5 06/30/2018 1038   AST 20 06/30/2018 1038   ALT 21  06/30/2018 1038   ALKPHOS 112 06/30/2018 1038   BILITOT 0.6 06/30/2018 1038   GFRNONAA 92 06/30/2018 1038   GFRAA 107 06/30/2018 1038    Lab Results  Component Value Date   TSH 1.200 06/30/2018      ASSESSMENT AND PLAN 69 y.o. year old female  has a past medical history of Discoid lupus, Essential hypertension, Fibromyalgia, Heavy smoker (more than 20 cigarettes per day), Hyperlipidemia with target LDL less than 130, MS (multiple sclerosis) ([place]), Obesity (BMI 35.0-39.9 without comorbidity), and Spinal stenosis. here with:  1.  Multiple sclerosis 2. Back pain   -Continue Tecfidera -Continue tramadol 50 mg 1 to 2 tablets daily as needed. -Pain contract reviewed and signed today -Blood work-CBC, CMP -Urine drug screen -We will consider repeating MRI of the brain at the next visit -Follow-up in 6 months or sooner if needed   I spent 15 minutes with the patient. 50% of this time was spent   Ward Givens, MSN, NP-C 04/05/2019, 10:54 AM [facility] [address], [place], [place] [address] [phone]

## 2019-04-05 NOTE — Progress Notes (Signed)
I have reviewed and agreed above plan. 

## 2019-04-05 NOTE — Patient Instructions (Signed)
Your Plan:  Continue Tecfidera Blood work today  Tramadol refilled Pain contract started If your symptoms worsen or you develop new symptoms please let us know.    Thank you for coming to see [place] at [facility]. I hope we have been able to provide you high quality care today.  You may receive a patient satisfaction survey over the next few weeks. We would appreciate your feedback and comments so that we may continue to improve ourselves and the health of our patients.

## 2019-04-06 LAB — CBC WITH DIFFERENTIAL/PLATELET
Basophils Absolute: 0 10*3/uL (ref 0.0–0.2)
Basos: 1 %
EOS (ABSOLUTE): 0.1 10*3/uL (ref 0.0–0.4)
Eos: 1 %
Hematocrit: 42.4 % (ref 34.0–46.6)
Hemoglobin: 14.5 g/dL (ref 11.1–15.9)
Immature Grans (Abs): 0 10*3/uL (ref 0.0–0.1)
Immature Granulocytes: 0 %
Lymphocytes Absolute: 1.1 10*3/uL (ref 0.7–3.1)
Lymphs: 19 %
MCH: 29.9 pg (ref 26.6–33.0)
MCHC: 34.2 g/dL (ref 31.5–35.7)
MCV: 87 fL (ref 79–97)
Monocytes Absolute: 0.6 10*3/uL (ref 0.1–0.9)
Monocytes: 11 %
Neutrophils Absolute: 3.8 10*3/uL (ref 1.4–7.0)
Neutrophils: 68 %
Platelets: 388 10*3/uL (ref 150–450)
RBC: 4.85 x10E6/uL (ref 3.77–5.28)
RDW: 14.3 % (ref 11.7–15.4)
WBC: 5.6 10*3/uL (ref 3.4–10.8)

## 2019-04-06 LAB — DRUG SCREEN, URINE
Amphetamines, Urine: NEGATIVE ng/mL
Barbiturate screen, urine: NEGATIVE ng/mL
Benzodiazepine Quant, Ur: NEGATIVE ng/mL
Cannabinoid Quant, Ur: NEGATIVE ng/mL
Cocaine (Metab.): NEGATIVE ng/mL
Opiate Quant, Ur: NEGATIVE ng/mL
PCP Quant, Ur: NEGATIVE ng/mL

## 2019-04-06 LAB — COMPREHENSIVE METABOLIC PANEL
ALT: 11 IU/L (ref 0–32)
AST: 16 IU/L (ref 0–40)
Albumin/Globulin Ratio: 1.7 (ref 1.2–2.2)
Albumin: 4.3 g/dL (ref 3.8–4.8)
Alkaline Phosphatase: 124 IU/L — ABNORMAL HIGH (ref 39–117)
BUN/Creatinine Ratio: 20 (ref 12–28)
BUN: 17 mg/dL (ref 8–27)
Bilirubin Total: 0.3 mg/dL (ref 0.0–1.2)
CO2: 24 mmol/L (ref 20–29)
Calcium: 9.2 mg/dL (ref 8.7–10.3)
Chloride: 103 mmol/L (ref 96–106)
Creatinine, Ser: 0.86 mg/dL (ref 0.57–1.00)
GFR calc Af Amer: 80 mL/min/{1.73_m2} (ref >59)
GFR calc non Af Amer: 69 mL/min/{1.73_m2} (ref >59)
Globulin, Total: 2.5 g/dL (ref 1.5–4.5)
Glucose: 89 mg/dL (ref 65–99)
Potassium: 4.5 mmol/L (ref 3.5–5.2)
Sodium: 142 mmol/L (ref 134–144)
Total Protein: 6.8 g/dL (ref 6.0–8.5)

## 2019-05-04 DIAGNOSIS — M1711 Unilateral primary osteoarthritis, right knee: Secondary | ICD-10-CM | POA: Diagnosis not present

## 2019-05-04 DIAGNOSIS — M25561 Pain in right knee: Secondary | ICD-10-CM | POA: Diagnosis not present

## 2019-05-04 DIAGNOSIS — M1712 Unilateral primary osteoarthritis, left knee: Secondary | ICD-10-CM | POA: Diagnosis not present

## 2019-05-04 DIAGNOSIS — M25562 Pain in left knee: Secondary | ICD-10-CM | POA: Diagnosis not present

## 2019-05-08 ENCOUNTER — Other Ambulatory Visit: Payer: Self-pay | Admitting: Neurology

## 2019-05-08 ENCOUNTER — Other Ambulatory Visit: Payer: Self-pay | Admitting: Adult Health

## 2019-05-08 DIAGNOSIS — M1712 Unilateral primary osteoarthritis, left knee: Secondary | ICD-10-CM | POA: Diagnosis not present

## 2019-05-08 DIAGNOSIS — F325 Major depressive disorder, single episode, in full remission: Secondary | ICD-10-CM | POA: Diagnosis not present

## 2019-05-08 DIAGNOSIS — F4321 Adjustment disorder with depressed mood: Secondary | ICD-10-CM | POA: Diagnosis not present

## 2019-05-08 DIAGNOSIS — D508 Other iron deficiency anemias: Secondary | ICD-10-CM | POA: Diagnosis not present

## 2019-05-08 DIAGNOSIS — E785 Hyperlipidemia, unspecified: Secondary | ICD-10-CM | POA: Diagnosis not present

## 2019-05-08 DIAGNOSIS — I10 Essential (primary) hypertension: Secondary | ICD-10-CM | POA: Diagnosis not present

## 2019-05-08 NOTE — Telephone Encounter (Signed)
Pt is up to date on her appts. Pt is due for a refill. [place] Controlled Substance Database verified.  UDS on file.

## 2019-05-25 ENCOUNTER — Telehealth: Payer: Self-pay | Admitting: Adult Health

## 2019-05-25 MED ORDER — TECFIDERA 240 MG PO CPDR: DELAYED_RELEASE_CAPSULE | ORAL | 3 refills | Status: DC

## 2019-05-25 NOTE — Telephone Encounter (Signed)
Order placed [facility] for tecfidera.

## 2019-05-25 NOTE — Telephone Encounter (Signed)
Pt call stating she is needing a refill on her [place] and have it sent to [facility]. She is needing a new prescription faxed to [phone] Please advise.

## 2019-05-31 ENCOUNTER — Telehealth: Payer: Self-pay | Admitting: Adult Health

## 2019-05-31 NOTE — Telephone Encounter (Signed)
I called pt and relayed that we are recommending pts to get covid vacc,she is on tecfidera. (also relayed that she could read the cdc guidelines as well).  She verbalized understanding.

## 2019-05-31 NOTE — Telephone Encounter (Signed)
Pt would like a call to discuss if she should get the Covid-19 vaccine, please call

## 2019-06-05 ENCOUNTER — Other Ambulatory Visit: Payer: Self-pay | Admitting: Adult Health

## 2019-06-05 ENCOUNTER — Telehealth: Payer: Self-pay | Admitting: Adult Health

## 2019-06-05 NOTE — Telephone Encounter (Signed)
I have pended the RX for tramadol for Andrea Blossom, NP to review and sign.

## 2019-06-05 NOTE — Telephone Encounter (Signed)
[place] Controlled Substance Registry reviewed and is appropriate. Pt is up to date on her appts and is due for a refill on tramadol.

## 2019-06-05 NOTE — Telephone Encounter (Signed)
Pt called stating she is needing a new prescription sent in for her traMADol (ULTRAM) 50 MG tablet to [facility]

## 2019-06-06 NOTE — Telephone Encounter (Signed)
I called pt and advised her that Jinny Blossom, NP has refilled her tramadol RX. Pt verbalized understanding and appreciation.

## 2019-06-16 DIAGNOSIS — M1712 Unilateral primary osteoarthritis, left knee: Secondary | ICD-10-CM | POA: Diagnosis not present

## 2019-06-16 DIAGNOSIS — F4321 Adjustment disorder with depressed mood: Secondary | ICD-10-CM | POA: Diagnosis not present

## 2019-06-16 DIAGNOSIS — F325 Major depressive disorder, single episode, in full remission: Secondary | ICD-10-CM | POA: Diagnosis not present

## 2019-06-16 DIAGNOSIS — D508 Other iron deficiency anemias: Secondary | ICD-10-CM | POA: Diagnosis not present

## 2019-06-16 DIAGNOSIS — I10 Essential (primary) hypertension: Secondary | ICD-10-CM | POA: Diagnosis not present

## 2019-06-16 DIAGNOSIS — E785 Hyperlipidemia, unspecified: Secondary | ICD-10-CM | POA: Diagnosis not present

## 2019-07-05 ENCOUNTER — Other Ambulatory Visit: Payer: Self-pay | Admitting: Adult Health

## 2019-07-05 DIAGNOSIS — D508 Other iron deficiency anemias: Secondary | ICD-10-CM | POA: Diagnosis not present

## 2019-07-05 DIAGNOSIS — F325 Major depressive disorder, single episode, in full remission: Secondary | ICD-10-CM | POA: Diagnosis not present

## 2019-07-05 DIAGNOSIS — E785 Hyperlipidemia, unspecified: Secondary | ICD-10-CM | POA: Diagnosis not present

## 2019-07-05 DIAGNOSIS — M1712 Unilateral primary osteoarthritis, left knee: Secondary | ICD-10-CM | POA: Diagnosis not present

## 2019-07-05 DIAGNOSIS — I10 Essential (primary) hypertension: Secondary | ICD-10-CM | POA: Diagnosis not present

## 2019-07-05 DIAGNOSIS — F4321 Adjustment disorder with depressed mood: Secondary | ICD-10-CM | POA: Diagnosis not present

## 2019-07-26 DIAGNOSIS — E785 Hyperlipidemia, unspecified: Secondary | ICD-10-CM | POA: Diagnosis not present

## 2019-07-26 DIAGNOSIS — I10 Essential (primary) hypertension: Secondary | ICD-10-CM | POA: Diagnosis not present

## 2019-07-26 DIAGNOSIS — Z202 Contact with and (suspected) exposure to infections with a predominantly sexual mode of transmission: Secondary | ICD-10-CM | POA: Diagnosis not present

## 2019-07-26 DIAGNOSIS — G35 Multiple sclerosis: Secondary | ICD-10-CM | POA: Diagnosis not present

## 2019-07-26 DIAGNOSIS — E559 Vitamin D deficiency, unspecified: Secondary | ICD-10-CM | POA: Diagnosis not present

## 2019-07-26 DIAGNOSIS — B009 Herpesviral infection, unspecified: Secondary | ICD-10-CM | POA: Diagnosis not present

## 2019-07-31 DIAGNOSIS — M25561 Pain in right knee: Secondary | ICD-10-CM | POA: Diagnosis not present

## 2019-07-31 DIAGNOSIS — M25562 Pain in left knee: Secondary | ICD-10-CM | POA: Diagnosis not present

## 2019-07-31 DIAGNOSIS — M1711 Unilateral primary osteoarthritis, right knee: Secondary | ICD-10-CM | POA: Diagnosis not present

## 2019-07-31 DIAGNOSIS — M1712 Unilateral primary osteoarthritis, left knee: Secondary | ICD-10-CM | POA: Diagnosis not present

## 2019-08-03 ENCOUNTER — Other Ambulatory Visit: Payer: Self-pay | Admitting: Adult Health

## 2019-08-03 NOTE — Addendum Note (Signed)
Addended by: Brandon Melnick on: 08/03/2019 02:03 PM   Modules accepted: Orders

## 2019-08-03 NOTE — Telephone Encounter (Signed)
Pt has called asking if her traMADol (ULTRAM) 50 MG tablet can be called into [facility].  Pt states she is going out of town on Saturday and doesn't want to go without this medication.

## 2019-08-04 ENCOUNTER — Telehealth: Payer: Self-pay | Admitting: Neurology

## 2019-08-04 ENCOUNTER — Other Ambulatory Visit: Payer: Self-pay | Admitting: Neurology

## 2019-08-04 ENCOUNTER — Telehealth: Payer: Self-pay | Admitting: Adult Health

## 2019-08-04 ENCOUNTER — Other Ambulatory Visit: Payer: Self-pay | Admitting: Adult Health

## 2019-08-04 DIAGNOSIS — F325 Major depressive disorder, single episode, in full remission: Secondary | ICD-10-CM | POA: Diagnosis not present

## 2019-08-04 DIAGNOSIS — I10 Essential (primary) hypertension: Secondary | ICD-10-CM | POA: Diagnosis not present

## 2019-08-04 DIAGNOSIS — E785 Hyperlipidemia, unspecified: Secondary | ICD-10-CM | POA: Diagnosis not present

## 2019-08-04 DIAGNOSIS — D508 Other iron deficiency anemias: Secondary | ICD-10-CM | POA: Diagnosis not present

## 2019-08-04 DIAGNOSIS — F4321 Adjustment disorder with depressed mood: Secondary | ICD-10-CM | POA: Diagnosis not present

## 2019-08-04 DIAGNOSIS — M1712 Unilateral primary osteoarthritis, left knee: Secondary | ICD-10-CM | POA: Diagnosis not present

## 2019-08-04 MED ORDER — TRAMADOL HCL 50 MG PO TABS: 50.0000 mg | ORAL_TABLET | Freq: Two times a day (BID) | ORAL | 0 refills | Status: DC | PRN

## 2019-08-04 NOTE — Telephone Encounter (Signed)
atient paged the on call today for refill of tramadol to [facility], I refilled it fyi

## 2019-08-04 NOTE — Telephone Encounter (Signed)
Pt is needing a refill on her traMADol (ULTRAM) 50 MG tablet sent in to [facility]

## 2019-08-07 ENCOUNTER — Telehealth: Payer: Self-pay | Admitting: *Deleted

## 2019-08-07 MED ORDER — TRAMADOL HCL 50 MG PO TABS: 50.0000 mg | ORAL_TABLET | Freq: Two times a day (BID) | ORAL | 0 refills | Status: DC | PRN

## 2019-08-07 NOTE — Telephone Encounter (Signed)
This was done.

## 2019-08-07 NOTE — Telephone Encounter (Signed)
I cancelled the tramadol prescription done today by MM//NP as already filled by Dr. Jaynee Eagles on the weekend.  Spoke to [place], at [employer].  She will cancel.

## 2019-08-07 NOTE — Telephone Encounter (Signed)
I cancelled this per MM/NP (spoke to with Alli) at [facility].

## 2019-09-05 ENCOUNTER — Other Ambulatory Visit: Payer: Self-pay | Admitting: Neurology

## 2019-09-06 NOTE — Telephone Encounter (Signed)
Mickel Baas @ [facility] has called asking that Pt traMADol (ULTRAM) 50 MG tablet be called into to [facility]  .  She is aware the request is in the system as of yesterday.  She called to make sure it is processed today for pt

## 2019-09-07 ENCOUNTER — Telehealth: Payer: Self-pay | Admitting: Adult Health

## 2019-09-07 NOTE — Telephone Encounter (Signed)
It looks like the prescription for Ultram was already sent in today.

## 2019-09-07 NOTE — Telephone Encounter (Signed)
Pt called needing a refill on her traMADol (ULTRAM) 50 MG tablet sent to the [facility]  Pt states she is out.

## 2019-09-14 DIAGNOSIS — F4321 Adjustment disorder with depressed mood: Secondary | ICD-10-CM | POA: Diagnosis not present

## 2019-09-14 DIAGNOSIS — F325 Major depressive disorder, single episode, in full remission: Secondary | ICD-10-CM | POA: Diagnosis not present

## 2019-09-14 DIAGNOSIS — D508 Other iron deficiency anemias: Secondary | ICD-10-CM | POA: Diagnosis not present

## 2019-09-14 DIAGNOSIS — I10 Essential (primary) hypertension: Secondary | ICD-10-CM | POA: Diagnosis not present

## 2019-09-14 DIAGNOSIS — E785 Hyperlipidemia, unspecified: Secondary | ICD-10-CM | POA: Diagnosis not present

## 2019-09-14 DIAGNOSIS — M1712 Unilateral primary osteoarthritis, left knee: Secondary | ICD-10-CM | POA: Diagnosis not present

## 2019-09-27 DIAGNOSIS — J069 Acute upper respiratory infection, unspecified: Secondary | ICD-10-CM | POA: Diagnosis not present

## 2019-09-28 DIAGNOSIS — R05 Cough: Secondary | ICD-10-CM | POA: Diagnosis not present

## 2019-09-28 DIAGNOSIS — J069 Acute upper respiratory infection, unspecified: Secondary | ICD-10-CM | POA: Diagnosis not present

## 2019-09-28 DIAGNOSIS — Z03818 Encounter for observation for suspected exposure to other biological agents ruled out: Secondary | ICD-10-CM | POA: Diagnosis not present

## 2019-10-05 ENCOUNTER — Other Ambulatory Visit: Payer: Self-pay | Admitting: Neurology

## 2019-10-05 ENCOUNTER — Telehealth: Payer: Self-pay | Admitting: Adult Health

## 2019-10-05 NOTE — Telephone Encounter (Signed)
Pt is needing a refill for her traMADol (ULTRAM) 50 MG tablet sent in to [facility]

## 2019-10-05 NOTE — Telephone Encounter (Signed)
Already done

## 2019-10-11 ENCOUNTER — Encounter: Payer: Self-pay | Admitting: Adult Health

## 2019-10-11 ENCOUNTER — Telehealth: Payer: Self-pay | Admitting: Adult Health

## 2019-10-11 ENCOUNTER — Ambulatory Visit: Payer: PPO | Admitting: Adult Health

## 2019-10-11 VITALS — BP 108/65 | HR 72 | Ht 60.0 in | Wt 180.0 lb

## 2019-10-11 DIAGNOSIS — G8929 Other chronic pain: Secondary | ICD-10-CM | POA: Diagnosis not present

## 2019-10-11 DIAGNOSIS — M545 Low back pain, unspecified: Secondary | ICD-10-CM

## 2019-10-11 DIAGNOSIS — G35 Multiple sclerosis: Secondary | ICD-10-CM | POA: Diagnosis not present

## 2019-10-11 MED ORDER — GABAPENTIN 400 MG PO CAPS: 400.0000 mg | ORAL_CAPSULE | Freq: Three times a day (TID) | ORAL | 11 refills | Status: DC

## 2019-10-11 NOTE — Telephone Encounter (Signed)
Health team order sent to [facility]. No auth they will reach out to the patient to schedule.  

## 2019-10-11 NOTE — Progress Notes (Signed)
PATIENT: Andrea Cantrell DOB: Mar 17, 1950  REASON FOR VISIT: follow up HISTORY FROM: patient  HISTORY OF PRESENT ILLNESS: Today 10/11/19:  Andrea Cantrell is a 69 year old female with a history of multiple sclerosis.  She returns today for follow-up.  She remains on Tecfidera.  She states that she has noticed more numbness and tingling in the left buttocks.  She states that if she touches this area she has discomfort.  In the past she has been diagnosed with spinal stenosis but has not followed up with that physician.  She is unsure of his name.  She denies any significant changes with her gait or balance.  No changes with the bowels or bladder.  Her last MRI of the brain was in 2019.  She remains on tramadol for pain.  HISTORY 04/05/19:  Andrea Cantrell is a 69 year old female with a history of multiple sclerosis.  She returns today for follow-up.  She remains on Tecfidera.  She denies any new numbness and tingling.  Reports that she has ongoing numbness in the left arm that comes intermittently.  Denies any changes with the bowels or bladder.  No significant changes with her gait or balance.  She did see an orthopedic in December who gave her injections in the knee.  She reports that this has been beneficial for her discomfort in the knees.  She continues to have back pain (severe spinal stenosis).  Denies any trouble with her vision.  She continues to take tramadol 50 to 100 mg a day.  She states that typically she takes 2 tablets every day.  She states that this has been beneficial for her ongoing discomfort.  In the past she has been at a pain clinic in [place].  She returns today for an evaluation.  REVIEW OF SYSTEMS: Out of a complete 14 system review of symptoms, the patient complains only of the following symptoms, and all other reviewed systems are negative.  See HPI  ALLERGIES: No Known Allergies  HOME MEDICATIONS: Outpatient Medications Prior to Visit  Medication Sig Dispense  Refill  . albuterol (PROVENTIL HFA;VENTOLIN HFA) 108 (90 BASE) MCG/ACT inhaler Inhale 2 puffs into the lungs every 6 (six) hours as needed for wheezing or shortness of breath (wheezing and shortness of breath). For shortness of breath.    Marland Kitchen amLODipine (NORVASC) 5 MG tablet Take 5 mg by mouth every morning.     Marland Kitchen aspirin EC 81 MG tablet Take 81 mg by mouth every evening.     Marland Kitchen atorvastatin (LIPITOR) 10 MG tablet Take 10 mg by mouth every morning.     . baclofen (LIORESAL) 10 MG tablet TAKE 1 TABLET (10 MG TOTAL) BY MOUTH 3 (THREE) TIMES DAILY. 90 tablet 11  . Calcium Carbonate (CALCIUM 600 PO) Take 600 mg by mouth in the morning and at bedtime.    . cholecalciferol (VITAMIN D) 25 MCG (1000 UNIT) tablet Take 1,000 Units by mouth daily.    . diphenhydrAMINE-APAP, sleep, (TYLENOL PM EXTRA STRENGTH PO) Take 1 tablet by mouth at bedtime.    Marland Kitchen ibuprofen (ADVIL,MOTRIN) 200 MG tablet Take 600 mg by mouth every 6 (six) hours as needed for moderate pain.    Marland Kitchen levalbuterol (XOPENEX HFA) 45 MCG/ACT inhaler Inhale 2 puffs into the lungs every 6 (six) hours.    . meloxicam (MOBIC) 7.5 MG tablet Take 1 tablet (7.5 mg total) by mouth as needed for pain. 30 tablet 6  . polyethylene glycol (MIRALAX / GLYCOLAX) packet Uses as needed  for constipation at home.  He can buy this over-the-counter at any drugstore.  Follow package instructions. 14 each 0  . TECFIDERA 240 MG CPDR TAKE ONE CAPSULE BY MOUTH TWICE DAILY. STORE IN ORIGINAL CONTAINER AT ROOM TEMPERATURE. 180 capsule 3  . traMADol (ULTRAM) 50 MG tablet TAKE ONE TABLET BY MOUTH TWICE DAILY AS NEEDED 60 tablet 0  . gabapentin (NEURONTIN) 300 MG capsule Take 1 capsule (300 mg total) by mouth 3 (three) times daily. 90 capsule 11   No facility-administered medications prior to visit.    PAST MEDICAL HISTORY: Past Medical History:  Diagnosis Date  . Discoid lupus   . Essential hypertension   . Fibromyalgia   . Heavy smoker (more than 20 cigarettes per day)     Was able to stop for 11 years but restarted about 10 years ago  . Hyperlipidemia with target LDL less than 130   . MS (multiple sclerosis) ([place])   . Obesity (BMI 35.0-39.9 without comorbidity)   . Spinal stenosis     PAST SURGICAL HISTORY: Past Surgical History:  Procedure Laterality Date  . ABDOMINAL HYSTERECTOMY  1995  . BREAST SURGERY Left    tissue removal  . [place]  . CHOLECYSTECTOMY  1994  . INCISIONAL HERNIA REPAIR N/A 12/09/2016   Procedure: LAPAROSCOPIC INCISIONAL HERNIA REPAIR WITH MESH;  Surgeon: Excell Seltzer, MD;  Location: [facility];  Service: General;  Laterality: N/A;  . NM MYOVIEW LTD  October 2015   EF 71%. No ischemia or infarction. Low risk/normal  . ROTATOR CUFF REPAIR Left June 2016  . TRANSTHORACIC ECHOCARDIOGRAM  07/2016    (To evaluate murmur).  Normal LV size and function.  EF 60-65%.  No RWMA.  Normal diastolic function. No comment on valvular disease. -- NORMAL ECHO    FAMILY HISTORY: Family History  Problem Relation Age of Onset  . Cancer Mother   . Cancer Father   . Diabetes Brother   . Diabetes Maternal Grandmother     SOCIAL HISTORY: Social History   Socioeconomic History  . Marital status: Legally Separated    Spouse name: Not on file  . Number of children: 2  . Years of education: 12+  . Highest education level: Not on file  Occupational History  . Not on file  Tobacco Use  . Smoking status: Current Every Day Smoker    Packs/day: 1.00    Types: Cigarettes  . Smokeless tobacco: Never Used  Substance and Sexual Activity  . Alcohol use: No  . Drug use: No  . Sexual activity: Not on file  Other Topics Concern  . Not on file  Social History Narrative   Patient lives at home with granddaughter her her 2 children.    Patient is separated. Patient has 2 children.    Patient has some college. Patient is on Disability.    Smokes roughly one pack a day. No alcohol.   Social Determinants of Health    Financial Resource Strain:   . Difficulty of Paying Living Expenses: Not on file  Food Insecurity:   . Worried About Charity fundraiser in the Last Year: Not on file  . Ran Out of Food in the Last Year: Not on file  Transportation Needs:   . Lack of Transportation (Medical): Not on file  . Lack of Transportation (Non-Medical): Not on file  Physical Activity:   . Days of Exercise per Week: Not on file  . Minutes of Exercise per  Session: Not on file  Stress:   . Feeling of Stress : Not on file  Social Connections:   . Frequency of Communication with Friends and Family: Not on file  . Frequency of Social Gatherings with Friends and Family: Not on file  . Attends Religious Services: Not on file  . Active Member of Clubs or Organizations: Not on file  . Attends Archivist Meetings: Not on file  . Marital Status: Not on file  Intimate Partner Violence:   . Fear of Current or Ex-Partner: Not on file  . Emotionally Abused: Not on file  . Physically Abused: Not on file  . Sexually Abused: Not on file      PHYSICAL EXAM  Vitals:   10/11/19 1108  BP: 108/65  Pulse: 72  Weight: 180 lb (81.6 kg)  Height: 5' (1.524 m)   Body mass index is 35.15 kg/m.  Generalized: Well developed, in no acute distress   Neurological examination  Mentation: Alert oriented to time, place, history taking. Follows all commands speech and language fluent Cranial nerve II-XII: Pupils were equal round reactive to light. Extraocular movements were full, visual field were full on confrontational test.  Head turning and shoulder shrug  were normal and symmetric. Motor: The motor testing reveals 5 over 5 strength of all 4 extremities. Good symmetric motor tone is noted throughout.  Sensory: Sensory testing is intact to soft touch on all 4 extremities. No evidence of extinction is noted.  Coordination: Cerebellar testing reveals good finger-nose-finger and heel-to-shin bilaterally.  Gait and  station: Gait is normal. Tandem gait is normal. Romberg is negative. No drift is seen.  Reflexes: Deep tendon reflexes are symmetric and normal bilaterally.   DIAGNOSTIC DATA (LABS, IMAGING, TESTING) - I reviewed patient records, labs, notes, testing and imaging myself where available.  Lab Results  Component Value Date   WBC 5.6 04/05/2019   HGB 14.5 04/05/2019   HCT 42.4 04/05/2019   MCV 87 04/05/2019   PLT 388 04/05/2019      Component Value Date/Time   NA 142 04/05/2019 1137   K 4.5 04/05/2019 1137   CL 103 04/05/2019 1137   CO2 24 04/05/2019 1137   GLUCOSE 89 04/05/2019 1137   GLUCOSE 91 12/11/2016 0507   BUN 17 04/05/2019 1137   CREATININE 0.86 04/05/2019 1137   CALCIUM 9.2 04/05/2019 1137   PROT 6.8 04/05/2019 1137   ALBUMIN 4.3 04/05/2019 1137   AST 16 04/05/2019 1137   ALT 11 04/05/2019 1137   ALKPHOS 124 (H) 04/05/2019 1137   BILITOT 0.3 04/05/2019 1137   GFRNONAA 69 04/05/2019 1137   GFRAA 80 04/05/2019 1137   No results found for: CHOL, HDL, LDLCALC, LDLDIRECT, TRIG, CHOLHDL No results found for: HGBA1C No results found for: VITAMINB12 Lab Results  Component Value Date   TSH 1.200 06/30/2018      ASSESSMENT AND PLAN 69 y.o. year old female  has a past medical history of Discoid lupus, Essential hypertension, Fibromyalgia, Heavy smoker (more than 20 cigarettes per day), Hyperlipidemia with target LDL less than 130, MS (multiple sclerosis) ([place]), Obesity (BMI 35.0-39.9 without comorbidity), and Spinal stenosis. here with:  1.  Multiple sclerosis 2.  Low back pain   Continue Tecfidera  Blood work today  MRI of the brain ordered  Increase gabapentin to 400 mg 3 times a day  Continue tramadol as needed for pain  Follow-up in 6 months or sooner if needed   I spent 25 minutes of  face-to-face and non-face-to-face time with patient.  This included previsit chart review, lab review, study review, order entry, electronic health record documentation,  patient education.  Ward Givens, MSN, NP-C 10/11/2019, 11:54 AM [facility] [address] [place], [place] [address] [phone]

## 2019-10-11 NOTE — Patient Instructions (Signed)
Your Plan:  Continue Tecfidera Increase gabapentin 400 mg three times a day MRI brain ordered If your symptoms worsen or you develop new symptoms please let us know.       Thank you for coming to see [place] at [facility]. I hope we have been able to provide you high quality care today.  You may receive a patient satisfaction survey over the next few weeks. We would appreciate your feedback and comments so that we may continue to improve ourselves and the health of our patients.

## 2019-10-12 ENCOUNTER — Other Ambulatory Visit: Payer: Self-pay | Admitting: *Deleted

## 2019-10-12 ENCOUNTER — Encounter: Payer: Self-pay | Admitting: Adult Health

## 2019-10-12 LAB — CBC WITH DIFFERENTIAL/PLATELET
Basophils Absolute: 0 10*3/uL (ref 0.0–0.2)
Basos: 1 %
EOS (ABSOLUTE): 0.1 10*3/uL (ref 0.0–0.4)
Eos: 2 %
Hematocrit: 42.6 % (ref 34.0–46.6)
Hemoglobin: 14.4 g/dL (ref 11.1–15.9)
Immature Grans (Abs): 0 10*3/uL (ref 0.0–0.1)
Immature Granulocytes: 0 %
Lymphocytes Absolute: 1.2 10*3/uL (ref 0.7–3.1)
Lymphs: 24 %
MCH: 30 pg (ref 26.6–33.0)
MCHC: 33.8 g/dL (ref 31.5–35.7)
MCV: 89 fL (ref 79–97)
Monocytes Absolute: 0.6 10*3/uL (ref 0.1–0.9)
Monocytes: 11 %
Neutrophils Absolute: 3.2 10*3/uL (ref 1.4–7.0)
Neutrophils: 62 %
Platelets: 412 10*3/uL (ref 150–450)
RBC: 4.8 x10E6/uL (ref 3.77–5.28)
RDW: 14.3 % (ref 11.7–15.4)
WBC: 5 10*3/uL (ref 3.4–10.8)

## 2019-10-12 LAB — COMPREHENSIVE METABOLIC PANEL
ALT: 14 IU/L (ref 0–32)
AST: 17 IU/L (ref 0–40)
Albumin/Globulin Ratio: 2 (ref 1.2–2.2)
Albumin: 4.5 g/dL (ref 3.8–4.8)
Alkaline Phosphatase: 107 IU/L (ref 48–121)
BUN/Creatinine Ratio: 22 (ref 12–28)
BUN: 14 mg/dL (ref 8–27)
Bilirubin Total: 0.6 mg/dL (ref 0.0–1.2)
CO2: 26 mmol/L (ref 20–29)
Calcium: 9.5 mg/dL (ref 8.7–10.3)
Chloride: 104 mmol/L (ref 96–106)
Creatinine, Ser: 0.63 mg/dL (ref 0.57–1.00)
GFR calc Af Amer: 105 mL/min/{1.73_m2} (ref >59)
GFR calc non Af Amer: 91 mL/min/{1.73_m2} (ref >59)
Globulin, Total: 2.2 g/dL (ref 1.5–4.5)
Glucose: 87 mg/dL (ref 65–99)
Potassium: 4.2 mmol/L (ref 3.5–5.2)
Sodium: 143 mmol/L (ref 134–144)
Total Protein: 6.7 g/dL (ref 6.0–8.5)

## 2019-10-12 MED ORDER — GABAPENTIN 400 MG PO CAPS: 400.0000 mg | ORAL_CAPSULE | Freq: Three times a day (TID) | ORAL | 11 refills | Status: DC

## 2019-10-13 DIAGNOSIS — Z1231 Encounter for screening mammogram for malignant neoplasm of breast: Secondary | ICD-10-CM | POA: Diagnosis not present

## 2019-10-17 DIAGNOSIS — E785 Hyperlipidemia, unspecified: Secondary | ICD-10-CM | POA: Diagnosis not present

## 2019-10-17 DIAGNOSIS — M1712 Unilateral primary osteoarthritis, left knee: Secondary | ICD-10-CM | POA: Diagnosis not present

## 2019-10-17 DIAGNOSIS — F4321 Adjustment disorder with depressed mood: Secondary | ICD-10-CM | POA: Diagnosis not present

## 2019-10-17 DIAGNOSIS — I10 Essential (primary) hypertension: Secondary | ICD-10-CM | POA: Diagnosis not present

## 2019-10-17 DIAGNOSIS — F325 Major depressive disorder, single episode, in full remission: Secondary | ICD-10-CM | POA: Diagnosis not present

## 2019-10-17 DIAGNOSIS — D508 Other iron deficiency anemias: Secondary | ICD-10-CM | POA: Diagnosis not present

## 2019-10-18 ENCOUNTER — Telehealth: Payer: Self-pay | Admitting: Adult Health

## 2019-10-18 NOTE — Telephone Encounter (Signed)
NP suggested to Pt there may be an organization would help with TECFIDERA [address]. Pt need name of organization ASAP, only have a couple of days left of medication.

## 2019-10-18 NOTE — Telephone Encounter (Signed)
I called pt back.  Tecfidera PAP ended 10-17-19.  She has 2 days left of medication.  She has been in touch with [employer] but none have open enrollment at this time.  She states that she did try dimethyl fumarate and she did ok on this, but of course they have no financial asistance.  Did you want to enroll in Vumerity.  Please advise.

## 2019-10-19 NOTE — Telephone Encounter (Signed)
Ok to switch to [employer]

## 2019-10-19 NOTE — Telephone Encounter (Signed)
Andrea Cantrell,   Can you get this patient started on Vumerity

## 2019-10-19 NOTE — Telephone Encounter (Signed)
Dr. Krista Blue are you in agreement with switching this patient to Vumerity? She can no longer get patient assistance for Tecfidera

## 2019-10-24 NOTE — Telephone Encounter (Signed)
Pt signed start form for vumerity. Start form faxed to [employer]. Received a receipt of confirmation.

## 2019-10-24 NOTE — Telephone Encounter (Signed)
I called pt. She will need to sign the start form for vumerity. She asked that I send it to her mychart. She will review, sign and scan it back.

## 2019-10-24 NOTE — Telephone Encounter (Signed)
Pt was unable to complete vumerity form online but will come by the office to sign the start form today. Form placed at front desk.

## 2019-10-25 ENCOUNTER — Telehealth: Payer: Self-pay | Admitting: Adult Health

## 2019-10-25 NOTE — Telephone Encounter (Signed)
I called [employer], spoke to pharmacist, verified Vumerity RX. They will do an benefits investigation and contact the pt.

## 2019-10-25 NOTE — Telephone Encounter (Signed)
Andrea Cantrell) called  to verify prescription for Vumerity. Would like a call from the nurse.

## 2019-10-25 NOTE — Telephone Encounter (Signed)
Completed PA for Vumerity 231mg  2 capsules BID via CMM. Sent to [employer]. Key: [id number]. "[employer] has received your information, and the request will be reviewed. You may close this dialog, return to your dashboard, and perform other tasks.  You will receive an electronic determination in CoverMyMeds. You can see the latest determination by locating this request on your dashboard or by reopening this request. You will also receive a faxed copy of the determination. If you have any questions please contact [employer] at [phone]."

## 2019-10-30 NOTE — Telephone Encounter (Signed)
Informed Crystal at [facility]

## 2019-10-30 NOTE — Telephone Encounter (Signed)
Received vumerity approval on CMM from [employer]: " PA Case: [id number], Status: Approved, Coverage Starts on: 10/27/2019 12:00:00 AM, Coverage Ends on: 02/16/2020 12:00:00 AM."

## 2019-10-31 DIAGNOSIS — M25562 Pain in left knee: Secondary | ICD-10-CM | POA: Diagnosis not present

## 2019-10-31 DIAGNOSIS — M25561 Pain in right knee: Secondary | ICD-10-CM | POA: Diagnosis not present

## 2019-10-31 DIAGNOSIS — M1711 Unilateral primary osteoarthritis, right knee: Secondary | ICD-10-CM | POA: Diagnosis not present

## 2019-10-31 DIAGNOSIS — M1712 Unilateral primary osteoarthritis, left knee: Secondary | ICD-10-CM | POA: Diagnosis not present

## 2019-11-01 ENCOUNTER — Other Ambulatory Visit: Payer: Self-pay | Admitting: Diagnostic Neuroimaging

## 2019-11-01 ENCOUNTER — Telehealth: Payer: Self-pay | Admitting: Adult Health

## 2019-11-01 NOTE — Telephone Encounter (Signed)
Prescription in RX refill request.  Sent to MM/NP to address.

## 2019-11-01 NOTE — Telephone Encounter (Signed)
Amanda @  [facility] has called for  traMADol (ULTRAM) 50 MG tablet to [facility]

## 2019-11-06 NOTE — Telephone Encounter (Signed)
I called pt. She started vumerity on Saturday. She is tolerating it well. I reminded her that after 1 week she should increase vumerity to 2 capsules BID. I advised her to call us back with questions or concerns.

## 2019-11-06 NOTE — Addendum Note (Signed)
Addended by: Lester [place] A on: 11/06/2019 04:48 PM   Modules accepted: Orders

## 2019-11-06 NOTE — Telephone Encounter (Signed)
Received notice from [place] that the Free Drug Program shipped pt's vumerity from [facility].  [place] [phone] [phone]

## 2019-11-10 ENCOUNTER — Other Ambulatory Visit: Payer: PPO

## 2019-11-16 DIAGNOSIS — D508 Other iron deficiency anemias: Secondary | ICD-10-CM | POA: Diagnosis not present

## 2019-11-16 DIAGNOSIS — I10 Essential (primary) hypertension: Secondary | ICD-10-CM | POA: Diagnosis not present

## 2019-11-16 DIAGNOSIS — M1712 Unilateral primary osteoarthritis, left knee: Secondary | ICD-10-CM | POA: Diagnosis not present

## 2019-11-16 DIAGNOSIS — E785 Hyperlipidemia, unspecified: Secondary | ICD-10-CM | POA: Diagnosis not present

## 2019-11-16 DIAGNOSIS — F325 Major depressive disorder, single episode, in full remission: Secondary | ICD-10-CM | POA: Diagnosis not present

## 2019-11-16 DIAGNOSIS — F4321 Adjustment disorder with depressed mood: Secondary | ICD-10-CM | POA: Diagnosis not present

## 2019-11-19 ENCOUNTER — Other Ambulatory Visit: Payer: Self-pay

## 2019-11-19 ENCOUNTER — Ambulatory Visit
Admission: RE | Admit: 2019-11-19 | Discharge: 2019-11-19 | Disposition: A | Discharge status: Home / Self Care | Payer: PPO | Source: Ambulatory Visit | Attending: Adult Health | Admitting: Adult Health

## 2019-11-19 DIAGNOSIS — G35 Multiple sclerosis: Secondary | ICD-10-CM

## 2019-11-19 MED ORDER — GADOBENATE DIMEGLUMINE 529 MG/ML IV SOLN
17.0000 mL | Freq: Once | INTRAVENOUS | Status: AC | PRN
  Administered 2019-11-19: 17 mL via INTRAVENOUS

## 2019-12-05 ENCOUNTER — Other Ambulatory Visit: Payer: Self-pay | Admitting: Adult Health

## 2019-12-05 DIAGNOSIS — M1712 Unilateral primary osteoarthritis, left knee: Secondary | ICD-10-CM | POA: Diagnosis not present

## 2019-12-05 DIAGNOSIS — I10 Essential (primary) hypertension: Secondary | ICD-10-CM | POA: Diagnosis not present

## 2019-12-05 DIAGNOSIS — F325 Major depressive disorder, single episode, in full remission: Secondary | ICD-10-CM | POA: Diagnosis not present

## 2019-12-05 DIAGNOSIS — D508 Other iron deficiency anemias: Secondary | ICD-10-CM | POA: Diagnosis not present

## 2019-12-05 DIAGNOSIS — E785 Hyperlipidemia, unspecified: Secondary | ICD-10-CM | POA: Diagnosis not present

## 2019-12-05 DIAGNOSIS — F4321 Adjustment disorder with depressed mood: Secondary | ICD-10-CM | POA: Diagnosis not present

## 2019-12-05 MED ORDER — TRAMADOL HCL 50 MG PO TABS
50.0000 mg | ORAL_TABLET | Freq: Two times a day (BID) | ORAL | 0 refills | Status: DC | PRN
Start: 2019-12-05 — End: 2019-12-26

## 2019-12-05 NOTE — Telephone Encounter (Signed)
Pt is needing a refill on her traMADol (ULTRAM) 50 MG tablet sent in to the [facility]  

## 2019-12-14 ENCOUNTER — Other Ambulatory Visit: Payer: Self-pay

## 2019-12-14 ENCOUNTER — Encounter: Payer: Self-pay | Admitting: Cardiology

## 2019-12-14 ENCOUNTER — Ambulatory Visit (INDEPENDENT_AMBULATORY_CARE_PROVIDER_SITE_OTHER): Payer: PPO | Admitting: Cardiology

## 2019-12-14 VITALS — BP 118/60 | HR 70 | Ht 61.5 in | Wt 183.6 lb

## 2019-12-14 DIAGNOSIS — R0600 Dyspnea, unspecified: Secondary | ICD-10-CM | POA: Diagnosis not present

## 2019-12-14 DIAGNOSIS — E669 Obesity, unspecified: Secondary | ICD-10-CM

## 2019-12-14 DIAGNOSIS — Z716 Tobacco abuse counseling: Secondary | ICD-10-CM

## 2019-12-14 DIAGNOSIS — E785 Hyperlipidemia, unspecified: Secondary | ICD-10-CM

## 2019-12-14 DIAGNOSIS — R002 Palpitations: Secondary | ICD-10-CM | POA: Diagnosis not present

## 2019-12-14 DIAGNOSIS — I10 Essential (primary) hypertension: Secondary | ICD-10-CM

## 2019-12-14 DIAGNOSIS — R0609 Other forms of dyspnea: Secondary | ICD-10-CM

## 2019-12-14 NOTE — Patient Instructions (Addendum)

## 2019-12-14 NOTE — Progress Notes (Signed)
Primary Care Provider: Harlan Stains, MD Cardiologist: Glenetta Hew, MD Electrophysiologist: None  Clinic Note: Chief Complaint  Patient presents with  . Follow-up    2 years  . Palpitations    Off and on, random short-lived   HPI:    Andrea Cantrell is a 69 y.o. female with a PMH notable for discoid lupus, fibromyalgia, spinal stenosis, hypertension, hyperlipidemia as well as multiple sclerosis who presents today for 2-year follow-up.  She was initially seen in October 2015 for chest pain and exertional dyspnea evaluation--> nonischemic Myoview.   Problem List Items Addressed This Visit    Essential hypertension - Primary (Chronic)    Blood pressure looks well controlled today on amlodipine.  No change.      Relevant Orders   EKG 12-Lead (Completed)   Hyperlipidemia with target LDL less than 130 (Chronic)    Remains on low-dose atorvastatin.  LDL is 75.  Pretty well controlled given her risk factors.  No change.      Tobacco abuse counseling (Chronic)    Regarding smoking cessation.  She says she has cut down a bit is trying to fully quit.  She does have a hard time making a final step.      Palpitations (Chronic)    Random episodes of palpitation that are not really associated with any significant symptoms.  Based on how rare they are, and how short-lived they are probably err on the side of not trying to treat.  If they get worse, we can consider monitor.      Obesity (BMI 35.0-39.9 without comorbidity) ([place]) (Chronic)    Very much limited from exercise standpoint, therefore she will need to monitor her dietary intake.      DOE (dyspnea on exertion)    At this point it sounds like but really because her dyspnea is more related to the level of effort that takes her to do things.        Andrea Cantrell was last seen on August 18, 2017 -> she was doing well from cardiac standpoint.  No major issues.  Occasional numbness in the left arm starting her back up and  down left arm but not associated with any activity.  No association with other symptoms.  Occasional palpitations also with no association.  Difficult to do any walking because of spinal stenosis and knee pain.  Recent Hospitalization: None  Reviewed  CV studies:    The following studies were reviewed today: (if available, images/films reviewed: From Epic Chart or Care Everywhere) . None:   Interval History:   Andrea Cantrell returns for 2-year follow-up overall doing pretty well from cardiac standpoint.  Most notable event occurred a little while ago when she had a change to her multiple sclerosis medication.  She felt very strange and, unusual.  She was in her balance really great fatigue.  She felt as though she may have a passout spell.  However once medication was changed back, she has been doing fine.  She notes random off-and-on heart palpitations without any association to activity.  Episodes all last very long has mostly just irregular heartbeats not true rhythm.  She had a spell actually a few nights ago associated with coughing.  Improved with drinking water.  These episodes happen she may feel a little bit dizzy or woozy, but no syncope or near syncope.  She still has the numbness and discomfort on the left arm.  No edema.  Not really having any exertional dyspnea,  just that doing physical activity is laborious to her.  CV Review of Symptoms (Summary): positive for - irregular heartbeat, palpitations and Occasional dizziness/wooziness.  Exertional dyspnea mostly because of how much effort it takes to walk. negative for - chest pain, edema, orthopnea, paroxysmal nocturnal dyspnea, rapid heart rate, shortness of breath or Syncope/near syncope, TIA/amaurosis fugax, claudication  The patient does not have symptoms concerning for COVID-19 infection (fever, chills, cough, or new shortness of breath).   REVIEWED OF SYSTEMS   Review of Systems  Constitutional: Positive for  malaise/fatigue (Probably more is results of lack of doing anything.  More deconditioning.). Negative for weight loss.  HENT: Negative for congestion.   Respiratory: Positive for cough. Negative for sputum production (Off and on.  Not usually) and shortness of breath (Only with exertion).   Gastrointestinal: Negative for blood in stool and melena.  Genitourinary: Negative for hematuria.  Musculoskeletal: Positive for back pain, falls, joint pain and myalgias.  Neurological: Positive for dizziness, tingling (Numbness and tingling that can sometimes developed mostly left also sometimes in the right arm) and weakness (Legs feel weak). Negative for focal weakness and headaches.  Endo/Heme/Allergies: Positive for environmental allergies (Causes congestion and cough).  Psychiatric/Behavioral: Negative for memory loss and substance abuse. The patient is nervous/anxious. The patient does not have insomnia (Hard to get back to sleep if she wakes up).    I have reviewed and (if needed) personally updated the patient's problem list, medications, allergies, past medical and surgical history, social and family history.   PAST MEDICAL HISTORY   Past Medical History:  Diagnosis Date  . Discoid lupus   . Essential hypertension   . Fibromyalgia   . Heavy smoker (more than 20 cigarettes per day)    Was able to stop for 11 years but restarted about 10 years ago  . Hyperlipidemia with target LDL less than 130   . MS (multiple sclerosis) ([place])   . Obesity (BMI 35.0-39.9 without comorbidity)   . Spinal stenosis     PAST SURGICAL HISTORY   Past Surgical History:  Procedure Laterality Date  . ABDOMINAL HYSTERECTOMY  1995  . BREAST SURGERY Left    tissue removal  . [place]  . CHOLECYSTECTOMY  1994  . INCISIONAL HERNIA REPAIR N/A 12/09/2016   Procedure: LAPAROSCOPIC INCISIONAL HERNIA REPAIR WITH MESH;  Surgeon: Excell Seltzer, MD;  Location: [facility];  Service: General;   Laterality: N/A;  . NM MYOVIEW LTD  October 2015   EF 71%. No ischemia or infarction. Low risk/normal  . ROTATOR CUFF REPAIR Left June 2016  . TRANSTHORACIC ECHOCARDIOGRAM  07/2016    (To evaluate murmur).  Normal LV size and function.  EF 60-65%.  No RWMA.  Normal diastolic function. No comment on valvular disease. -- NORMAL ECHO     There is no immunization history on file for this patient.  MEDICATIONS/ALLERGIES   Current Meds  Medication Sig  . albuterol (PROVENTIL HFA;VENTOLIN HFA) 108 (90 BASE) MCG/ACT inhaler Inhale 2 puffs into the lungs every 6 (six) hours as needed for wheezing or shortness of breath (wheezing and shortness of breath). For shortness of breath.  Marland Kitchen amLODipine (NORVASC) 5 MG tablet Take 5 mg by mouth every morning.   Marland Kitchen aspirin EC 81 MG tablet Take 81 mg by mouth every evening.   Marland Kitchen atorvastatin (LIPITOR) 10 MG tablet Take 10 mg by mouth every morning.   . baclofen (LIORESAL) 10 MG tablet TAKE 1 TABLET (10 MG  TOTAL) BY MOUTH 3 (THREE) TIMES DAILY.  . Calcium Carbonate (CALCIUM 600 PO) Take 600 mg by mouth in the morning and at bedtime.  . cholecalciferol (VITAMIN D) 25 MCG (1000 UNIT) tablet Take 1,000 Units by mouth daily.  . diphenhydrAMINE-APAP, sleep, (TYLENOL PM EXTRA STRENGTH PO) Take 1 tablet by mouth at bedtime.  . Diroximel Fumarate (VUMERITY) 231 MG CPDR Take 462 mg by mouth 2 (two) times daily.  Marland Kitchen gabapentin (NEURONTIN) 400 MG capsule Take 1 capsule (400 mg total) by mouth 3 (three) times daily.  Marland Kitchen ibuprofen (ADVIL,MOTRIN) 200 MG tablet Take 600 mg by mouth every 6 (six) hours as needed for moderate pain.  Marland Kitchen levalbuterol (XOPENEX HFA) 45 MCG/ACT inhaler Inhale 2 puffs into the lungs every 6 (six) hours.  . polyethylene glycol (MIRALAX / GLYCOLAX) packet Uses as needed for constipation at home.  He can buy this over-the-counter at any drugstore.  Follow package instructions.  . traMADol (ULTRAM) 50 MG tablet Take 1 tablet (50 mg total) by mouth 2 (two)  times daily as needed.    No Known Allergies  SOCIAL HISTORY/FAMILY HISTORY   Reviewed in Epic:  Pertinent findings: Nothing new  OBJCTIVE -PE, EKG, labs   Wt Readings from Last 3 Encounters:  12/14/19 183 lb 9.6 oz (83.3 kg)  10/11/19 180 lb (81.6 kg)  04/05/19 184 lb 6.4 oz (83.6 kg)    Physical Exam: BP 118/60   Pulse 70   Ht 5' 1.5" (1.562 m)   Wt 183 lb 9.6 oz (83.3 kg)   BMI 34.13 kg/m  Physical Exam Constitutional:      Appearance: Normal appearance. She is obese. She is not ill-appearing.  HENT:     Head: Normocephalic and atraumatic.  Neck:     Vascular: No carotid bruit or JVD.  Cardiovascular:     Rate and Rhythm: Normal rate and regular rhythm.  Extrasystoles are present.    Chest Wall: PMI is not displaced.     Pulses: Decreased pulses (Mildly diminished pedal pulses.).     Heart sounds: Murmur heard.  Medium-pitched harsh crescendo-decrescendo midsystolic murmur is present with a grade of 1/6 at the upper right sternal border radiating to the neck.  No friction rub. No gallop.   Pulmonary:     Effort: Pulmonary effort is normal. No respiratory distress.     Breath sounds: Wheezing (Mild late expiratory wheeze with forced expiration.) present.  Chest:     Chest wall: No tenderness.  Musculoskeletal:        General: Swelling (Trivial bilateral LE) present. Normal range of motion.     Cervical back: Normal range of motion and neck supple.  Neurological:     General: No focal deficit present.     Mental Status: She is alert and oriented to person, place, and time. Mental status is at baseline.  Psychiatric:        Mood and Affect: Mood normal.        Behavior: Behavior normal.        Thought Content: Thought content normal.        Judgment: Judgment normal.      Adult ECG Report  Rate: 70 ;  Rhythm: normal sinus rhythm, premature atrial contractions (PAC) and Otherwise normal axis, normal durations.;   Narrative Interpretation: Stable  EKG.  Recent Labs:   11/06/2019: TC 94, TG 65, HDL 97, LDL-C 75; Hgb 14 4. Cr 0.63, K+ 4.2 No results found for: CHOL, HDL, LDLCALC, LDLDIRECT, TRIG, CHOLHDL  Lab Results  Component Value Date   CREATININE 0.63 10/11/2019   BUN 14 10/11/2019   NA 143 10/11/2019   K 4.2 10/11/2019   CL 104 10/11/2019   CO2 26 10/11/2019   Lab Results  Component Value Date   TSH 1.200 06/30/2018    ASSESSMENT/PLAN   Problem List Items Addressed This Visit    Essential hypertension - Primary (Chronic)    Blood pressure looks well controlled today on amlodipine.  No change.      Relevant Orders   EKG 12-Lead (Completed)   Hyperlipidemia with target LDL less than 130 (Chronic)    Remains on low-dose atorvastatin.  LDL is 75.  Pretty well controlled given her risk factors.  No change.      Tobacco abuse counseling (Chronic)    Regarding smoking cessation.  She says she has cut down a bit is trying to fully quit.  She does have a hard time making a final step.      Palpitations (Chronic)    Random episodes of palpitation that are not really associated with any significant symptoms.  Based on how rare they are, and how short-lived they are probably err on the side of not trying to treat.  If they get worse, we can consider monitor.      Obesity (BMI 35.0-39.9 without comorbidity) ([place]) (Chronic)    Very much limited from exercise standpoint, therefore she will need to monitor her dietary intake.      DOE (dyspnea on exertion)    At this point it sounds like but really because her dyspnea is more related to the level of effort that takes her to do things.         COVID-19 Education: The signs and symptoms of COVID-19 were discussed with the patient and how to seek care for testing (follow up with PCP or arrange E-visit).   The importance of social distancing and COVID-19 vaccination was discussed today.  The patient is practicing social distancing & Masking.   I spent a total of  32minutes with the patient spent in direct patient consultation.  Additional time spent with chart review  / charting (studies, outside notes, etc): 9 Total Time: 27 min   Current medicines are reviewed at length with the patient today.  (+/- concerns) n/a  This visit occurred during the SARS-CoV-2 public health emergency.  Safety protocols were in place, including screening questions prior to the visit, additional usage of staff PPE, and extensive cleaning of exam room while observing appropriate contact time as indicated for disinfecting solutions.  Notice: This dictation was prepared with Dragon dictation along with smaller phrase technology. Any transcriptional errors that result from this process are unintentional and may not be corrected upon review.  Patient Instructions / Medication Changes & Studies & Tests Ordered   Patient Instructions  Medication Instructions:  No changes  *If you need a refill on your cardiac medications before your next appointment, please call your pharmacy*   Lab Work: Not needed If you have labs (blood work) drawn today and your tests are completely normal, you will receive your results only by: Marland Kitchen MyChart Message (if you have MyChart) OR . A paper copy in the mail If you have any lab test that is abnormal or we need to change your treatment, we will call you to review the results.   Testing/Procedures: Not needed   Follow-Up: At [facility], you and your health needs are our priority.  As part of  our continuing mission to provide you with exceptional heart care, we have created designated Provider Care Teams.  These Care Teams include your primary Cardiologist (physician) and Advanced Practice Providers (APPs -  Physician Assistants and Nurse Practitioners) who all work together to provide you with the care you need, when you need it.  We recommend signing up for the patient portal called "MyChart".  Sign up information is provided on this After  Visit Summary.  MyChart is used to connect with patients for Virtual Visits (Telemedicine).  Patients are able to view lab/test results, encounter notes, upcoming appointments, etc.  Non-urgent messages can be sent to your provider as well.   To learn more about what you can do with MyChart, go to [email or web address].    Your next appointment:   12 month(s)  The format for your next appointment:   In Person  Provider:   Glenetta Hew, MD    Studies Ordered:   Orders Placed This Encounter  Procedures  . EKG 12-Lead     Glenetta Hew, M.D., M.S. Interventional Cardiologist   Pager # [phone] Phone # [phone] [address] [place], [place] [address]   Thank you for choosing Heartcare at [facility]!!

## 2019-12-20 NOTE — Progress Notes (Signed)
I have reviewed and agreed above plan. 

## 2019-12-24 ENCOUNTER — Encounter: Payer: Self-pay | Admitting: Cardiology

## 2019-12-24 DIAGNOSIS — R002 Palpitations: Secondary | ICD-10-CM | POA: Insufficient documentation

## 2019-12-24 NOTE — Assessment & Plan Note (Signed)
Regarding smoking cessation.  She says she has cut down a bit is trying to fully quit.  She does have a hard time making a final step.

## 2019-12-24 NOTE — Assessment & Plan Note (Signed)
At this point it sounds like but really because her dyspnea is more related to the level of effort that takes her to do things.

## 2019-12-24 NOTE — Assessment & Plan Note (Signed)
Blood pressure looks well controlled today on amlodipine.  No change.

## 2019-12-24 NOTE — Assessment & Plan Note (Signed)
Random episodes of palpitation that are not really associated with any significant symptoms.  Based on how rare they are, and how short-lived they are probably err on the side of not trying to treat.  If they get worse, we can consider monitor.

## 2019-12-24 NOTE — Assessment & Plan Note (Signed)
Remains on low-dose atorvastatin.  LDL is 75.  Pretty well controlled given her risk factors.  No change.

## 2019-12-24 NOTE — Assessment & Plan Note (Signed)
Very much limited from exercise standpoint, therefore she will need to monitor her dietary intake.

## 2019-12-26 ENCOUNTER — Other Ambulatory Visit: Payer: Self-pay | Admitting: Adult Health

## 2019-12-26 NOTE — Telephone Encounter (Signed)
[facility](Amanda) request refill traMADol (ULTRAM) 50 MG tablet at [facility]

## 2019-12-26 NOTE — Telephone Encounter (Signed)
[place] Drug registry Verified LR:12/06/19 Qty: 83 for 30 Last OV:10/11/19 Pending appointment: 04/10/2020

## 2019-12-27 MED ORDER — TRAMADOL HCL 50 MG PO TABS
50.0000 mg | ORAL_TABLET | Freq: Two times a day (BID) | ORAL | 0 refills | Status: DC | PRN
Start: 2019-12-27 — End: 2020-02-06

## 2020-01-15 DIAGNOSIS — F325 Major depressive disorder, single episode, in full remission: Secondary | ICD-10-CM | POA: Diagnosis not present

## 2020-01-15 DIAGNOSIS — D508 Other iron deficiency anemias: Secondary | ICD-10-CM | POA: Diagnosis not present

## 2020-01-15 DIAGNOSIS — E785 Hyperlipidemia, unspecified: Secondary | ICD-10-CM | POA: Diagnosis not present

## 2020-01-15 DIAGNOSIS — I10 Essential (primary) hypertension: Secondary | ICD-10-CM | POA: Diagnosis not present

## 2020-01-15 DIAGNOSIS — F4321 Adjustment disorder with depressed mood: Secondary | ICD-10-CM | POA: Diagnosis not present

## 2020-01-15 DIAGNOSIS — M1712 Unilateral primary osteoarthritis, left knee: Secondary | ICD-10-CM | POA: Diagnosis not present

## 2020-01-30 DIAGNOSIS — M25561 Pain in right knee: Secondary | ICD-10-CM | POA: Diagnosis not present

## 2020-01-30 DIAGNOSIS — M170 Bilateral primary osteoarthritis of knee: Secondary | ICD-10-CM | POA: Diagnosis not present

## 2020-01-30 DIAGNOSIS — M25562 Pain in left knee: Secondary | ICD-10-CM | POA: Diagnosis not present

## 2020-02-01 ENCOUNTER — Other Ambulatory Visit: Payer: Self-pay | Admitting: Adult Health

## 2020-02-01 NOTE — Telephone Encounter (Signed)
Amanda from [facility] called to request refill for traMADol (ULTRAM) 50 MG tablet.  Best contact: [phone]

## 2020-02-01 NOTE — Addendum Note (Signed)
Addended by: Brandon Melnick on: 02/01/2020 04:50 PM   Modules accepted: Orders

## 2020-02-02 ENCOUNTER — Other Ambulatory Visit: Payer: Self-pay | Admitting: Adult Health

## 2020-02-05 NOTE — Telephone Encounter (Signed)
Lovey Newcomer did you check drug registry?

## 2020-02-06 NOTE — Telephone Encounter (Signed)
Pt is due for refill on tramadol. Pt is up to date on her appts.  Controlled Substance Registry checked and is appropriate.

## 2020-02-06 NOTE — Telephone Encounter (Signed)
Pt called wanting to know when her traMADol (ULTRAM) 50 MG tablet will be called in for her to the [facility]

## 2020-02-06 NOTE — Telephone Encounter (Signed)
I called pt. I advised her that the tramadol was sent in to [facility]. Pt verbalized understanding.

## 2020-02-08 DIAGNOSIS — Z23 Encounter for immunization: Secondary | ICD-10-CM | POA: Diagnosis not present

## 2020-02-12 DIAGNOSIS — F4321 Adjustment disorder with depressed mood: Secondary | ICD-10-CM | POA: Diagnosis not present

## 2020-02-12 DIAGNOSIS — M1712 Unilateral primary osteoarthritis, left knee: Secondary | ICD-10-CM | POA: Diagnosis not present

## 2020-02-12 DIAGNOSIS — E785 Hyperlipidemia, unspecified: Secondary | ICD-10-CM | POA: Diagnosis not present

## 2020-02-12 DIAGNOSIS — D508 Other iron deficiency anemias: Secondary | ICD-10-CM | POA: Diagnosis not present

## 2020-02-12 DIAGNOSIS — I10 Essential (primary) hypertension: Secondary | ICD-10-CM | POA: Diagnosis not present

## 2020-02-12 DIAGNOSIS — F325 Major depressive disorder, single episode, in full remission: Secondary | ICD-10-CM | POA: Diagnosis not present

## 2020-02-23 ENCOUNTER — Telehealth: Payer: Self-pay | Admitting: Adult Health

## 2020-02-23 NOTE — Telephone Encounter (Signed)
Pt called having sciatica nerve pain in hip and leg. Would like a call from the nurse to discuss getting early appt than 2/23.

## 2020-02-23 NOTE — Telephone Encounter (Signed)
I have spoken to the patient and her appt has been moved to an opening on 02/26/20.

## 2020-02-26 ENCOUNTER — Other Ambulatory Visit: Payer: Self-pay

## 2020-02-26 ENCOUNTER — Encounter: Payer: Self-pay | Admitting: Neurology

## 2020-02-26 ENCOUNTER — Telehealth: Payer: Self-pay | Admitting: Neurology

## 2020-02-26 ENCOUNTER — Ambulatory Visit: Payer: PPO | Admitting: Neurology

## 2020-02-26 VITALS — BP 119/67 | HR 57 | Ht 62.0 in | Wt 184.0 lb

## 2020-02-26 DIAGNOSIS — M25551 Pain in right hip: Secondary | ICD-10-CM

## 2020-02-26 DIAGNOSIS — M5416 Radiculopathy, lumbar region: Secondary | ICD-10-CM | POA: Diagnosis not present

## 2020-02-26 DIAGNOSIS — G35 Multiple sclerosis: Secondary | ICD-10-CM

## 2020-02-26 MED ORDER — BACLOFEN 10 MG PO TABS
10.0000 mg | ORAL_TABLET | Freq: Four times a day (QID) | ORAL | 4 refills | Status: DC
Start: 2020-02-26 — End: 2021-02-26

## 2020-02-26 MED ORDER — GABAPENTIN 400 MG PO CAPS
400.0000 mg | ORAL_CAPSULE | Freq: Four times a day (QID) | ORAL | 4 refills | Status: DC
Start: 2020-02-26 — End: 2021-05-15

## 2020-02-26 NOTE — Telephone Encounter (Signed)
health team order sent to GI. No auth they will reach out to the patient to schedule.  

## 2020-02-26 NOTE — Patient Instructions (Signed)
[facility]    Address: [address], [place], [place] [address]  Phone: [phone]   

## 2020-02-26 NOTE — Progress Notes (Addendum)
HISTORY OF PRESENT ILLNESS: [name] is a 70 year old female with a history of multiple sclerosis.  She returns today for follow-up.  She remains on Tecfidera.  She states that she has noticed more numbness and tingling in the left buttocks.  She states that if she touches this area she has discomfort.  In the past she has been diagnosed with spinal stenosis but has not followed up with that physician.  She is unsure of his name.  She denies any significant changes with her gait or balance.  No changes with the bowels or bladder.  Her last MRI of the brain was in 2019.  She remains on tramadol for pain.  HISTORY 04/05/19:  [name] is a 70 year old female with a history of multiple sclerosis.  She returns today for follow-up.  She remains on Tecfidera.  She denies any new numbness and tingling.  Reports that she has ongoing numbness in the left arm that comes intermittently.  Denies any changes with the bowels or bladder.  No significant changes with her gait or balance.  She did see an orthopedic in December who gave her injections in the knee.  She reports that this has been beneficial for her discomfort in the knees.  She continues to have back pain (severe spinal stenosis).  Denies any trouble with her vision.  She continues to take tramadol 50 to 100 mg a day.  She states that typically she takes 2 tablets every day.  She states that this has been beneficial for her ongoing discomfort.  In the past she has been at a pain clinic in [place].  She returns today for an evaluation.  Updated February 26, 2020: I personally reviewed MRI of the brain with and without contrast November 19, 2019: Multiple MS lesions, no contrast-enhancement, no change compared to previous scan on March 18, 2017,  Today she complains worsening right low back pain, radiating pain to right lower extremity, was seen by orthopedic surgeon Dr. Gladstone Cantrell in 2019, I personally reviewed CT myelogram in March 2019, evidence  of severe L4-5 facet arthrosis, severe spinal stenosis, right great than left foraminal stenosis, multilevel spinal stenosis, moderate to severe left foraminal stenosis at L3-4, moderate neuroforaminal stenosis L5-S1  She lost to follow-up with orthopedic surgeon Dr. Gladstone Cantrell due to family issues in 2019,  Since October 2021, she complains worst radiating pain from the right lower back to left lower extremity    REVIEW OF SYSTEMS: Out of a complete 14 system review of symptoms, the patient complains only of the following symptoms, and all other reviewed systems are negative.  See HPI  ALLERGIES: No Known Allergies  HOME MEDICATIONS: Outpatient Medications Prior to Visit  Medication Sig Dispense Refill  . albuterol (PROVENTIL HFA;VENTOLIN HFA) 108 (90 BASE) MCG/ACT inhaler Inhale 2 puffs into the lungs every 6 (six) hours as needed for wheezing or shortness of breath (wheezing and shortness of breath). For shortness of breath.    Marland Kitchen amLODipine (NORVASC) 5 MG tablet Take 5 mg by mouth every morning.     Marland Kitchen aspirin EC 81 MG tablet Take 81 mg by mouth every evening.     Marland Kitchen atorvastatin (LIPITOR) 10 MG tablet Take 10 mg by mouth every morning.     . baclofen (LIORESAL) 10 MG tablet TAKE 1 TABLET (10 MG TOTAL) BY MOUTH 3 (THREE) TIMES DAILY. 90 tablet 11  . Calcium Carbonate (CALCIUM 600 PO) Take 600 mg by mouth in the morning and at bedtime.    Marland Kitchen  cholecalciferol (VITAMIN D) 25 MCG (1000 UNIT) tablet Take 1,000 Units by mouth daily.    . diphenhydrAMINE-APAP, sleep, (TYLENOL PM EXTRA STRENGTH PO) Take 1 tablet by mouth at bedtime.    . Diroximel Fumarate (VUMERITY) 231 MG CPDR Take 462 mg by mouth 2 (two) times daily.    Marland Kitchen gabapentin (NEURONTIN) 400 MG capsule Take 1 capsule (400 mg total) by mouth 3 (three) times daily. 90 capsule 11  . ibuprofen (ADVIL,MOTRIN) 200 MG tablet Take 600 mg by mouth every 6 (six) hours as needed for moderate pain.    Marland Kitchen levalbuterol (XOPENEX HFA) 45 MCG/ACT inhaler  Inhale 2 puffs into the lungs every 6 (six) hours.    . polyethylene glycol (MIRALAX / GLYCOLAX) packet Uses as needed for constipation at home.  He can buy this over-the-counter at any drugstore.  Follow package instructions. 14 each 0  . traMADol (ULTRAM) 50 MG tablet TAKE ONE TABLET BY MOUTH TWICE DAILY AS NEEDED 60 tablet 0   No facility-administered medications prior to visit.    PAST MEDICAL HISTORY: Past Medical History:  Diagnosis Date  . Discoid lupus   . Essential hypertension   . Fibromyalgia   . Heavy smoker (more than 20 cigarettes per day)    Was able to stop for 11 years but restarted about 10 years ago  . Hyperlipidemia with target LDL less than 130   . MS (multiple sclerosis) ([place])   . Obesity (BMI 35.0-39.9 without comorbidity)   . Spinal stenosis     PAST SURGICAL HISTORY: Past Surgical History:  Procedure Laterality Date  . ABDOMINAL HYSTERECTOMY  1995  . BREAST SURGERY Left    tissue removal  . [place]  . CHOLECYSTECTOMY  1994  . INCISIONAL HERNIA REPAIR N/A 12/09/2016   Procedure: LAPAROSCOPIC INCISIONAL HERNIA REPAIR WITH MESH;  Surgeon: Excell Seltzer, MD;  Location: [facility];  Service: General;  Laterality: N/A;  . NM MYOVIEW LTD  October 2015   EF 71%. No ischemia or infarction. Low risk/normal  . ROTATOR CUFF REPAIR Left June 2016  . TRANSTHORACIC ECHOCARDIOGRAM  07/2016    (To evaluate murmur).  Normal LV size and function.  EF 60-65%.  No RWMA.  Normal diastolic function. No comment on valvular disease. -- NORMAL ECHO    FAMILY HISTORY: Family History  Problem Relation Age of Onset  . Cancer Mother   . Cancer Father   . Diabetes Brother   . Diabetes Maternal Grandmother     SOCIAL HISTORY: Social History   Socioeconomic History  . Marital status: Legally Separated    Spouse name: Not on file  . Number of children: 2  . Years of education: 12+  . Highest education level: Not on file  Occupational History  .  Not on file  Tobacco Use  . Smoking status: Current Every Day Smoker    Packs/day: 1.00    Types: Cigarettes  . Smokeless tobacco: Never Used  . Tobacco comment: now maybe 3/4 pack a day starting 02/25/20.  Vaping Use  . Vaping Use: Never used  Substance and Sexual Activity  . Alcohol use: No  . Drug use: No  . Sexual activity: Not on file  Other Topics Concern  . Not on file  Social History Narrative   Patient lives at home with her son.   Patient is separated. Patient has 2 children.    Patient has some college. Patient is on Disability.    Smokes roughly one  pack a day. No alcohol.   Social Determinants of Health   Financial Resource Strain: Not on file  Food Insecurity: Not on file  Transportation Needs: Not on file  Physical Activity: Not on file  Stress: Not on file  Social Connections: Not on file  Intimate Partner Violence: Not on file      PHYSICAL EXAM  Vitals:   02/26/20 1401  BP: 119/67  Pulse: (!) 57  Weight: 184 lb (83.5 kg)  Height: 5\' 2"  (1.575 m)   Body mass index is 33.65 kg/m.  PHYSICAL EXAMNIATION:  Gen: NAD, conversant, well nourised, well groomed                     Cardiovascular: Regular rate rhythm, no peripheral edema, warm, nontender. Eyes: Conjunctivae clear without exudates or hemorrhage Neck: Supple, no carotid bruits. Pulmonary: Clear to auscultation bilaterally   NEUROLOGICAL EXAM:  MENTAL STATUS: Speech/Cognition: Awake, alert, normal speech, oriented to history taking and casual conversation.  CRANIAL NERVES: CN II: Visual fields are full to confrontation.  Pupils are round equal and briskly reactive to light. CN III, IV, VI: extraocular movement are normal. No ptosis. CN V: Facial sensation is intact to light touch. CN VII: Face is symmetric with normal eye closure and smile. CN VIII: Hearing is normal to casual conversation CN IX, X: Palate elevates symmetrically. Phonation is normal. CN XI: Head turning and shoulder  shrug are intact CN XII: Tongue is midline with normal movements and no atrophy.  MOTOR: She has mild right toe extension, flexion weakness,  REFLEXES: Reflexes are 2  and symmetric at the biceps, triceps, knees and absent at ankles. Plantar responses are flexor.  SENSORY: Intact to light touch, pinprick, positional and vibratory sensation at fingers and toes.  COORDINATION: There is no trunk or limb ataxia.    GAIT/STANCE: She needs a push-up to get up from seated position, mildly antalgic, steady   DIAGNOSTIC DATA (LABS, IMAGING, TESTING) - I reviewed patient records, labs, notes, testing and imaging myself where available.  Lab Results  Component Value Date   WBC 5.0 10/11/2019   HGB 14.4 10/11/2019   HCT 42.6 10/11/2019   MCV 89 10/11/2019   PLT 412 10/11/2019      Component Value Date/Time   NA 143 10/11/2019 1155   K 4.2 10/11/2019 1155   CL 104 10/11/2019 1155   CO2 26 10/11/2019 1155   GLUCOSE 87 10/11/2019 1155   GLUCOSE 91 12/11/2016 0507   BUN 14 10/11/2019 1155   CREATININE 0.63 10/11/2019 1155   CALCIUM 9.5 10/11/2019 1155   PROT 6.7 10/11/2019 1155   ALBUMIN 4.5 10/11/2019 1155   AST 17 10/11/2019 1155   ALT 14 10/11/2019 1155   ALKPHOS 107 10/11/2019 1155   BILITOT 0.6 10/11/2019 1155   GFRNONAA 91 10/11/2019 1155   GFRAA 105 10/11/2019 1155   No results found for: CHOL, HDL, LDLCALC, LDLDIRECT, TRIG, CHOLHDL No results found for: HGBA1C No results found for: VITAMINB12 Lab Results  Component Value Date   TSH 1.200 06/30/2018      ASSESSMENT AND PLAN 70 y.o. year old female   Relapsing remitting multiple sclerosis  Stable  Is on Vumerity 231 mg 2 tablets twice a day since November 06, 2019,  Lumbar radiculopathy, right hip pain,  X-ray of right hip to rule out right hip pathology  Repeat MRI of lumbar spine  Refilled gabapentin 400 mg 4 times a day, baclofen, 10 mg 4 times  a day,,   Marcial Pacas, M.D. Ph.D.  [facility] [place], [place] [address] Phone: [phone] Fax:      [phone]  Addendum: Q for orthopedic evaluation by Dr. Jacelyn Grip on March 26, 2020,  Severe right hip degenerative joint disease, MRI of lumbar was described as worsening degenerative joint disease with disc protrusion at L3-4, L4-5, moderate central canal stenosis, moderate bilateral foraminal and lateral recess stenosis, mainly affecting right L4 nerve roots, to help clarify the main cause of her complaints of right low back, hip pain, scheduled to have right L4-5 transforaminal epidural steroid injection, if her right leg pain subsided, then may also need right hip intra-articular steroid injection, if hip injection not last for long period of time, may need possible right hip replacement,

## 2020-02-28 ENCOUNTER — Telehealth: Payer: Self-pay | Admitting: *Deleted

## 2020-02-28 ENCOUNTER — Encounter: Payer: Self-pay | Admitting: Neurology

## 2020-02-28 NOTE — Telephone Encounter (Signed)
Received request to renew Vumerity PA on Cover My Meds. Key: [id number]. I started the PA and received this message from plan: Available without authorization.

## 2020-03-02 ENCOUNTER — Ambulatory Visit
Admission: RE | Admit: 2020-03-02 | Discharge: 2020-03-02 | Disposition: A | Discharge status: Home / Self Care | Payer: PPO | Source: Ambulatory Visit | Attending: Neurology | Admitting: Neurology

## 2020-03-02 ENCOUNTER — Other Ambulatory Visit: Payer: Self-pay

## 2020-03-02 DIAGNOSIS — G35 Multiple sclerosis: Secondary | ICD-10-CM | POA: Diagnosis not present

## 2020-03-02 DIAGNOSIS — M5416 Radiculopathy, lumbar region: Secondary | ICD-10-CM | POA: Diagnosis not present

## 2020-03-05 ENCOUNTER — Telehealth: Payer: Self-pay | Admitting: Neurology

## 2020-03-05 NOTE — Telephone Encounter (Signed)
  IMPRESSION: Abnormal MRI scan lumbar spine showing prominent disc and facet degenerative changes throughout most severe at L3-4 and L4-5 where there is mild canal and severe bilateral foraminal narrowing with likely encroachment on the exiting nerve roots.  There is also moderate bilateral foraminal narrowing at L5-S1.  Compared with previous MRI from 2/26 or 2016 these regional changes appear to have progressed.  I called patient Andrea Cantrell MRI of the lumbar spine report, multilevel degenerative changes, most severe at L3-4, L4-5, mild canal, severe bilateral foraminal narrowing, likely encroachment on the exiting nerve roots,  Compared to previous MRI in 2016, there is some progression  I also advised Andrea Cantrell to get x-ray of right hip as previously ordered, may consider epidural injection if she had persistent right-sided low back pain, radiating pain to right hip, and after rule out right hip pathology.

## 2020-03-07 ENCOUNTER — Other Ambulatory Visit: Payer: Self-pay | Admitting: Neurology

## 2020-03-07 DIAGNOSIS — M1712 Unilateral primary osteoarthritis, left knee: Secondary | ICD-10-CM | POA: Diagnosis not present

## 2020-03-07 DIAGNOSIS — E785 Hyperlipidemia, unspecified: Secondary | ICD-10-CM | POA: Diagnosis not present

## 2020-03-07 DIAGNOSIS — D508 Other iron deficiency anemias: Secondary | ICD-10-CM | POA: Diagnosis not present

## 2020-03-07 DIAGNOSIS — I10 Essential (primary) hypertension: Secondary | ICD-10-CM | POA: Diagnosis not present

## 2020-03-07 DIAGNOSIS — F4321 Adjustment disorder with depressed mood: Secondary | ICD-10-CM | POA: Diagnosis not present

## 2020-03-07 DIAGNOSIS — F325 Major depressive disorder, single episode, in full remission: Secondary | ICD-10-CM | POA: Diagnosis not present

## 2020-03-07 MED ORDER — TRAMADOL HCL 50 MG PO TABS
50.0000 mg | ORAL_TABLET | Freq: Two times a day (BID) | ORAL | 0 refills | Status: DC | PRN
Start: 2020-03-07 — End: 2020-03-13

## 2020-03-07 NOTE — Telephone Encounter (Signed)
Pt called stating she is needing a refill on her traMADol (ULTRAM) 50 MG tablet sent in to the [facility].

## 2020-03-11 ENCOUNTER — Ambulatory Visit
Admission: RE | Admit: 2020-03-11 | Discharge: 2020-03-11 | Disposition: A | Discharge status: Home / Self Care | Payer: PPO | Source: Ambulatory Visit | Attending: Neurology | Admitting: Neurology

## 2020-03-11 ENCOUNTER — Other Ambulatory Visit: Payer: Self-pay | Admitting: Neurology

## 2020-03-11 ENCOUNTER — Telehealth: Payer: Self-pay | Admitting: Neurology

## 2020-03-11 ENCOUNTER — Other Ambulatory Visit: Payer: Self-pay

## 2020-03-11 DIAGNOSIS — M25551 Pain in right hip: Secondary | ICD-10-CM

## 2020-03-11 DIAGNOSIS — M533 Sacrococcygeal disorders, not elsewhere classified: Secondary | ICD-10-CM | POA: Diagnosis not present

## 2020-03-11 DIAGNOSIS — M5416 Radiculopathy, lumbar region: Secondary | ICD-10-CM

## 2020-03-11 DIAGNOSIS — I878 Other specified disorders of veins: Secondary | ICD-10-CM | POA: Diagnosis not present

## 2020-03-11 DIAGNOSIS — G35 Multiple sclerosis: Secondary | ICD-10-CM

## 2020-03-11 DIAGNOSIS — M5136 Other intervertebral disc degeneration, lumbar region: Secondary | ICD-10-CM | POA: Diagnosis not present

## 2020-03-11 DIAGNOSIS — M1611 Unilateral primary osteoarthritis, right hip: Secondary | ICD-10-CM | POA: Diagnosis not present

## 2020-03-11 NOTE — Telephone Encounter (Signed)
Progressive degenerative changes of the RIGHT hip joint since 2017.  I have called her with MRI of the right hip report, progressive degenerative changes  She continues to have right hip pain, also radiating pain to right calf area, has to sit in the right position  I have put in an order for EMG nerve conduction study for evaluation of right lumbar radiculopathy before proceed with orthopedic referral

## 2020-03-11 NOTE — Telephone Encounter (Signed)
Progressive joint space narrowing of the RIGHT hip joint with subchondral cyst formation at the acetabular roof.  Minimal narrowing of LEFT hip joint Progressive degenerative changes of the RIGHT hip joint since 2017.

## 2020-03-13 ENCOUNTER — Ambulatory Visit (INDEPENDENT_AMBULATORY_CARE_PROVIDER_SITE_OTHER): Payer: PPO | Admitting: Neurology

## 2020-03-13 ENCOUNTER — Other Ambulatory Visit: Payer: Self-pay

## 2020-03-13 ENCOUNTER — Ambulatory Visit: Payer: PPO | Admitting: Neurology

## 2020-03-13 DIAGNOSIS — M25551 Pain in right hip: Secondary | ICD-10-CM | POA: Diagnosis not present

## 2020-03-13 DIAGNOSIS — M5416 Radiculopathy, lumbar region: Secondary | ICD-10-CM

## 2020-03-13 DIAGNOSIS — G35 Multiple sclerosis: Secondary | ICD-10-CM

## 2020-03-13 MED ORDER — TRAMADOL HCL 50 MG PO TABS
50.0000 mg | ORAL_TABLET | Freq: Two times a day (BID) | ORAL | 5 refills | Status: DC | PRN
Start: 2020-03-13 — End: 2020-08-13

## 2020-03-13 NOTE — Progress Notes (Signed)
HISTORY OF PRESENT ILLNESS: Andrea Cantrell is a 70 year old female with a history of multiple sclerosis.  She returns today for follow-up.  She remains on Tecfidera.  She states that she has noticed more numbness and tingling in the left buttocks.  She states that if she touches this area she has discomfort.  In the past she has been diagnosed with spinal stenosis but has not followed up with that physician.  She is unsure of his name.  She denies any significant changes with her gait or balance.  No changes with the bowels or bladder.  Her last MRI of the brain was in 2019.  She remains on tramadol for pain.  HISTORY 04/05/19:  Andrea Cantrell is a 70 year old female with a history of multiple sclerosis.  She returns today for follow-up.  She remains on Tecfidera.  She denies any new numbness and tingling.  Reports that she has ongoing numbness in the left arm that comes intermittently.  Denies any changes with the bowels or bladder.  No significant changes with her gait or balance.  She did see an orthopedic in December who gave her injections in the knee.  She reports that this has been beneficial for her discomfort in the knees.  She continues to have back pain (severe spinal stenosis).  Denies any trouble with her vision.  She continues to take tramadol 50 to 100 mg a day.  She states that typically she takes 2 tablets every day.  She states that this has been beneficial for her ongoing discomfort.  In the past she has been at a pain clinic in [place].  She returns today for an evaluation.  Updated February 26, 2020: I personally reviewed MRI of the brain with and without contrast November 19, 2019: Multiple MS lesions, no contrast-enhancement, no change compared to previous scan on March 18, 2017,  Today she complains worsening right low back pain, radiating pain to right lower extremity, was seen by orthopedic surgeon Dr. Gladstone Lighter in 2019, I personally reviewed CT myelogram in March 2019, evidence  of severe L4-5 facet arthrosis, severe spinal stenosis, right great than left foraminal stenosis, multilevel spinal stenosis, moderate to severe left foraminal stenosis at L3-4, moderate neuroforaminal stenosis L5-S1  She lost to follow-up with orthopedic surgeon Dr. Gladstone Lighter due to family issues in 2019,  Since October 2021, she complains worst radiating pain from the right lower back to left lower extremity  UPDATE Mar 13 2020: She return for electrodiagnostic study today, which showed no significant abnormality  She complains of worsening right-sided low back pain, radiating pain to right hip, dragging her right leg early morning times, pain can go up to 9 out of 10  We personally reviewed MRI of lumbar January 2022: Multilevel degenerative changes, most severe L3-4, L4-5, mild canal, severe bilateral foraminal stenosis, likely encroachment on the exiting nerve roots, also moderate bilateral foraminal narrowing at 5x1, compared to MRI scan in 2006, this degenerative change appear to have progressed  X-ray of the right hip showed progressive right hip degenerative changes since 2017  REVIEW OF SYSTEMS: Out of a complete 14 system review of symptoms, the patient complains only of the following symptoms, and all other reviewed systems are negative.  See HPI  ALLERGIES: No Known Allergies  HOME MEDICATIONS: Outpatient Medications Prior to Visit  Medication Sig Dispense Refill  . albuterol (PROVENTIL HFA;VENTOLIN HFA) 108 (90 BASE) MCG/ACT inhaler Inhale 2 puffs into the lungs every 6 (six) hours as needed for wheezing or  shortness of breath (wheezing and shortness of breath). For shortness of breath.    Marland Kitchen amLODipine (NORVASC) 5 MG tablet Take 5 mg by mouth every morning.     Marland Kitchen aspirin EC 81 MG tablet Take 81 mg by mouth every evening.     Marland Kitchen atorvastatin (LIPITOR) 10 MG tablet Take 10 mg by mouth every morning.     . baclofen (LIORESAL) 10 MG tablet Take 1 tablet (10 mg total) by mouth 4  (four) times daily. 360 tablet 4  . Calcium Carbonate (CALCIUM 600 PO) Take 600 mg by mouth in the morning and at bedtime.    . cholecalciferol (VITAMIN D) 25 MCG (1000 UNIT) tablet Take 1,000 Units by mouth daily.    . diphenhydrAMINE-APAP, sleep, (TYLENOL PM EXTRA STRENGTH PO) Take 1 tablet by mouth at bedtime.    . Diroximel Fumarate (VUMERITY) 231 MG CPDR Take 462 mg by mouth 2 (two) times daily.    Marland Kitchen gabapentin (NEURONTIN) 400 MG capsule Take 1 capsule (400 mg total) by mouth 4 (four) times daily. 360 capsule 4  . ibuprofen (ADVIL,MOTRIN) 200 MG tablet Take 600 mg by mouth every 6 (six) hours as needed for moderate pain.    Marland Kitchen levalbuterol (XOPENEX HFA) 45 MCG/ACT inhaler Inhale 2 puffs into the lungs every 6 (six) hours.    . polyethylene glycol (MIRALAX / GLYCOLAX) packet Uses as needed for constipation at home.  He can buy this over-the-counter at any drugstore.  Follow package instructions. 14 each 0  . traMADol (ULTRAM) 50 MG tablet Take 1 tablet (50 mg total) by mouth 2 (two) times daily as needed. 60 tablet 0   No facility-administered medications prior to visit.    PAST MEDICAL HISTORY: Past Medical History:  Diagnosis Date  . Discoid lupus   . Essential hypertension   . Fibromyalgia   . Heavy smoker (more than 20 cigarettes per day)    Was able to stop for 11 years but restarted about 10 years ago  . Hyperlipidemia with target LDL less than 130   . MS (multiple sclerosis) ([place])   . Obesity (BMI 35.0-39.9 without comorbidity)   . Spinal stenosis     PAST SURGICAL HISTORY: Past Surgical History:  Procedure Laterality Date  . ABDOMINAL HYSTERECTOMY  1995  . BREAST SURGERY Left    tissue removal  . [place]  . CHOLECYSTECTOMY  1994  . INCISIONAL HERNIA REPAIR N/A 12/09/2016   Procedure: LAPAROSCOPIC INCISIONAL HERNIA REPAIR WITH MESH;  Surgeon: Excell Seltzer, MD;  Location: [facility];  Service: General;  Laterality: N/A;  . NM MYOVIEW LTD  October  2015   EF 71%. No ischemia or infarction. Low risk/normal  . ROTATOR CUFF REPAIR Left June 2016  . TRANSTHORACIC ECHOCARDIOGRAM  07/2016    (To evaluate murmur).  Normal LV size and function.  EF 60-65%.  No RWMA.  Normal diastolic function. No comment on valvular disease. -- NORMAL ECHO    FAMILY HISTORY: Family History  Problem Relation Age of Onset  . Cancer Mother   . Cancer Father   . Diabetes Brother   . Diabetes Maternal Grandmother     SOCIAL HISTORY: Social History   Socioeconomic History  . Marital status: Legally Separated    Spouse name: Not on file  . Number of children: 2  . Years of education: 12+  . Highest education level: Not on file  Occupational History  . Not on file  Tobacco Use  .  Smoking status: Current Every Day Smoker    Packs/day: 1.00    Types: Cigarettes  . Smokeless tobacco: Never Used  . Tobacco comment: now maybe 3/4 pack a day starting 02/25/20.  Vaping Use  . Vaping Use: Never used  Substance and Sexual Activity  . Alcohol use: No  . Drug use: No  . Sexual activity: Not on file  Other Topics Concern  . Not on file  Social History Narrative   Patient lives at home with her son.   Patient is separated. Patient has 2 children.    Patient has some college. Patient is on Disability.    Smokes roughly one pack a day. No alcohol.   Social Determinants of Health   Financial Resource Strain: Not on file  Food Insecurity: Not on file  Transportation Needs: Not on file  Physical Activity: Not on file  Stress: Not on file  Social Connections: Not on file  Intimate Partner Violence: Not on file      PHYSICAL EXAM  There were no vitals filed for this visit. There is no height or weight on file to calculate BMI.  PHYSICAL EXAMNIATION:  Gen: NAD, conversant, well nourised, well groomed                     Cardiovascular: Regular rate rhythm, no peripheral edema, warm, nontender. Eyes: Conjunctivae clear without exudates or  hemorrhage Neck: Supple, no carotid bruits. Pulmonary: Clear to auscultation bilaterally   NEUROLOGICAL EXAM:  MENTAL STATUS: Speech/Cognition: Awake, alert, normal speech, oriented to history taking and casual conversation.  CRANIAL NERVES: CN II: Visual fields are full to confrontation.  Pupils are round equal and briskly reactive to light. CN III, IV, VI: extraocular movement are normal. No ptosis. CN V: Facial sensation is intact to light touch. CN VII: Face is symmetric with normal eye closure and smile. CN VIII: Hearing is normal to casual conversation CN IX, X: Palate elevates symmetrically. Phonation is normal. CN XI: Head turning and shoulder shrug are intact CN XII: Tongue is midline with normal movements and no atrophy.  MOTOR: She has mild bilateral toe extension, flexion weakness,  REFLEXES: Reflexes are 2  and symmetric at the biceps, triceps, knees and absent at ankles. Plantar responses are flexor.  SENSORY: Intact to light touch, pinprick, positional and vibratory sensation at fingers and toes.  COORDINATION: There is no trunk or limb ataxia.    GAIT/STANCE: She needs a push-up to get up from seated position, mildly antalgic, steady   DIAGNOSTIC DATA (LABS, IMAGING, TESTING) - I reviewed patient records, labs, notes, testing and imaging myself where available.  Lab Results  Component Value Date   WBC 5.0 10/11/2019   HGB 14.4 10/11/2019   HCT 42.6 10/11/2019   MCV 89 10/11/2019   PLT 412 10/11/2019      Component Value Date/Time   NA 143 10/11/2019 1155   K 4.2 10/11/2019 1155   CL 104 10/11/2019 1155   CO2 26 10/11/2019 1155   GLUCOSE 87 10/11/2019 1155   GLUCOSE 91 12/11/2016 0507   BUN 14 10/11/2019 1155   CREATININE 0.63 10/11/2019 1155   CALCIUM 9.5 10/11/2019 1155   PROT 6.7 10/11/2019 1155   ALBUMIN 4.5 10/11/2019 1155   AST 17 10/11/2019 1155   ALT 14 10/11/2019 1155   ALKPHOS 107 10/11/2019 1155   BILITOT 0.6 10/11/2019 1155    GFRNONAA 91 10/11/2019 1155   GFRAA 105 10/11/2019 1155   No results found  for: CHOL, HDL, LDLCALC, LDLDIRECT, TRIG, CHOLHDL No results found for: HGBA1C No results found for: VITAMINB12 Lab Results  Component Value Date   TSH 1.200 06/30/2018      ASSESSMENT AND PLAN 70 y.o. year old female   Relapsing remitting multiple sclerosis  Stable  Is on Vumerity 231 mg 2 tablets twice a day since November 06, 2019,  Lumbar radiculopathy, right hip pain,  MRI of lumbar spine showed worsening degenerative changes, most severe at L3-4, L4-5 level, severe bilateral foraminal stenosis with mild canal stenosis, moderate foraminal stenosis L5-S1  X-ray of right hip also show progressive degenerative changes  Will refer her to orthopedic, pain management  Continue tramadol 50 mg as needed for severe pain, gabapentin up to maximum 3600 mg daily, baclofen 10 mg 4 times a day  Marcial Pacas, M.D. Ph.D.  [facility] [place], [place] [address] Phone: [phone] Fax:      [phone]

## 2020-03-13 NOTE — Procedures (Signed)
Full Name: Andrea Cantrell Gender: Female MRN #: [id number] Date of Birth: 1950-02-24    Visit Date: 03/13/2020 08:58 Age: 70 Years Examining Physician: Marcial Pacas, MD  Referring Physician: Marcial Pacas, MD History: 70 year old female with history of chronic low back pain, presenting with worsening back pain, radiating pain to right hip  Summary of the test: Nerve conduction study: Right sural, superficial peroneal sensory responses were normal.  Right peroneal to EDB and tibial motor responses were normal.  Electromyography: Selected needle examination of the bilateral lower extremity muscles and bilateral lumbosacral paraspinal muscles were normal.  Conclusion: This is essentially a normal study.  There is no electrodiagnostic evidence of large fiber peripheral neuropathy, or active bilateral lumbosacral radiculopathy.    ------------------------------- Marcial Pacas, M.D. PhD  [facility] [address] [place], [place] [address] Tel: [phone] Fax: [phone]  Verbal informed consent was obtained from the patient, patient was informed of potential risk of procedure, including bruising, bleeding, hematoma formation, infection, muscle weakness, muscle pain, numbness, among others.        [place]    Nerve / Sites Muscle Latency Ref. Amplitude Ref. Rel Amp Segments Distance Velocity Ref. Area    ms ms mV mV %  cm m/s m/s mVms  R Peroneal - EDB     Ankle EDB 4.1 ?6.5 5.2 ?2.0 100 Ankle - EDB 9   12.7     Fib head EDB 9.3  4.2  80.1 Fib head - Ankle 25 48 ?44 11.6     Pop fossa EDB 11.4  4.4  105 Pop fossa - Fib head 10 48 ?44 12.1         Pop fossa - Ankle      R Tibial - AH     Ankle AH 4.0 ?5.8 6.8 ?4.0 100 Ankle - AH 9   11.0     Pop fossa AH 11.8  5.2  76.6 Pop fossa - Ankle 34 44 ?41 10.1         SNC    Nerve / Sites Rec. Site Peak Lat Ref.  Amp Ref. Segments Distance    ms ms V V  cm  R Sural - Ankle (Calf)     Calf Ankle 3.3 ?4.4  15 ?6 Calf - Ankle 14  R Superficial peroneal - Ankle     Lat leg Ankle 3.3 ?4.4 11 ?6 Lat leg - Ankle 14         F  Wave    Nerve F Lat Ref.   ms ms  R Tibial - AH 45.9 ?56.0       H Reflex    Nerve H Lat   ms   Left Right Ref.  Tibial - [place] 32.3 32.3 ?35.0         EMG Summary Table    Spontaneous MUAP Recruitment  Muscle IA Fib PSW Fasc Other Amp Dur. Poly Pattern  R. Tibialis anterior Normal None None None _______ Normal Normal Normal Normal  R. Tibialis posterior Normal None None None _______ Normal Normal Normal Normal  R. Peroneus longus Normal None None None _______ Normal Normal Normal Normal  R. Gastrocnemius (Medial head) Normal None None None _______ Normal Normal Normal Normal  R. Vastus lateralis Normal None None None _______ Normal Normal Normal Normal  L. Tibialis anterior Normal None None None _______ Normal Normal Normal Normal  L. Tibialis posterior Normal None None None _______ Normal Normal Normal Normal  L. Peroneus longus Normal None None None _______ Normal Normal Normal Normal  L. Gastrocnemius (Medial head) Normal None None None _______ Normal Normal Normal Normal  L. Vastus lateralis Normal None None None _______ Normal Normal Normal Normal  R. Lumbar paraspinals (low) Normal None None None _______ Normal Normal Normal Normal  R. Lumbar paraspinals (mid) Normal None None None _______ Normal Normal Normal Normal  L. Lumbar paraspinals (low) Normal None None None _______ Normal Normal Normal Normal  L. Lumbar paraspinals (mid) Normal None None None _______ Normal Normal Normal Normal

## 2020-03-26 DIAGNOSIS — M1611 Unilateral primary osteoarthritis, right hip: Secondary | ICD-10-CM | POA: Diagnosis not present

## 2020-03-26 DIAGNOSIS — M5416 Radiculopathy, lumbar region: Secondary | ICD-10-CM | POA: Diagnosis not present

## 2020-04-02 DIAGNOSIS — D508 Other iron deficiency anemias: Secondary | ICD-10-CM | POA: Diagnosis not present

## 2020-04-02 DIAGNOSIS — I10 Essential (primary) hypertension: Secondary | ICD-10-CM | POA: Diagnosis not present

## 2020-04-02 DIAGNOSIS — M1712 Unilateral primary osteoarthritis, left knee: Secondary | ICD-10-CM | POA: Diagnosis not present

## 2020-04-02 DIAGNOSIS — M25561 Pain in right knee: Secondary | ICD-10-CM | POA: Diagnosis not present

## 2020-04-02 DIAGNOSIS — M25562 Pain in left knee: Secondary | ICD-10-CM | POA: Diagnosis not present

## 2020-04-02 DIAGNOSIS — F4321 Adjustment disorder with depressed mood: Secondary | ICD-10-CM | POA: Diagnosis not present

## 2020-04-02 DIAGNOSIS — F325 Major depressive disorder, single episode, in full remission: Secondary | ICD-10-CM | POA: Diagnosis not present

## 2020-04-02 DIAGNOSIS — E785 Hyperlipidemia, unspecified: Secondary | ICD-10-CM | POA: Diagnosis not present

## 2020-04-02 DIAGNOSIS — M25551 Pain in right hip: Secondary | ICD-10-CM | POA: Diagnosis not present

## 2020-04-10 ENCOUNTER — Ambulatory Visit: Payer: PPO | Admitting: Neurology

## 2020-04-10 DIAGNOSIS — M5416 Radiculopathy, lumbar region: Secondary | ICD-10-CM | POA: Diagnosis not present

## 2020-04-11 ENCOUNTER — Ambulatory Visit: Payer: PPO | Admitting: Neurology

## 2020-05-01 DIAGNOSIS — M25551 Pain in right hip: Secondary | ICD-10-CM | POA: Diagnosis not present

## 2020-05-02 DIAGNOSIS — D508 Other iron deficiency anemias: Secondary | ICD-10-CM | POA: Diagnosis not present

## 2020-05-02 DIAGNOSIS — M1712 Unilateral primary osteoarthritis, left knee: Secondary | ICD-10-CM | POA: Diagnosis not present

## 2020-05-02 DIAGNOSIS — M1611 Unilateral primary osteoarthritis, right hip: Secondary | ICD-10-CM | POA: Diagnosis not present

## 2020-05-02 DIAGNOSIS — F325 Major depressive disorder, single episode, in full remission: Secondary | ICD-10-CM | POA: Diagnosis not present

## 2020-05-02 DIAGNOSIS — E785 Hyperlipidemia, unspecified: Secondary | ICD-10-CM | POA: Diagnosis not present

## 2020-05-02 DIAGNOSIS — F4321 Adjustment disorder with depressed mood: Secondary | ICD-10-CM | POA: Diagnosis not present

## 2020-05-02 DIAGNOSIS — I10 Essential (primary) hypertension: Secondary | ICD-10-CM | POA: Diagnosis not present

## 2020-05-15 DIAGNOSIS — I10 Essential (primary) hypertension: Secondary | ICD-10-CM | POA: Diagnosis not present

## 2020-05-15 DIAGNOSIS — I700 Atherosclerosis of aorta: Secondary | ICD-10-CM | POA: Diagnosis not present

## 2020-05-15 DIAGNOSIS — G35 Multiple sclerosis: Secondary | ICD-10-CM | POA: Diagnosis not present

## 2020-05-15 DIAGNOSIS — M5136 Other intervertebral disc degeneration, lumbar region: Secondary | ICD-10-CM | POA: Diagnosis not present

## 2020-05-15 DIAGNOSIS — M1611 Unilateral primary osteoarthritis, right hip: Secondary | ICD-10-CM | POA: Diagnosis not present

## 2020-05-15 DIAGNOSIS — F17200 Nicotine dependence, unspecified, uncomplicated: Secondary | ICD-10-CM | POA: Diagnosis not present

## 2020-05-15 DIAGNOSIS — E785 Hyperlipidemia, unspecified: Secondary | ICD-10-CM | POA: Diagnosis not present

## 2020-05-15 DIAGNOSIS — F325 Major depressive disorder, single episode, in full remission: Secondary | ICD-10-CM | POA: Diagnosis not present

## 2020-05-15 DIAGNOSIS — E559 Vitamin D deficiency, unspecified: Secondary | ICD-10-CM | POA: Diagnosis not present

## 2020-05-24 DIAGNOSIS — M25551 Pain in right hip: Secondary | ICD-10-CM | POA: Diagnosis not present

## 2020-06-17 DIAGNOSIS — M25561 Pain in right knee: Secondary | ICD-10-CM | POA: Diagnosis not present

## 2020-06-17 DIAGNOSIS — M1611 Unilateral primary osteoarthritis, right hip: Secondary | ICD-10-CM | POA: Diagnosis not present

## 2020-06-17 DIAGNOSIS — M25562 Pain in left knee: Secondary | ICD-10-CM | POA: Diagnosis not present

## 2020-06-17 DIAGNOSIS — M25551 Pain in right hip: Secondary | ICD-10-CM | POA: Diagnosis not present

## 2020-06-21 DIAGNOSIS — K0889 Other specified disorders of teeth and supporting structures: Secondary | ICD-10-CM | POA: Diagnosis not present

## 2020-06-25 DIAGNOSIS — E785 Hyperlipidemia, unspecified: Secondary | ICD-10-CM | POA: Diagnosis not present

## 2020-06-25 DIAGNOSIS — D508 Other iron deficiency anemias: Secondary | ICD-10-CM | POA: Diagnosis not present

## 2020-06-25 DIAGNOSIS — M1611 Unilateral primary osteoarthritis, right hip: Secondary | ICD-10-CM | POA: Diagnosis not present

## 2020-06-25 DIAGNOSIS — F4321 Adjustment disorder with depressed mood: Secondary | ICD-10-CM | POA: Diagnosis not present

## 2020-06-25 DIAGNOSIS — M1712 Unilateral primary osteoarthritis, left knee: Secondary | ICD-10-CM | POA: Diagnosis not present

## 2020-06-25 DIAGNOSIS — I10 Essential (primary) hypertension: Secondary | ICD-10-CM | POA: Diagnosis not present

## 2020-06-25 DIAGNOSIS — F325 Major depressive disorder, single episode, in full remission: Secondary | ICD-10-CM | POA: Diagnosis not present

## 2020-06-27 ENCOUNTER — Telehealth: Payer: Self-pay | Admitting: Cardiology

## 2020-06-27 NOTE — Telephone Encounter (Signed)
Attempted to call patient, left message for patient to call back to office.   

## 2020-06-27 NOTE — Telephone Encounter (Signed)
Patient c/o Palpitations:  High priority if patient c/o lightheadedness, shortness of breath, or chest pain  1) How long have you had palpitations/irregular HR/ Afib? Are you having the symptoms now? No more at night  2) Are you currently experiencing lightheadedness, SOB or CP? No  3) Do you have a history of afib (atrial fibrillation) or irregular heart rhythm? No not that pt is aware of  4) Have you checked your BP or HR? (document readings if available): No  5) Are you experiencing any other symptoms? Pt started 2 new meds for a toothache Friday 06/21/20 Amoxicillin and Oxycodone, pt feels like since she takes so many other meds, it might have affected her HR.

## 2020-07-02 ENCOUNTER — Other Ambulatory Visit: Payer: Self-pay | Admitting: Orthopedic Surgery

## 2020-07-02 DIAGNOSIS — Z01811 Encounter for preprocedural respiratory examination: Secondary | ICD-10-CM

## 2020-07-03 NOTE — Telephone Encounter (Signed)
Called patient who reports that her palpitations have resolved and she is feeling better. Reports she is having hip replacement in June or July and will need cardiac clearance. I advised that she should contact Dr. Berenice Primas' office so that clearance can be sent to our office and I explained our pre-op process to her. I verified the office fax number for her as well. She verbalized understanding and thanked me for the call.

## 2020-07-18 ENCOUNTER — Telehealth: Payer: Self-pay

## 2020-07-18 ENCOUNTER — Telehealth: Payer: Self-pay | Admitting: *Deleted

## 2020-07-18 NOTE — Telephone Encounter (Signed)
   [facility] Pre-operative Risk Assessment    Patient Name: Andrea Cantrell  DOB: [birth year]  MRN: [id number]   HEARTCARE STAFF: - Please ensure there is not already an duplicate clearance open for this procedure. - Under Visit Info/Reason for Call, type in Other and utilize the format Clearance MM/DD/YY or Clearance TBD. Do not use dashes or single digits. - If request is for dental extraction, please clarify the # of teeth to be extracted. - If the patient is currently at the dentist's office, call Pre-Op APP to address. If the patient is not currently in the dentist office, please route to the Pre-Op pool  Request for surgical clearance:  1. What type of surgery is being performed? Right Total Hip Arthroplasty  2. When is this surgery scheduled? 08/09/20   3. What type of clearance is required (medical clearance vs. Pharmacy clearance to hold med vs. Both)? BOTH   4. Are there any medications that need to be held prior to surgery and how long? Aspirin   5. Practice name and name of physician performing surgery? Dr. Dorna Leitz    6. What is the office phone number? [phone]   7.   What is the office fax number? [phone] ATTN: Marcelo Baldy  8.   Anesthesia type (None, local, MAC, general) ? Spinal    [name] B [name] 07/18/2020, 6:38 PM  _________________________________________________________________   (provider comments below)

## 2020-07-18 NOTE — Telephone Encounter (Signed)
[facility] faxed over a surgery clearance form for a right total hip arthroplasty. Dr. Krista Blue reviewed and completed. Medium risk. Stop aspirin 81mg  five days prior to surgery. Restart when surgeon feels she is free of risk. Faxed and confirmed back their office at [phone] (attention: Marcelo Baldy).

## 2020-07-19 NOTE — Telephone Encounter (Signed)
   Name: Andrea Cantrell  DOB: [birth year]  MRN: [id number]   Primary Cardiologist: Glenetta Hew, MD  Chart reviewed as part of pre-operative protocol coverage. Patient was contacted 07/19/2020 in reference to pre-operative risk assessment for pending surgery as outlined below.  Andrea Cantrell was last seen on 12/14/19 by Dr. Ellyn Hack. She has a hx of HTN, HLD, palpitations.  Since that day, Andrea Cantrell has done well. She tells me her palpitations are well controlled. Reports no chest pain. Exercise has been limited by hip pain but she is able to complete >4 METS.   Therefore, based on ACC/AHA guidelines, the patient would be at acceptable risk for the planned procedure without further cardiovascular testing.   Given lack of history of ASCVD, she may hold Aspirin if directed by surgeon.  The patient was advised that if she develops new symptoms prior to surgery to contact our office to arrange for a follow-up visit, and she verbalized understanding.  I will route this recommendation to the requesting party via Epic fax function and remove from pre-op pool. Please call with questions.  Loel Dubonnet, NP 07/19/2020, 8:47 AM

## 2020-07-25 DIAGNOSIS — E785 Hyperlipidemia, unspecified: Secondary | ICD-10-CM | POA: Diagnosis not present

## 2020-07-25 DIAGNOSIS — D508 Other iron deficiency anemias: Secondary | ICD-10-CM | POA: Diagnosis not present

## 2020-07-25 DIAGNOSIS — M1611 Unilateral primary osteoarthritis, right hip: Secondary | ICD-10-CM | POA: Diagnosis not present

## 2020-07-25 DIAGNOSIS — F4321 Adjustment disorder with depressed mood: Secondary | ICD-10-CM | POA: Diagnosis not present

## 2020-07-25 DIAGNOSIS — F325 Major depressive disorder, single episode, in full remission: Secondary | ICD-10-CM | POA: Diagnosis not present

## 2020-07-25 DIAGNOSIS — I10 Essential (primary) hypertension: Secondary | ICD-10-CM | POA: Diagnosis not present

## 2020-07-25 DIAGNOSIS — M1712 Unilateral primary osteoarthritis, left knee: Secondary | ICD-10-CM | POA: Diagnosis not present

## 2020-07-29 NOTE — Care Plan (Signed)
Ortho Bundle Case Management Note  Patient Details  Name: Andrea Cantrell MRN: [id number] Date of Birth: November 03, 1950  Spoke with patient prior to surgery. She will discharge to home with family to assist. Rolling walker ordered for home use. She has 1 flight of stairs to get into her home. Will need an overnight stay for therapy and pain control. OPPT set up with [facility].  Patient and MD in agreement with plan. Choice offered.              DME Arranged:  [place] Rile rolling DME Agency:  [employer]  HH Arranged:    [place] Agency:     Additional Comments: Please contact me with any questions of if this plan should need to change.  Ladell Heads,  [place] Specialist  [phone] 07/29/2020, 4:55 PM

## 2020-08-01 NOTE — Patient Instructions (Signed)
DUE TO COVID-19 ONLY ONE VISITOR IS ALLOWED TO COME WITH YOU AND STAY IN THE WAITING ROOM ONLY DURING PRE OP AND PROCEDURE DAY OF SURGERY. THE 1 VISITOR  MAY VISIT WITH YOU AFTER SURGERY IN YOUR PRIVATE ROOM DURING VISITING HOURS ONLY!                Andrea Cantrell   Your procedure is scheduled on: 08/09/20   Report to [facility] Main  Entrance   Report to admitting at : 10:00 AM     Call this number if you have problems the morning of surgery [phone]    Remember: NO SOLID FOOD AFTER MIDNIGHT THE NIGHT PRIOR TO SURGERY. NOTHING BY MOUTH EXCEPT CLEAR LIQUIDS UNTIL : 9:30 AM. PLEASE FINISH ENSURE DRINK PER SURGEON ORDER  WHICH NEEDS TO BE COMPLETED AT: 9:30 AM.  CLEAR LIQUID DIET  Foods Allowed                                                                     Foods Excluded  Coffee and tea, regular and decaf                             liquids that you cannot  Plain Jell-O any favor except red or purple                                           see through such as: Fruit ices (not with fruit pulp)                                     milk, soups, orange juice  Iced Popsicles                                    All solid food Carbonated beverages, regular and diet                                    Cranberry, grape and apple juices Sports drinks like Gatorade Lightly seasoned clear broth or consume(fat free) Sugar, honey syrup  Sample Menu Breakfast                                Lunch                                     Supper Cranberry juice                    Beef broth                            Chicken broth Jell-O  Grape juice                           Apple juice Coffee or tea                        Jell-O                                      Popsicle                                                Coffee or tea                        Coffee or tea  _____________________________________________________________________    BRUSH YOUR TEETH MORNING OF SURGERY AND RINSE YOUR MOUTH OUT, NO CHEWING GUM CANDY OR MINTS.    Take these medicines the morning of surgery with A SIP OF WATER: gabapentin,valacyclovir,amlodipine.Loratadine as needed.Inhalers an usual.                             You may not have any metal on your body including hair pins and              piercings  Do not wear jewelry, make-up, lotions, powders or perfumes, deodorant.    Do not bring valuables to the hospital. [place].  Contacts, dentures or bridgework may not be worn into surgery.  Leave suitcase in the car. After surgery it may be brought to your room.     Patients discharged the day of surgery will not be allowed to drive home. IF YOU ARE HAVING SURGERY AND GOING HOME THE SAME DAY, YOU MUST HAVE AN ADULT TO DRIVE YOU HOME AND BE WITH YOU FOR 24 HOURS. YOU MAY GO HOME BY TAXI OR UBER OR ORTHERWISE, BUT AN ADULT MUST ACCOMPANY YOU HOME AND STAY WITH YOU FOR 24 HOURS.  Name and phone number of your driver:  Special Instructions: N/A              Please read over the following fact sheets you were given: _____________________________________________________________________           [facility] - Preparing for Surgery Before surgery, you can play an important role.  Because skin is not sterile, your skin needs to be as free of germs as possible.  You can reduce the number of germs on your skin by washing with CHG (chlorahexidine gluconate) soap before surgery.  CHG is an antiseptic cleaner which kills germs and bonds with the skin to continue killing germs even after washing. Please DO NOT use if you have an allergy to CHG or antibacterial soaps.  If your skin becomes reddened/irritated stop using the CHG and inform your nurse when you arrive at Short Stay. Do not shave (including legs and underarms) for at least 48 hours prior to the first CHG shower.  You may shave your face/neck. Please  follow these instructions carefully:  1.  Shower with CHG Soap the night before surgery and the  morning of Surgery.  2.  If you choose to wash your hair, wash your hair first as usual with your  normal  shampoo.  3.  After you shampoo, rinse your hair and body thoroughly to remove the  shampoo.                           4.  Use CHG as you would any other liquid soap.  You can apply chg directly  to the skin and wash                       Gently with a scrungie or clean washcloth.  5.  Apply the CHG Soap to your body ONLY FROM THE NECK DOWN.   Do not use on face/ open                           Wound or open sores. Avoid contact with eyes, ears mouth and genitals (private parts).                       Wash face,  Genitals (private parts) with your normal soap.             6.  Wash thoroughly, paying special attention to the area where your surgery  will be performed.  7.  Thoroughly rinse your body with warm water from the neck down.  8.  DO NOT shower/wash with your normal soap after using and rinsing off  the CHG Soap.                9.  Pat yourself dry with a clean towel.            10.  Wear clean pajamas.            11.  Place clean sheets on your bed the night of your first shower and do not  sleep with pets. Day of Surgery : Do not apply any lotions/deodorants the morning of surgery.  Please wear clean clothes to the hospital/surgery center.  FAILURE TO FOLLOW THESE INSTRUCTIONS MAY RESULT IN THE CANCELLATION OF YOUR SURGERY PATIENT SIGNATURE_________________________________  NURSE SIGNATURE__________________________________  ________________________________________________________________________   [name]  An incentive spirometer is a tool that can help keep your lungs clear and active. This tool measures how well you are filling your lungs with each breath. Taking long deep breaths may help reverse or decrease the chance of developing breathing (pulmonary) problems  (especially infection) following: A long period of time when you are unable to move or be active. BEFORE THE PROCEDURE  If the spirometer includes an indicator to show your best effort, your nurse or respiratory therapist will set it to a desired goal. If possible, sit up straight or lean slightly forward. Try not to slouch. Hold the incentive spirometer in an upright position. INSTRUCTIONS FOR USE  Sit on the edge of your bed if possible, or sit up as far as you can in bed or on a chair. Hold the incentive spirometer in an upright position. Breathe out normally. Place the mouthpiece in your mouth and seal your lips tightly around it. Breathe in slowly and as deeply as possible, raising the piston or the ball toward the top of the column. Hold your breath for 3-5 seconds or for as long as possible. Allow the piston or ball to fall to the bottom  of the column. Remove the mouthpiece from your mouth and breathe out normally. Rest for a few seconds and repeat Steps 1 through 7 at least 10 times every 1-2 hours when you are awake. Take your time and take a few normal breaths between deep breaths. The spirometer may include an indicator to show your best effort. Use the indicator as a goal to work toward during each repetition. After each set of 10 deep breaths, practice coughing to be sure your lungs are clear. If you have an incision (the cut made at the time of surgery), support your incision when coughing by placing a pillow or rolled up towels firmly against it. Once you are able to get out of bed, walk around indoors and cough well. You may stop using the incentive spirometer when instructed by your caregiver.  RISKS AND COMPLICATIONS Take your time so you do not get dizzy or light-headed. If you are in pain, you may need to take or ask for pain medication before doing incentive spirometry. It is harder to take a deep breath if you are having pain. AFTER USE Rest and breathe slowly and  easily. It can be helpful to keep track of a log of your progress. Your caregiver can provide you with a simple table to help with this. If you are using the spirometer at home, follow these instructions: [place] IF:  You are having difficultly using the spirometer. You have trouble using the spirometer as often as instructed. Your pain medication is not giving enough relief while using the spirometer. You develop fever of 100.5 F (38.1 C) or higher. SEEK IMMEDIATE MEDICAL CARE IF:  You cough up bloody sputum that had not been present before. You develop fever of 102 F (38.9 C) or greater. You develop worsening pain at or near the incision site. MAKE SURE YOU:  Understand these instructions. Will watch your condition. Will get help right away if you are not doing well or get worse. Document Released: 06/15/2006 Document Revised: 04/27/2011 Document Reviewed: 08/16/2006 [facility] Patient Information 2014 [place], [place].   ________________________________________________________________________

## 2020-08-02 ENCOUNTER — Encounter (HOSPITAL_COMMUNITY)
Admission: RE | Admit: 2020-08-02 | Discharge: 2020-08-02 | Disposition: A | Discharge status: Home / Self Care | Payer: PPO | Source: Ambulatory Visit | Attending: Orthopedic Surgery | Admitting: Orthopedic Surgery

## 2020-08-02 ENCOUNTER — Encounter (HOSPITAL_COMMUNITY): Payer: Self-pay

## 2020-08-02 ENCOUNTER — Other Ambulatory Visit: Payer: Self-pay

## 2020-08-02 ENCOUNTER — Ambulatory Visit (HOSPITAL_COMMUNITY)
Admission: RE | Admit: 2020-08-02 | Discharge: 2020-08-02 | Disposition: A | Discharge status: Home / Self Care | Payer: PPO | Source: Ambulatory Visit | Attending: Orthopedic Surgery | Admitting: Orthopedic Surgery

## 2020-08-02 DIAGNOSIS — G35 Multiple sclerosis: Secondary | ICD-10-CM | POA: Insufficient documentation

## 2020-08-02 DIAGNOSIS — F17210 Nicotine dependence, cigarettes, uncomplicated: Secondary | ICD-10-CM | POA: Insufficient documentation

## 2020-08-02 DIAGNOSIS — Z01811 Encounter for preprocedural respiratory examination: Secondary | ICD-10-CM

## 2020-08-02 DIAGNOSIS — M1611 Unilateral primary osteoarthritis, right hip: Secondary | ICD-10-CM | POA: Insufficient documentation

## 2020-08-02 DIAGNOSIS — Z79899 Other long term (current) drug therapy: Secondary | ICD-10-CM | POA: Diagnosis not present

## 2020-08-02 DIAGNOSIS — Z7982 Long term (current) use of aspirin: Secondary | ICD-10-CM | POA: Diagnosis not present

## 2020-08-02 DIAGNOSIS — Z01818 Encounter for other preprocedural examination: Secondary | ICD-10-CM | POA: Diagnosis not present

## 2020-08-02 DIAGNOSIS — I10 Essential (primary) hypertension: Secondary | ICD-10-CM | POA: Diagnosis not present

## 2020-08-02 HISTORY — DX: Palpitations: R00.2

## 2020-08-02 HISTORY — DX: Pneumonia, unspecified organism: J18.9

## 2020-08-02 HISTORY — DX: Cardiac murmur, unspecified: R01.1

## 2020-08-02 HISTORY — DX: Unspecified osteoarthritis, unspecified site: M19.90

## 2020-08-02 LAB — CBC WITH DIFFERENTIAL/PLATELET
Abs Immature Granulocytes: 0.01 10*3/uL (ref 0.00–0.07)
Basophils Absolute: 0 10*3/uL (ref 0.0–0.1)
Basophils Relative: 0 %
Eosinophils Absolute: 0 10*3/uL (ref 0.0–0.5)
Eosinophils Relative: 1 %
HCT: 46.2 % — ABNORMAL HIGH (ref 36.0–46.0)
Hemoglobin: 15 g/dL (ref 12.0–15.0)
Immature Granulocytes: 0 %
Lymphocytes Relative: 11 %
Lymphs Abs: 0.5 10*3/uL — ABNORMAL LOW (ref 0.7–4.0)
MCH: 29.4 pg (ref 26.0–34.0)
MCHC: 32.5 g/dL (ref 30.0–36.0)
MCV: 90.4 fL (ref 80.0–100.0)
Monocytes Absolute: 0.5 10*3/uL (ref 0.1–1.0)
Monocytes Relative: 11 %
Neutro Abs: 3.6 10*3/uL (ref 1.7–7.7)
Neutrophils Relative %: 77 %
Platelets: 407 10*3/uL — ABNORMAL HIGH (ref 150–400)
RBC: 5.11 MIL/uL (ref 3.87–5.11)
RDW: 13.8 % (ref 11.5–15.5)
WBC: 4.7 10*3/uL (ref 4.0–10.5)
nRBC: 0 % (ref 0.0–0.2)

## 2020-08-02 LAB — COMPREHENSIVE METABOLIC PANEL
ALT: 16 U/L (ref 0–44)
AST: 22 U/L (ref 15–41)
Albumin: 4.4 g/dL (ref 3.5–5.0)
Alkaline Phosphatase: 93 U/L (ref 38–126)
Anion gap: 8 (ref 5–15)
BUN: 16 mg/dL (ref 8–23)
CO2: 27 mmol/L (ref 22–32)
Calcium: 9.3 mg/dL (ref 8.9–10.3)
Chloride: 103 mmol/L (ref 98–111)
Creatinine, Ser: 0.71 mg/dL (ref 0.44–1.00)
GFR, Estimated: >60 mL/min (ref >60)
Glucose, Bld: 87 mg/dL (ref 70–99)
Potassium: 4.3 mmol/L (ref 3.5–5.1)
Sodium: 138 mmol/L (ref 135–145)
Total Bilirubin: 0.7 mg/dL (ref 0.3–1.2)
Total Protein: 8 g/dL (ref 6.5–8.1)

## 2020-08-02 LAB — SURGICAL PCR SCREEN
MRSA, PCR: NEGATIVE
Staphylococcus aureus: NEGATIVE

## 2020-08-02 LAB — URINALYSIS, ROUTINE W REFLEX MICROSCOPIC
Bilirubin Urine: NEGATIVE
Glucose, UA: NEGATIVE mg/dL
Hgb urine dipstick: NEGATIVE
Ketones, ur: 20 mg/dL — AB
Leukocytes,Ua: NEGATIVE
Nitrite: NEGATIVE
Protein, ur: NEGATIVE mg/dL
Specific Gravity, Urine: 1.012 (ref 1.005–1.030)
pH: 5 (ref 5.0–8.0)

## 2020-08-02 LAB — PROTIME-INR
INR: 1 (ref 0.8–1.2)
Prothrombin Time: 12.9 seconds (ref 11.4–15.2)

## 2020-08-02 LAB — APTT: aPTT: 38 seconds — ABNORMAL HIGH (ref 24–36)

## 2020-08-02 NOTE — Progress Notes (Signed)
COVID Vaccine Completed: Yes Date COVID Vaccine completed: 12/2019 [place] COVID vaccine manufacturer: Pfizer     PCP - Dr. Harlan Stains Cardiologist - Dr. Nani Skillern. LOV: 12/14/19. Clearance: Caithin Walker: NP. 07/18/20: EPIC  Chest x-ray -  EKG - 12/14/19. EPIC Stress Test -  ECHO - 07/29/16 Cardiac Cath -  Pacemaker/ICD device last checked:  Sleep Study -  CPAP -   Fasting Blood Sugar -  Checks Blood Sugar _____ times a day  Blood Thinner Instructions: Aspirin Instructions: Last Dose:  Anesthesia review: Hx: HTN,Heart murmur,palpitations  Patient denies shortness of breath, fever, cough and chest pain at PAT appointment   Patient verbalized understanding of instructions that were given to them at the PAT appointment. Patient was also instructed that they will need to review over the PAT instructions again at home before surgery.

## 2020-08-02 NOTE — Progress Notes (Signed)
Lab. Result: UA: Ketones: 20 PTT: 38

## 2020-08-05 NOTE — Anesthesia Preprocedure Evaluation (Addendum)
Anesthesia Evaluation  Patient identified by MRN, date of birth, ID band Patient awake    Reviewed: Allergy & Precautions, NPO status , Patient's Chart, lab work & pertinent test results  Airway Mallampati: III  TM Distance: >3 FB Neck ROM: Full    Dental  (+) Chipped,    Pulmonary Current Smoker and Patient abstained from smoking.,    Pulmonary exam normal        Cardiovascular hypertension, Pt. on medications Normal cardiovascular exam     Neuro/Psych  Neuromuscular disease negative psych ROS   GI/Hepatic negative GI ROS,   Endo/Other    Renal/GU      Musculoskeletal  (+) Arthritis , Fibromyalgia -  Abdominal   Peds  Hematology   Anesthesia Other Findings   Reproductive/Obstetrics                           Anesthesia Physical Anesthesia Plan  ASA: 2  Anesthesia Plan: Spinal   Post-op Pain Management:    Induction: Intravenous  PONV Risk Score and Plan: 2 and Ondansetron and Propofol infusion  Airway Management Planned: Natural Airway and Simple Face Mask  Additional Equipment: None  Intra-op Plan:   Post-operative Plan:   Informed Consent: I have reviewed the patients History and Physical, chart, labs and discussed the procedure including the risks, benefits and alternatives for the proposed anesthesia with the patient or authorized representative who has indicated his/her understanding and acceptance.     Dental advisory given  Plan Discussed with: CRNA  Anesthesia Plan Comments: (See PAT note 08/02/2020, Konrad Felix, PA-C  Lab Results      Component                Value               Date                      WBC                      4.7                 08/02/2020                HGB                      15.0                08/02/2020                HCT                      46.2 (H)            08/02/2020                MCV                      90.4                 08/02/2020                PLT                      407 (H)             08/02/2020  Echo: - Left ventricle: The cavity size was normal. Wall thickness was  normal. Systolic function was normal. The estimated ejection  fraction was in the range of 60% to 65%. Wall motion was normal;  there were no regional wall motion abnormalities. There was no  evidence of elevated ventricular filling pressure by Doppler  parameters.  - Left atrium: The atrium was at the upper limits of normal in  size. Volume/bsa, ES, (1-plane Simpson&'s, A2C): 32.1 ml/m^2.  - Tricuspid valve: There was trivial regurgitation. )      Anesthesia Quick Evaluation

## 2020-08-05 NOTE — Progress Notes (Signed)
Anesthesia Chart Review  Case: [id number] Date/Time: 08/09/20 1246   Procedure: TOTAL HIP ARTHROPLASTY ANTERIOR APPROACH (Right: Hip)   Anesthesia type: Spinal   Pre-op diagnosis: RIGHT HIP OSTEOARTHRITIS   Location: [facility] ROOM 08 / [facility]   Surgeons: Dorna Leitz, MD       DISCUSSION:70 y.o. every day smoker with h/o HTN, MS, right hip OA scheduled for above procedure 08/09/2020 with Dr. Dorna Leitz.   Per cardiology preoperative evaluation 07/19/20, "Chart reviewed as part of pre-operative protocol coverage. Patient was contacted 07/19/2020 in reference to pre-operative risk assessment for pending surgery as outlined below.  Marirose L Vallin was last seen on 12/14/19 by Dr. Ellyn Hack. She has a hx of HTN, HLD, palpitations.  Since that day, ANTONETTE HENDRICKS has done well. She tells me her palpitations are well controlled. Reports no chest pain. Exercise has been limited by hip pain but she is able to complete >4 METS.    Therefore, based on ACC/AHA guidelines, the patient would be at acceptable risk for the planned procedure without further cardiovascular testing. "  Pt last seen by neurology 03/13/2020. Per OV note MS stable.   Severe spinal stenosis at L3-4.  Per neuro note experiences some right foot drop in early mornings.   Anticipate pt can proceed with planned procedure barring acute status change.   VS: BP 113/69   Pulse 66   Temp 36.7 C   Ht 5\' 2"  (1.575 m)   Wt 81.2 kg   SpO2 100%   BMI 32.74 kg/m   PROVIDERS: Harlan Stains, MD is PCP   Glenetta Hew, MD is Cardiologist  LABS: Labs reviewed: Acceptable for surgery. (all labs ordered are listed, but only abnormal results are displayed)  Labs Reviewed  CBC WITH DIFFERENTIAL/PLATELET - Abnormal; Notable for the following components:      Result Value   HCT 46.2 (*)    Platelets 407 (*)    Lymphs Abs 0.5 (*)    All other components within normal limits  APTT - Abnormal; Notable for the following components:   aPTT 38 (*)     All other components within normal limits  URINALYSIS, ROUTINE W REFLEX MICROSCOPIC - Abnormal; Notable for the following components:   Ketones, ur 20 (*)    All other components within normal limits  SURGICAL PCR SCREEN  COMPREHENSIVE METABOLIC PANEL  PROTIME-INR  TYPE AND SCREEN     IMAGES:   EKG: 12/14/2019 Rate 70 bpm  Sinus rhythm with premature supraventricular complexes  CV: Echo 07/29/2016 Study Conclusions   - Left ventricle: The cavity size was normal. Wall thickness was    normal. Systolic function was normal. The estimated ejection    fraction was in the range of 60% to 65%. Wall motion was normal;    there were no regional wall motion abnormalities. There was no    evidence of elevated ventricular filling pressure by Doppler    parameters.  - Left atrium: The atrium was at the upper limits of normal in    size. Volume/bsa, ES, (1-plane Simpson&'s, A2C): 32.1 ml/m^2.  - Tricuspid valve: There was trivial regurgitation.   Stress Test 11/28/2013 Low risk stress test finding. Normal cardiac function with no evidence of decreased blood flow to explain chest pain, and no evidence to suggest priorheart attack.. Past Medical History:  Diagnosis Date   Arthritis    Discoid lupus    Essential hypertension    Fibromyalgia    Heart murmur    Heavy  smoker (more than 20 cigarettes per day)    Was able to stop for 11 years but restarted about 10 years ago   Hyperlipidemia with target LDL less than 130    MS (multiple sclerosis) (HCC)    Obesity (BMI 35.0-39.9 without comorbidity)    Palpitations    Pneumonia    Spinal stenosis     Past Surgical History:  Procedure Laterality Date   ABDOMINAL HYSTERECTOMY  1995   BREAST SURGERY Left    tissue removal   [place] N/A 12/09/2016   Procedure: LAPAROSCOPIC INCISIONAL HERNIA REPAIR WITH MESH;  Surgeon: Excell Seltzer, MD;  Location[facility];  Service: General;  Laterality: N/A;   NM MYOVIEW LTD  October 2015   EF 71%. No ischemia or infarction. Low risk/normal   ROTATOR CUFF REPAIR Left June 2016   TRANSTHORACIC ECHOCARDIOGRAM  07/2016    (To evaluate murmur).  Normal LV size and function.  EF 60-65%.  No RWMA.  Normal diastolic function. No comment on valvular disease. -- NORMAL ECHO    MEDICATIONS:  albuterol (PROVENTIL HFA;VENTOLIN HFA) 108 (90 BASE) MCG/ACT inhaler   amLODipine (NORVASC) 5 MG tablet   aspirin EC 81 MG tablet   atorvastatin (LIPITOR) 10 MG tablet   baclofen (LIORESAL) 10 MG tablet   Calcium Carbonate (CALCIUM 600 PO)   cholecalciferol (VITAMIN D) 25 MCG (1000 UNIT) tablet   diphenhydramine-acetaminophen (TYLENOL PM) 25-500 MG TABS tablet   Diroximel Fumarate (VUMERITY) 231 MG CPDR   gabapentin (NEURONTIN) 400 MG capsule   ibuprofen (ADVIL,MOTRIN) 200 MG tablet   loratadine (CLARITIN) 10 MG tablet   polyethylene glycol (MIRALAX / GLYCOLAX) packet   pseudoephedrine-guaifenesin (MUCINEX D) 60-600 MG 12 hr tablet   traMADol (ULTRAM) 50 MG tablet   valACYclovir (VALTREX) 500 MG tablet   No current facility-administered medications for this encounter.   Konrad Felix, PA-C [facility]Surgical Testing [phone]

## 2020-08-06 NOTE — Care Plan (Signed)
Ortho Bundle Case Management Note  Patient Details  Name: Andrea Cantrell MRN: [id number] Date of Birth: [birth year]    Patient was preoperatively set up with HHPT from [facility]. She will received 5 visits prior to starting OPPT.   Her discharging address will be: [place] [place], [place]               OPPT appointment has been moved to 08/22/20.   DME Arranged:  [facility] Rile rolling DME Agency:  [employer]  HH Arranged:  PT HH Agency:  [facility]  Additional Comments: Please contact me with any questions of if this plan should need to change.  Ladell Heads,  [facility]  [phone] 08/06/2020, 10:31 AM

## 2020-08-08 MED ORDER — BUPIVACAINE LIPOSOME 1.3 % IJ SUSP
10.0000 mL | INTRAMUSCULAR | Status: DC
  Filled 2020-08-08: qty 10

## 2020-08-08 NOTE — H&P (Signed)
TOTAL HIP ADMISSION H&P  Patient is admitted for right total hip arthroplasty.  Subjective:  Chief Complaint: right hip pain  HPI: Andrea Cantrell, 70 y.o. female, has a history of pain and functional disability in the right hip(s) due to arthritis and patient has failed non-surgical conservative treatments for greater than 12 weeks to include NSAID's and/or analgesics, corticosteriod injections, flexibility and strengthening excercises, weight reduction as appropriate, and activity modification.  Onset of symptoms was gradual starting 10 years ago with gradually worsening course since that time.The patient noted no past surgery on the right hip(s).  Patient currently rates pain in the right hip at 9 out of 10 with activity. Patient has night pain, worsening of pain with activity and weight bearing, trendelenberg gait, pain with passive range of motion, and joint swelling. Patient has evidence of subchondral cysts, subchondral sclerosis, periarticular osteophytes, joint subluxation, and joint space narrowing by imaging studies. This condition presents safety issues increasing the risk of falls. This patient has had  failure of all reasonable conservative care .  There is no current active infection.  Patient Active Problem List   Diagnosis Date Noted   Right hip pain 02/26/2020   Palpitations 12/24/2019   Low back pain 04/13/2017   Paresthesia 03/05/2017   Incarcerated incisional hernia 12/09/2016   Tobacco abuse counseling 74/94/4967   Aortic systolic murmur on examination 06/17/2016   Lumbar radiculopathy 04/17/2014   Dizziness 04/13/2014   Acute bronchitis 04/13/2014   Hypoxia 04/12/2014   Left-sided low back pain with left-sided sciatica 03/26/2014   DOE (dyspnea on exertion) 11/20/2013   Essential hypertension    Hyperlipidemia with target LDL less than 130    Obesity (BMI 35.0-39.9 without comorbidity) ([place])    Multiple sclerosis ([place]) 07/26/2012   Other nonspecific abnormal  result of function study of brain and central nervous system 07/26/2012   Disturbance of skin sensation 07/26/2012   Past Medical History:  Diagnosis Date   Arthritis    Discoid lupus    Essential hypertension    Fibromyalgia    Heart murmur    Heavy smoker (more than 20 cigarettes per day)    Was able to stop for 11 years but restarted about 10 years ago   Hyperlipidemia with target LDL less than 130    MS (multiple sclerosis) ([place])    Obesity (BMI 35.0-39.9 without comorbidity)    Palpitations    Pneumonia    Spinal stenosis     Past Surgical History:  Procedure Laterality Date   ABDOMINAL HYSTERECTOMY  1995   BREAST SURGERY Left    tissue removal   [place] N/A 12/09/2016   Procedure: LAPAROSCOPIC INCISIONAL HERNIA REPAIR WITH MESH;  Surgeon: Excell Seltzer, MD;  Location[facility];  Service: General;  Laterality: N/A;   NM MYOVIEW LTD  October 2015   EF 71%. No ischemia or infarction. Low risk/normal   ROTATOR CUFF REPAIR Left June 2016   TRANSTHORACIC ECHOCARDIOGRAM  07/2016    (To evaluate murmur).  Normal LV size and function.  EF 60-65%.  No RWMA.  Normal diastolic function. No comment on valvular disease. -- NORMAL ECHO    Current Facility-Administered Medications  Medication Dose Route Frequency Provider Last Rate Last Admin   [START ON 08/09/2020] bupivacaine liposome (EXPAREL) 1.3 % injection 133 mg  10 mL Other On Call to OR Dorna Leitz, MD       Current  Outpatient Medications  Medication Sig Dispense Refill Last Dose   albuterol (PROVENTIL HFA;VENTOLIN HFA) 108 (90 BASE) MCG/ACT inhaler Inhale 2 puffs into the lungs every 4 (four) hours as needed for wheezing or shortness of breath. For shortness of breath.      amLODipine (NORVASC) 5 MG tablet Take 5 mg by mouth every morning.       aspirin EC 81 MG tablet Take 81 mg by mouth every evening.       atorvastatin (LIPITOR) 10 MG tablet Take  10 mg by mouth every morning.       baclofen (LIORESAL) 10 MG tablet Take 1 tablet (10 mg total) by mouth 4 (four) times daily. 360 tablet 4    Calcium Carbonate (CALCIUM 600 PO) Take 600 mg by mouth in the morning and at bedtime.      cholecalciferol (VITAMIN D) 25 MCG (1000 UNIT) tablet Take 1,000 Units by mouth daily.      diphenhydramine-acetaminophen (TYLENOL PM) 25-500 MG TABS tablet Take 1 tablet by mouth at bedtime.      Diroximel Fumarate (VUMERITY) 231 MG CPDR Take 462 mg by mouth 2 (two) times daily.      gabapentin (NEURONTIN) 400 MG capsule Take 1 capsule (400 mg total) by mouth 4 (four) times daily. 360 capsule 4    ibuprofen (ADVIL,MOTRIN) 200 MG tablet Take 600 mg by mouth every 6 (six) hours as needed for moderate pain.      loratadine (CLARITIN) 10 MG tablet Take 10 mg by mouth daily as needed for allergies.      polyethylene glycol (MIRALAX / GLYCOLAX) packet Uses as needed for constipation at home.  He can buy this over-the-counter at any drugstore.  Follow package instructions. (Patient taking differently: Take 17 g by mouth daily as needed for moderate constipation.) 14 each 0    pseudoephedrine-guaifenesin (MUCINEX D) 60-600 MG 12 hr tablet Take 1 tablet by mouth 2 (two) times daily as needed for congestion.      traMADol (ULTRAM) 50 MG tablet Take 1 tablet (50 mg total) by mouth 2 (two) times daily as needed. (Patient taking differently: Take 50-100 mg by mouth 2 (two) times daily as needed for severe pain.) 60 tablet 5    valACYclovir (VALTREX) 500 MG tablet Take 500 mg by mouth daily.      No Known Allergies  Social History   Tobacco Use   Smoking status: Every Day    Packs/day: 1.00    Years: 17.00    Pack years: 17.00    Types: Cigarettes   Smokeless tobacco: Never   Tobacco comments:    now maybe 3/4 pack a day starting 02/25/20.  Substance Use Topics   Alcohol use: No    Family History  Problem Relation Age of Onset   Cancer Mother    Cancer Father     Diabetes Brother    Diabetes Maternal Grandmother      Review of Systems ROS: I have reviewed the patient's review of systems thoroughly and there are no positive responses as relates to the HPI.  Objective:  Physical Exam  Vital signs in last 24 hours:   Well-developed well-nourished patient in no acute distress. Alert and oriented x3 HEENT:within normal limits Cardiac: Regular rate and rhythm Pulmonary: Lungs clear to auscultation Abdomen: Soft and nontender.  Normal active bowel sounds  Musculoskeletal: (rright hip: Painful range of motion.  Limited range of motion.  No instability.  Severely limited internal rotation.  Neurovascular intact distally.  Labs: Recent Results (from the past 2160 hour(s))  Surgical pcr screen     Status: None   Collection Time: 08/02/20 10:55 AM   Specimen: Nasal Mucosa; Nasal Swab  Result Value Ref Range   MRSA, PCR NEGATIVE NEGATIVE   Staphylococcus aureus NEGATIVE NEGATIVE    Comment: (NOTE) The Xpert SA Assay (FDA approved for NASAL specimens in patients 50 years of age and older), is one component of a comprehensive surveillance program. It is not intended to diagnose infection nor to guide or monitor treatment. Performed at [facility], [place] [address], [place], [place] [address]   CBC WITH DIFFERENTIAL     Status: Abnormal   Collection Time: 08/02/20 10:55 AM  Result Value Ref Range   WBC 4.7 4.0 - 10.5 K/uL   RBC 5.11 3.87 - 5.11 MIL/uL   Hemoglobin 15.0 12.0 - 15.0 g/dL   HCT 46.2 (H) 36.0 - 46.0 %   MCV 90.4 80.0 - 100.0 fL   MCH 29.4 26.0 - 34.0 pg   MCHC 32.5 30.0 - 36.0 g/dL   RDW 13.8 11.5 - 15.5 %   Platelets 407 (H) 150 - 400 K/uL   nRBC 0.0 0.0 - 0.2 %   Neutrophils Relative % 77 %   Neutro Abs 3.6 1.7 - 7.7 K/uL   Lymphocytes Relative 11 %   Lymphs Abs 0.5 (L) 0.7 - 4.0 K/uL   Monocytes Relative 11 %   Monocytes Absolute 0.5 0.1 - 1.0 K/uL   Eosinophils Relative 1 %   Eosinophils Absolute 0.0  0.0 - 0.5 K/uL   Basophils Relative 0 %   Basophils Absolute 0.0 0.0 - 0.1 K/uL   Immature Granulocytes 0 %   Abs Immature Granulocytes 0.01 0.00 - 0.07 K/uL    Comment: Performed at [facility], [place] [address] [place], [place] [address]  Comprehensive metabolic panel     Status: None   Collection Time: 08/02/20 10:55 AM  Result Value Ref Range   Sodium 138 135 - 145 mmol/L   Potassium 4.3 3.5 - 5.1 mmol/L   Chloride 103 98 - 111 mmol/L   CO2 27 22 - 32 mmol/L   Glucose, Bld 87 70 - 99 mg/dL    Comment: Glucose reference range applies only to samples taken after fasting for at least 8 hours.   BUN 16 8 - 23 mg/dL   Creatinine, Ser 0.71 0.44 - 1.00 mg/dL   Calcium 9.3 8.9 - 10.3 mg/dL   Total Protein 8.0 6.5 - 8.1 g/dL   Albumin 4.4 3.5 - 5.0 g/dL   AST 22 15 - 41 U/L   ALT 16 0 - 44 U/L   Alkaline Phosphatase 93 38 - 126 U/L   Total Bilirubin 0.7 0.3 - 1.2 mg/dL   GFR, Estimated >60 >60 mL/min    Comment: (NOTE) Calculated using the CKD-EPI Creatinine Equation (2021)    Anion gap 8 5 - 15    Comment: Performed at [facility], [place] [address], [place], [place] [address]  Protime-INR     Status: None   Collection Time: 08/02/20 10:55 AM  Result Value Ref Range   Prothrombin Time 12.9 11.4 - 15.2 seconds   INR 1.0 0.8 - 1.2    Comment: (NOTE) INR goal varies based on device and disease states. Performed at [facility], [place] [address], [place], [place] [address]   APTT     Status: Abnormal   Collection Time:  08/02/20 10:55 AM  Result Value Ref Range   aPTT 38 (H) 24 - 36 seconds    Comment:        IF BASELINE aPTT IS ELEVATED, SUGGEST PATIENT RISK ASSESSMENT BE USED TO DETERMINE APPROPRIATE ANTICOAGULANT THERAPY. Performed at [facility], [place] [address], [place], [place] [address]   Urinalysis, Routine w reflex microscopic Urine, Clean Catch     Status: Abnormal   Collection Time: 08/02/20  10:55 AM  Result Value Ref Range   Color, Urine YELLOW YELLOW   APPearance CLEAR CLEAR   Specific Gravity, Urine 1.012 1.005 - 1.030   pH 5.0 5.0 - 8.0   Glucose, UA NEGATIVE NEGATIVE mg/dL   Hgb urine dipstick NEGATIVE NEGATIVE   Bilirubin Urine NEGATIVE NEGATIVE   Ketones, ur 20 (A) NEGATIVE mg/dL   Protein, ur NEGATIVE NEGATIVE mg/dL   Nitrite NEGATIVE NEGATIVE   Leukocytes,Ua NEGATIVE NEGATIVE    Comment: Performed at [place] [address], [place], [place] [address]  Type and screen Order type and screen if day of surgery is less than 15 days from draw of preadmission visit or order morning of surgery if day of surgery is greater than 6 days from preadmission visit.     Status: None   Collection Time: 08/02/20 10:55 AM  Result Value Ref Range   ABO/RH(D) O POS    Antibody Screen NEG    Sample Expiration 08/16/2020,2359    Extend sample reason      NO TRANSFUSIONS OR PREGNANCY IN THE PAST 3 MONTHS Performed at [facility], [place] [address], [place], [place] [address]      Estimated body mass index is 32.74 kg/m as calculated from the following:   Height as of 08/02/20: 5\' 2"  (1.575 m).   Weight as of 08/02/20: 81.2 kg.   Imaging Review Plain radiographs demonstrate severe degenerative joint disease of the right hip(s). The bone quality appears to be fair for age and reported activity level.      Assessment/Plan:  End stage arthritis, right hip(s)  The patient history, physical examination, clinical judgement of the provider and imaging studies are consistent with end stage degenerative joint disease of the right hip(s) and total hip arthroplasty is deemed medically necessary. The treatment options including medical management, injection therapy, arthroscopy and arthroplasty were discussed at length. The risks and benefits of total hip arthroplasty were presented and reviewed. The risks due to aseptic loosening, infection,  stiffness, dislocation/subluxation,  thromboembolic complications and other imponderables were discussed.  The patient acknowledged the explanation, agreed to proceed with the plan and consent was signed. Patient is being admitted for inpatient treatment for surgery, pain control, PT, OT, prophylactic antibiotics, VTE prophylaxis, progressive ambulation and ADL's and discharge planning.The patient is planning to be discharged home with home health services

## 2020-08-09 ENCOUNTER — Ambulatory Visit (HOSPITAL_COMMUNITY): Payer: PPO | Admitting: Certified Registered Nurse Anesthetist

## 2020-08-09 ENCOUNTER — Encounter (HOSPITAL_COMMUNITY): Payer: Self-pay | Admitting: Orthopedic Surgery

## 2020-08-09 ENCOUNTER — Ambulatory Visit (HOSPITAL_COMMUNITY): Payer: PPO

## 2020-08-09 ENCOUNTER — Encounter (HOSPITAL_COMMUNITY)
Admission: RE | Disposition: A | Discharge status: Rehabilitation Facility | Payer: Self-pay | Source: Home / Self Care | Attending: Orthopedic Surgery

## 2020-08-09 ENCOUNTER — Other Ambulatory Visit: Payer: Self-pay

## 2020-08-09 ENCOUNTER — Ambulatory Visit (HOSPITAL_COMMUNITY): Payer: PPO | Admitting: Physician Assistant

## 2020-08-09 ENCOUNTER — Inpatient Hospital Stay (HOSPITAL_COMMUNITY)
Admission: RE | Admit: 2020-08-09 | Discharge: 2020-08-13 | DRG: 470 | Disposition: A | Discharge status: Rehabilitation Facility | Payer: PPO | Attending: Orthopedic Surgery | Admitting: Orthopedic Surgery

## 2020-08-09 DIAGNOSIS — I358 Other nonrheumatic aortic valve disorders: Secondary | ICD-10-CM | POA: Diagnosis not present

## 2020-08-09 DIAGNOSIS — E669 Obesity, unspecified: Secondary | ICD-10-CM | POA: Diagnosis present

## 2020-08-09 DIAGNOSIS — G8929 Other chronic pain: Secondary | ICD-10-CM | POA: Diagnosis present

## 2020-08-09 DIAGNOSIS — Z79899 Other long term (current) drug therapy: Secondary | ICD-10-CM

## 2020-08-09 DIAGNOSIS — R41841 Cognitive communication deficit: Secondary | ICD-10-CM | POA: Diagnosis not present

## 2020-08-09 DIAGNOSIS — D62 Acute posthemorrhagic anemia: Secondary | ICD-10-CM | POA: Diagnosis not present

## 2020-08-09 DIAGNOSIS — Z20822 Contact with and (suspected) exposure to covid-19: Secondary | ICD-10-CM | POA: Diagnosis present

## 2020-08-09 DIAGNOSIS — M1611 Unilateral primary osteoarthritis, right hip: Principal | ICD-10-CM | POA: Diagnosis present

## 2020-08-09 DIAGNOSIS — L930 Discoid lupus erythematosus: Secondary | ICD-10-CM | POA: Diagnosis not present

## 2020-08-09 DIAGNOSIS — Z7401 Bed confinement status: Secondary | ICD-10-CM | POA: Diagnosis not present

## 2020-08-09 DIAGNOSIS — Z7982 Long term (current) use of aspirin: Secondary | ICD-10-CM | POA: Diagnosis not present

## 2020-08-09 DIAGNOSIS — S32414A Nondisplaced fracture of anterior wall of right acetabulum, initial encounter for closed fracture: Secondary | ICD-10-CM | POA: Diagnosis not present

## 2020-08-09 DIAGNOSIS — M6281 Muscle weakness (generalized): Secondary | ICD-10-CM | POA: Diagnosis not present

## 2020-08-09 DIAGNOSIS — Z6832 Body mass index (BMI) 32.0-32.9, adult: Secondary | ICD-10-CM | POA: Diagnosis not present

## 2020-08-09 DIAGNOSIS — E785 Hyperlipidemia, unspecified: Secondary | ICD-10-CM | POA: Diagnosis present

## 2020-08-09 DIAGNOSIS — G8911 Acute pain due to trauma: Secondary | ICD-10-CM | POA: Diagnosis not present

## 2020-08-09 DIAGNOSIS — R2681 Unsteadiness on feet: Secondary | ICD-10-CM | POA: Diagnosis not present

## 2020-08-09 DIAGNOSIS — M5442 Lumbago with sciatica, left side: Secondary | ICD-10-CM | POA: Diagnosis not present

## 2020-08-09 DIAGNOSIS — G35 Multiple sclerosis: Secondary | ICD-10-CM | POA: Diagnosis present

## 2020-08-09 DIAGNOSIS — Z471 Aftercare following joint replacement surgery: Secondary | ICD-10-CM | POA: Diagnosis not present

## 2020-08-09 DIAGNOSIS — Z96641 Presence of right artificial hip joint: Secondary | ICD-10-CM | POA: Diagnosis not present

## 2020-08-09 DIAGNOSIS — M21371 Foot drop, right foot: Secondary | ICD-10-CM | POA: Diagnosis not present

## 2020-08-09 DIAGNOSIS — M545 Low back pain, unspecified: Secondary | ICD-10-CM | POA: Diagnosis not present

## 2020-08-09 DIAGNOSIS — M5416 Radiculopathy, lumbar region: Secondary | ICD-10-CM | POA: Diagnosis not present

## 2020-08-09 DIAGNOSIS — I10 Essential (primary) hypertension: Secondary | ICD-10-CM | POA: Diagnosis present

## 2020-08-09 DIAGNOSIS — Z419 Encounter for procedure for purposes other than remedying health state, unspecified: Secondary | ICD-10-CM

## 2020-08-09 DIAGNOSIS — F17210 Nicotine dependence, cigarettes, uncomplicated: Secondary | ICD-10-CM | POA: Diagnosis present

## 2020-08-09 DIAGNOSIS — R5381 Other malaise: Secondary | ICD-10-CM | POA: Diagnosis not present

## 2020-08-09 DIAGNOSIS — Z9889 Other specified postprocedural states: Secondary | ICD-10-CM | POA: Diagnosis not present

## 2020-08-09 DIAGNOSIS — R600 Localized edema: Secondary | ICD-10-CM | POA: Diagnosis not present

## 2020-08-09 DIAGNOSIS — Z720 Tobacco use: Secondary | ICD-10-CM | POA: Diagnosis not present

## 2020-08-09 HISTORY — PX: TOTAL HIP ARTHROPLASTY: SHX124

## 2020-08-09 LAB — TYPE AND SCREEN
ABO/RH(D): O POS
Antibody Screen: NEGATIVE

## 2020-08-09 LAB — ABO/RH: ABO/RH(D): O POS

## 2020-08-09 LAB — SARS CORONAVIRUS 2 BY RT PCR (HOSPITAL ORDER, PERFORMED IN ~~LOC~~ HOSPITAL LAB): SARS Coronavirus 2: NEGATIVE

## 2020-08-09 SURGERY — ARTHROPLASTY, HIP, TOTAL, ANTERIOR APPROACH
Anesthesia: Spinal | Site: Hip | Laterality: Right

## 2020-08-09 MED ORDER — DOCUSATE SODIUM 100 MG PO CAPS: 100.0000 mg | ORAL_CAPSULE | Freq: Two times a day (BID) | ORAL | 0 refills | Status: AC

## 2020-08-09 MED ORDER — MEPERIDINE HCL 50 MG/ML IJ SOLN: 6.2500 mg | INTRAMUSCULAR | Status: DC | PRN

## 2020-08-09 MED ORDER — BISACODYL 5 MG PO TBEC: 5.0000 mg | DELAYED_RELEASE_TABLET | Freq: Every day | ORAL | Status: DC | PRN

## 2020-08-09 MED ORDER — OXYCODONE-ACETAMINOPHEN 5-325 MG PO TABS: 1.0000 | ORAL_TABLET | Freq: Four times a day (QID) | ORAL | 0 refills | Status: DC | PRN

## 2020-08-09 MED ORDER — BUPIVACAINE IN DEXTROSE 0.75-8.25 % IT SOLN
INTRATHECAL | Status: DC | PRN
  Administered 2020-08-09: 2 mL via INTRATHECAL

## 2020-08-09 MED ORDER — ASPIRIN EC 325 MG PO TBEC
325.0000 mg | DELAYED_RELEASE_TABLET | Freq: Two times a day (BID) | ORAL | Status: DC
  Administered 2020-08-10 – 2020-08-13 (×8): 325 mg via ORAL
  Filled 2020-08-09 (×8): qty 1

## 2020-08-09 MED ORDER — FENTANYL CITRATE (PF) 100 MCG/2ML IJ SOLN
INTRAMUSCULAR | Status: AC
  Filled 2020-08-09: qty 2

## 2020-08-09 MED ORDER — DIPHENHYDRAMINE HCL 12.5 MG/5ML PO ELIX: 12.5000 mg | ORAL_SOLUTION | ORAL | Status: DC | PRN

## 2020-08-09 MED ORDER — ZOLPIDEM TARTRATE 5 MG PO TABS: 5.0000 mg | ORAL_TABLET | Freq: Every evening | ORAL | Status: DC | PRN

## 2020-08-09 MED ORDER — PHENYLEPHRINE HCL-NACL 10-0.9 MG/250ML-% IV SOLN
INTRAVENOUS | Status: AC
  Filled 2020-08-09: qty 250

## 2020-08-09 MED ORDER — AMLODIPINE BESYLATE 5 MG PO TABS
5.0000 mg | ORAL_TABLET | Freq: Every morning | ORAL | Status: DC
  Administered 2020-08-10 – 2020-08-13 (×4): 5 mg via ORAL
  Filled 2020-08-09 (×4): qty 1

## 2020-08-09 MED ORDER — CHLORHEXIDINE GLUCONATE CLOTH 2 % EX PADS: 6.0000 | MEDICATED_PAD | Freq: Every day | CUTANEOUS | Status: DC

## 2020-08-09 MED ORDER — PHENYLEPHRINE HCL (PRESSORS) 10 MG/ML IV SOLN
INTRAVENOUS | Status: DC | PRN
  Administered 2020-08-09: 160 ug via INTRAVENOUS
  Administered 2020-08-09 (×2): 80 ug via INTRAVENOUS
  Administered 2020-08-09 (×2): 120 ug via INTRAVENOUS

## 2020-08-09 MED ORDER — PROPOFOL 500 MG/50ML IV EMUL
INTRAVENOUS | Status: DC | PRN
  Administered 2020-08-09: 75 ug/kg/min via INTRAVENOUS

## 2020-08-09 MED ORDER — METHOCARBAMOL 500 MG IVPB - SIMPLE MED
500.0000 mg | Freq: Four times a day (QID) | INTRAVENOUS | Status: DC | PRN
  Filled 2020-08-09: qty 50

## 2020-08-09 MED ORDER — FENTANYL CITRATE (PF) 100 MCG/2ML IJ SOLN: 25.0000 ug | INTRAMUSCULAR | Status: DC | PRN

## 2020-08-09 MED ORDER — AMISULPRIDE (ANTIEMETIC) 5 MG/2ML IV SOLN
10.0000 mg | Freq: Once | INTRAVENOUS | Status: DC | PRN
Start: 2020-08-09 — End: 2020-08-09

## 2020-08-09 MED ORDER — PROPOFOL 1000 MG/100ML IV EMUL
INTRAVENOUS | Status: AC
  Filled 2020-08-09: qty 100

## 2020-08-09 MED ORDER — ONDANSETRON HCL 4 MG PO TABS: 4.0000 mg | ORAL_TABLET | Freq: Four times a day (QID) | ORAL | Status: DC | PRN

## 2020-08-09 MED ORDER — SODIUM CHLORIDE 0.9 % IV SOLN: Freq: Once | INTRAVENOUS | Status: DC

## 2020-08-09 MED ORDER — OXYCODONE HCL 5 MG PO TABS
ORAL_TABLET | ORAL | Status: AC
  Filled 2020-08-09: qty 1

## 2020-08-09 MED ORDER — FENTANYL CITRATE (PF) 100 MCG/2ML IJ SOLN
INTRAMUSCULAR | Status: DC | PRN
  Administered 2020-08-09: 100 ug via INTRAVENOUS

## 2020-08-09 MED ORDER — LACTATED RINGERS IV SOLN: INTRAVENOUS | Status: DC

## 2020-08-09 MED ORDER — BUPIVACAINE LIPOSOME 1.3 % IJ SUSP
INTRAMUSCULAR | Status: DC | PRN
  Administered 2020-08-09: 10 mL

## 2020-08-09 MED ORDER — TRANEXAMIC ACID-NACL 1000-0.7 MG/100ML-% IV SOLN
1000.0000 mg | Freq: Once | INTRAVENOUS | Status: AC
Start: 2020-08-09 — End: 2020-08-09
  Administered 2020-08-09: 1000 mg via INTRAVENOUS
  Filled 2020-08-09: qty 100

## 2020-08-09 MED ORDER — POLYETHYLENE GLYCOL 3350 17 G PO PACK
17.0000 g | PACK | Freq: Every day | ORAL | Status: DC | PRN
  Administered 2020-08-13: 17 g via ORAL
  Filled 2020-08-09: qty 1

## 2020-08-09 MED ORDER — MAGNESIUM CITRATE PO SOLN: 1.0000 | Freq: Once | ORAL | Status: DC | PRN

## 2020-08-09 MED ORDER — SODIUM CHLORIDE 0.9 % IV SOLN: INTRAVENOUS | Status: DC

## 2020-08-09 MED ORDER — ALBUTEROL SULFATE (2.5 MG/3ML) 0.083% IN NEBU: 3.0000 mL | INHALATION_SOLUTION | RESPIRATORY_TRACT | Status: DC | PRN

## 2020-08-09 MED ORDER — ACETAMINOPHEN 160 MG/5ML PO SOLN: 325.0000 mg | Freq: Once | ORAL | Status: DC | PRN

## 2020-08-09 MED ORDER — EPHEDRINE SULFATE 50 MG/ML IJ SOLN
INTRAMUSCULAR | Status: DC | PRN
  Administered 2020-08-09: 10 mg via INTRAVENOUS

## 2020-08-09 MED ORDER — VALACYCLOVIR HCL 500 MG PO TABS
500.0000 mg | ORAL_TABLET | Freq: Every day | ORAL | Status: DC
  Administered 2020-08-10 – 2020-08-13 (×4): 500 mg via ORAL
  Filled 2020-08-09 (×4): qty 1

## 2020-08-09 MED ORDER — TRANEXAMIC ACID-NACL 1000-0.7 MG/100ML-% IV SOLN
1000.0000 mg | INTRAVENOUS | Status: AC
  Administered 2020-08-09: 1000 mg via INTRAVENOUS
  Filled 2020-08-09: qty 100

## 2020-08-09 MED ORDER — FENTANYL CITRATE (PF) 100 MCG/2ML IJ SOLN: 25.0000 ug | Freq: Once | INTRAMUSCULAR | Status: DC

## 2020-08-09 MED ORDER — ALBUMIN HUMAN 5 % IV SOLN
INTRAVENOUS | Status: AC
  Filled 2020-08-09: qty 250

## 2020-08-09 MED ORDER — ACETAMINOPHEN 325 MG PO TABS: 325.0000 mg | ORAL_TABLET | Freq: Once | ORAL | Status: DC | PRN

## 2020-08-09 MED ORDER — CEFAZOLIN SODIUM-DEXTROSE 2-4 GM/100ML-% IV SOLN
2.0000 g | Freq: Four times a day (QID) | INTRAVENOUS | Status: AC
  Administered 2020-08-09 (×2): 2 g via INTRAVENOUS
  Filled 2020-08-09 (×2): qty 100

## 2020-08-09 MED ORDER — ONDANSETRON HCL 4 MG/2ML IJ SOLN
INTRAMUSCULAR | Status: DC | PRN
  Administered 2020-08-09: 4 mg via INTRAVENOUS

## 2020-08-09 MED ORDER — PROMETHAZINE HCL 25 MG/ML IJ SOLN
6.2500 mg | INTRAMUSCULAR | Status: DC | PRN
  Administered 2020-08-09: 12.5 mg via INTRAVENOUS

## 2020-08-09 MED ORDER — ACETAMINOPHEN 10 MG/ML IV SOLN
1000.0000 mg | Freq: Once | INTRAVENOUS | Status: DC | PRN
  Administered 2020-08-09: 1000 mg via INTRAVENOUS

## 2020-08-09 MED ORDER — MENTHOL 3 MG MT LOZG: 1.0000 | LOZENGE | OROMUCOSAL | Status: DC | PRN

## 2020-08-09 MED ORDER — METHOCARBAMOL 500 MG PO TABS
500.0000 mg | ORAL_TABLET | Freq: Four times a day (QID) | ORAL | Status: DC | PRN
  Administered 2020-08-09 – 2020-08-11 (×4): 500 mg via ORAL
  Filled 2020-08-09 (×5): qty 1

## 2020-08-09 MED ORDER — BUPIVACAINE-EPINEPHRINE 0.25% -1:200000 IJ SOLN
INTRAMUSCULAR | Status: DC | PRN
  Administered 2020-08-09: 30 mL

## 2020-08-09 MED ORDER — BACLOFEN 10 MG PO TABS
10.0000 mg | ORAL_TABLET | Freq: Four times a day (QID) | ORAL | Status: DC
  Administered 2020-08-09 – 2020-08-13 (×16): 10 mg via ORAL
  Filled 2020-08-09 (×16): qty 1

## 2020-08-09 MED ORDER — PHENYLEPHRINE HCL-NACL 10-0.9 MG/250ML-% IV SOLN
INTRAVENOUS | Status: DC | PRN
  Administered 2020-08-09: 40 ug/min via INTRAVENOUS

## 2020-08-09 MED ORDER — PROMETHAZINE HCL 25 MG/ML IJ SOLN
INTRAMUSCULAR | Status: AC
  Filled 2020-08-09: qty 1

## 2020-08-09 MED ORDER — POVIDONE-IODINE 10 % EX SWAB
2.0000 "application " | Freq: Once | CUTANEOUS | Status: AC
  Administered 2020-08-09: 2 via TOPICAL

## 2020-08-09 MED ORDER — ATORVASTATIN CALCIUM 10 MG PO TABS
10.0000 mg | ORAL_TABLET | Freq: Every morning | ORAL | Status: DC
  Administered 2020-08-10 – 2020-08-13 (×4): 10 mg via ORAL
  Filled 2020-08-09 (×4): qty 1

## 2020-08-09 MED ORDER — CEFAZOLIN SODIUM-DEXTROSE 2-4 GM/100ML-% IV SOLN
2.0000 g | INTRAVENOUS | Status: AC
  Administered 2020-08-09: 2 g via INTRAVENOUS
  Filled 2020-08-09: qty 100

## 2020-08-09 MED ORDER — ORAL CARE MOUTH RINSE: 15.0000 mL | Freq: Once | OROMUCOSAL | Status: AC

## 2020-08-09 MED ORDER — OXYCODONE HCL 5 MG PO TABS
5.0000 mg | ORAL_TABLET | ORAL | Status: DC | PRN
  Administered 2020-08-09: 5 mg via ORAL
  Administered 2020-08-09: 10 mg via ORAL
  Administered 2020-08-09 – 2020-08-10 (×3): 5 mg via ORAL
  Administered 2020-08-10 – 2020-08-13 (×11): 10 mg via ORAL
  Filled 2020-08-09 (×7): qty 2
  Filled 2020-08-09: qty 1
  Filled 2020-08-09 (×3): qty 2
  Filled 2020-08-09: qty 1
  Filled 2020-08-09: qty 2
  Filled 2020-08-09: qty 1
  Filled 2020-08-09: qty 2

## 2020-08-09 MED ORDER — GABAPENTIN 400 MG PO CAPS
400.0000 mg | ORAL_CAPSULE | Freq: Four times a day (QID) | ORAL | Status: DC
  Administered 2020-08-09 – 2020-08-13 (×16): 400 mg via ORAL
  Filled 2020-08-09 (×16): qty 1

## 2020-08-09 MED ORDER — ALBUMIN HUMAN 5 % IV SOLN
12.5000 g | Freq: Once | INTRAVENOUS | Status: AC
  Administered 2020-08-09: 12.5 g via INTRAVENOUS

## 2020-08-09 MED ORDER — PHENOL 1.4 % MT LIQD: 1.0000 | OROMUCOSAL | Status: DC | PRN

## 2020-08-09 MED ORDER — METHOCARBAMOL 500 MG PO TABS
ORAL_TABLET | ORAL | Status: AC
  Filled 2020-08-09: qty 1

## 2020-08-09 MED ORDER — HYDROMORPHONE HCL 1 MG/ML IJ SOLN
0.5000 mg | INTRAMUSCULAR | Status: DC | PRN
  Administered 2020-08-09: 0.5 mg via INTRAVENOUS
  Filled 2020-08-09: qty 1

## 2020-08-09 MED ORDER — ALUM & MAG HYDROXIDE-SIMETH 200-200-20 MG/5ML PO SUSP
30.0000 mL | ORAL | Status: DC | PRN
  Administered 2020-08-12: 30 mL via ORAL
  Filled 2020-08-09: qty 30

## 2020-08-09 MED ORDER — DIROXIMEL FUMARATE 231 MG PO CPDR
462.0000 mg | DELAYED_RELEASE_CAPSULE | Freq: Two times a day (BID) | ORAL | Status: DC
  Administered 2020-08-10 – 2020-08-13 (×6): 462 mg via ORAL
  Filled 2020-08-09: qty 1

## 2020-08-09 MED ORDER — ACETAMINOPHEN 325 MG PO TABS
325.0000 mg | ORAL_TABLET | Freq: Four times a day (QID) | ORAL | Status: DC | PRN
  Administered 2020-08-10: 650 mg via ORAL
  Filled 2020-08-09: qty 2

## 2020-08-09 MED ORDER — HYDROMORPHONE HCL 1 MG/ML IJ SOLN
0.2500 mg | INTRAMUSCULAR | Status: DC | PRN
  Administered 2020-08-09: 0.5 mg via INTRAVENOUS

## 2020-08-09 MED ORDER — ASPIRIN EC 325 MG PO TBEC
325.0000 mg | DELAYED_RELEASE_TABLET | Freq: Two times a day (BID) | ORAL | 0 refills | Status: DC
Start: 2020-08-09 — End: 2021-11-03

## 2020-08-09 MED ORDER — ONDANSETRON HCL 4 MG/2ML IJ SOLN: 4.0000 mg | Freq: Four times a day (QID) | INTRAMUSCULAR | Status: DC | PRN

## 2020-08-09 MED ORDER — ACETAMINOPHEN 10 MG/ML IV SOLN
INTRAVENOUS | Status: AC
  Filled 2020-08-09: qty 100

## 2020-08-09 MED ORDER — BUPIVACAINE-EPINEPHRINE (PF) 0.25% -1:200000 IJ SOLN
INTRAMUSCULAR | Status: AC
  Filled 2020-08-09: qty 30

## 2020-08-09 MED ORDER — CHLORHEXIDINE GLUCONATE 0.12 % MT SOLN
15.0000 mL | Freq: Once | OROMUCOSAL | Status: AC
  Administered 2020-08-09: 15 mL via OROMUCOSAL

## 2020-08-09 MED ORDER — DEXAMETHASONE SODIUM PHOSPHATE 10 MG/ML IJ SOLN
10.0000 mg | Freq: Two times a day (BID) | INTRAMUSCULAR | Status: AC
  Administered 2020-08-10 – 2020-08-11 (×2): 10 mg via INTRAVENOUS
  Filled 2020-08-09 (×2): qty 1

## 2020-08-09 MED ORDER — DOCUSATE SODIUM 100 MG PO CAPS
100.0000 mg | ORAL_CAPSULE | Freq: Two times a day (BID) | ORAL | Status: DC
  Administered 2020-08-09 – 2020-08-13 (×8): 100 mg via ORAL
  Filled 2020-08-09 (×8): qty 1

## 2020-08-09 MED ORDER — HYDROMORPHONE HCL 1 MG/ML IJ SOLN
INTRAMUSCULAR | Status: AC
  Administered 2020-08-09: 0.5 mg via INTRAVENOUS
  Filled 2020-08-09: qty 1

## 2020-08-09 SURGICAL SUPPLY — 44 items
BAG ZIPLOCK 12X15 (MISCELLANEOUS) IMPLANT
BENZOIN TINCTURE PRP APPL 2/3 (GAUZE/BANDAGES/DRESSINGS) IMPLANT
BLADE SAW SGTL 18X1.27X75 (BLADE) ×2 IMPLANT
BLADE SAW SGTL 18X1.27X75MM (BLADE) ×1
BLADE SURG SZ10 CARB STEEL (BLADE) ×6 IMPLANT
CLOSURE WOUND 1/2 X4 (GAUZE/BANDAGES/DRESSINGS)
COVER PERINEAL POST (MISCELLANEOUS) ×3 IMPLANT
COVER SURGICAL LIGHT HANDLE (MISCELLANEOUS) ×3 IMPLANT
COVER WAND RF STERILE (DRAPES) IMPLANT
CUP ACETBLR 52 OD 100 SERIES (Hips) ×3 IMPLANT
DECANTER SPIKE VIAL GLASS SM (MISCELLANEOUS) ×3 IMPLANT
DRAPE FOOT SWITCH (DRAPES) ×3 IMPLANT
DRAPE STERI IOBAN 125X83 (DRAPES) ×3 IMPLANT
DRAPE U-SHAPE 47X51 STRL (DRAPES) ×6 IMPLANT
DRSG AQUACEL AG ADV 3.5X 6 (GAUZE/BANDAGES/DRESSINGS) ×3 IMPLANT
DRSG AQUACEL AG ADV 3.5X10 (GAUZE/BANDAGES/DRESSINGS) ×3 IMPLANT
DURAPREP 26ML APPLICATOR (WOUND CARE) ×3 IMPLANT
ELECT BLADE TIP CTD 4 INCH (ELECTRODE) ×3 IMPLANT
ELECT REM PT RETURN 15FT ADLT (MISCELLANEOUS) ×3 IMPLANT
ELIMINATOR HOLE APEX DEPUY (Hips) ×3 IMPLANT
GAUZE XEROFORM 1X8 LF (GAUZE/BANDAGES/DRESSINGS) IMPLANT
GLOVE SRG 8 PF TXTR STRL LF DI (GLOVE) ×2 IMPLANT
GLOVE SURG LTX SZ7.5 (GLOVE) ×6 IMPLANT
GLOVE SURG UNDER POLY LF SZ8 (GLOVE) ×4
GOWN STRL REUS W/TWL XL LVL3 (GOWN DISPOSABLE) ×6 IMPLANT
HEAD CERAMIC DELTA 36 PLUS 1.5 (Hips) ×3 IMPLANT
HOLDER FOLEY CATH W/STRAP (MISCELLANEOUS) ×3 IMPLANT
HOOD PEEL AWAY FLYTE STAYCOOL (MISCELLANEOUS) ×6 IMPLANT
KIT TURNOVER KIT A (KITS) ×3 IMPLANT
LINER ACETAB NEUTRAL 36ID 520D (Liner) ×3 IMPLANT
NEEDLE HYPO 22GX1.5 SAFETY (NEEDLE) ×3 IMPLANT
PACK ANTERIOR HIP CUSTOM (KITS) ×3 IMPLANT
PENCIL SMOKE EVACUATOR (MISCELLANEOUS) IMPLANT
STAPLER VISISTAT 35W (STAPLE) IMPLANT
STEM CORAIL KA12 (Stem) ×3 IMPLANT
STRIP CLOSURE SKIN 1/2X4 (GAUZE/BANDAGES/DRESSINGS) IMPLANT
SUT ETHIBOND NAB CT1 #1 30IN (SUTURE) ×6 IMPLANT
SUT MNCRL AB 3-0 PS2 18 (SUTURE) IMPLANT
SUT VIC AB 0 CT1 36 (SUTURE) ×3 IMPLANT
SUT VIC AB 1 CT1 36 (SUTURE) ×3 IMPLANT
SUT VIC AB 2-0 CT1 27 (SUTURE) ×2
SUT VIC AB 2-0 CT1 TAPERPNT 27 (SUTURE) ×1 IMPLANT
TAPE STRIPS DRAPE STRL (GAUZE/BANDAGES/DRESSINGS) ×3 IMPLANT
TRAY FOLEY MTR SLVR 16FR STAT (SET/KITS/TRAYS/PACK) ×3 IMPLANT

## 2020-08-09 NOTE — Discharge Instructions (Signed)

## 2020-08-09 NOTE — Anesthesia Postprocedure Evaluation (Signed)
Anesthesia Post Note  Patient: Andrea Cantrell  Procedure(s) Performed: TOTAL HIP ARTHROPLASTY ANTERIOR APPROACH (Right: Hip)     Patient location during evaluation: PACU Anesthesia Type: Spinal Level of consciousness: oriented and awake and alert Pain management: pain level controlled Vital Signs Assessment: post-procedure vital signs reviewed and stable Respiratory status: spontaneous breathing, respiratory function stable and patient connected to nasal cannula oxygen Cardiovascular status: blood pressure returned to baseline and stable Postop Assessment: no headache, no backache and no apparent nausea or vomiting Anesthetic complications: no   No notable events documented.  Last Vitals:  Vitals:   08/09/20 1728 08/09/20 1749  BP: 119/70 119/66  Pulse: 76   Resp: 16   Temp: 37.2 C   SpO2: 98%     Last Pain:  Vitals:   08/09/20 1728  TempSrc: Oral  PainSc: 10-Worst pain ever                 Effie Berkshire

## 2020-08-09 NOTE — Transfer of Care (Signed)
Immediate Anesthesia Transfer of Care Note  Patient: Andrea Cantrell  Procedure(s) Performed: TOTAL HIP ARTHROPLASTY ANTERIOR APPROACH (Right: Hip)  Patient Location: PACU  Anesthesia Type:Spinal  Level of Consciousness: sedated, patient cooperative and responds to stimulation  Airway & Oxygen Therapy: Patient Spontanous Breathing and Patient connected to face mask oxygen  Post-op Assessment: Report given to RN and Post -op Vital signs reviewed and stable  Post vital signs: Reviewed and stable  Last Vitals:  Vitals Value Taken Time  BP    Temp    Pulse    Resp    SpO2      Last Pain:  Vitals:   08/09/20 1011  TempSrc:   PainSc: 0-No pain         Complications: No notable events documented.

## 2020-08-09 NOTE — Anesthesia Procedure Notes (Signed)
Spinal  Start time: 08/09/2020 11:42 AM End time: 08/09/2020 11:46 AM Reason for block: surgical anesthesia Staffing Performed: anesthesiologist  Anesthesiologist: Effie Berkshire, MD Preanesthetic Checklist Completed: patient identified, IV checked, site marked, risks and benefits discussed, surgical consent, monitors and equipment checked, pre-op evaluation and timeout performed Spinal Block Patient position: sitting Prep: DuraPrep and site prepped and draped Location: L3-4 Injection technique: single-shot Needle Needle type: Pencan  Needle gauge: 24 G Needle length: 10 cm Needle insertion depth: 10 cm Additional Notes Patient tolerated well. No immediate complications.

## 2020-08-10 ENCOUNTER — Observation Stay (HOSPITAL_COMMUNITY): Payer: PPO

## 2020-08-10 DIAGNOSIS — M545 Low back pain, unspecified: Secondary | ICD-10-CM | POA: Diagnosis not present

## 2020-08-10 LAB — CBC
HCT: 35.1 % — ABNORMAL LOW (ref 36.0–46.0)
Hemoglobin: 11.5 g/dL — ABNORMAL LOW (ref 12.0–15.0)
MCH: 29.5 pg (ref 26.0–34.0)
MCHC: 32.8 g/dL (ref 30.0–36.0)
MCV: 90 fL (ref 80.0–100.0)
Platelets: 304 10*3/uL (ref 150–400)
RBC: 3.9 MIL/uL (ref 3.87–5.11)
RDW: 13.7 % (ref 11.5–15.5)
WBC: 6.3 10*3/uL (ref 4.0–10.5)
nRBC: 0 % (ref 0.0–0.2)

## 2020-08-10 NOTE — Progress Notes (Signed)
Made Joanell Rising, PA aware of patient not being able to "dorsiflex" her right foot. Patient stated that weakness in right foot/leg is new. Joanell Rising, PA said he would be ordering MRI for patient.

## 2020-08-10 NOTE — TOC Progression Note (Signed)
Transition of Care [facility]) - Progression Note    Patient Details  Name: Andrea Cantrell MRN: [id number] Date of Birth: 21-Jan-1951  [facility] of [facility]) CM/SW Contact  Joaquin Courts, RN Phone Number: 08/10/2020, 11:00 AM  Clinical Narrative:    Patient plans to discharge home with therapy services provided by [facility].  [employer] to deliver rolling walker and 3in1.   Expected Discharge Plan: [place] Barriers to Discharge: No Barriers Identified  Expected Discharge Plan and Services Expected Discharge Plan: [place]   Discharge Planning Services: CM Consult Post Acute Care Choice: Home Health                   DME Arranged: 3-N-1 DME Agency: Other - Comment [facility]) Date DME Agency Contacted: 08/10/20 Time DME Agency Contacted: 1059 Representative spoke with at DME Agency: jermaine Benton: PT [place]: [place]     Representative spoke with at [place]: pre-arranged in md office   Social Determinants of Health (SDOH) Interventions    Readmission Risk Interventions No flowsheet data found.

## 2020-08-10 NOTE — Plan of Care (Signed)
  Problem: Education: Goal: Knowledge of General Education information will improve Description Including pain rating scale, medication(s)/side effects and non-pharmacologic comfort measures Outcome: Progressing   

## 2020-08-10 NOTE — Progress Notes (Signed)
Physical Therapy Treatment Patient Details Name: Andrea Cantrell MRN: [id number] DOB: 1950-12-11 Today's Date: 08/10/2020    History of Present Illness 70 yo female s/p R DA THA. PMH: MS, RCR, HTN    PT Comments    Pt progressing slowly. Able to take a few steps in room. Has little assist at home and needs to be independent to return home, has 15-20 steps to enter her apt. Pt may need SNF placement if continued slower progress. Pt and dtr concerned about d/c plan  Follow Up Recommendations  Follow surgeon's recommendation for DC plan and follow-up therapies;SNF     Equipment Recommendations  Rolling walker with 5" wheels;3in1 (PT)    Recommendations for Other Services       Precautions / Restrictions Precautions Precautions: Fall Restrictions Weight Bearing Restrictions: No RLE Weight Bearing: Weight bearing as tolerated    Mobility  Bed Mobility Overal bed mobility: Needs Assistance Bed Mobility: Sit to Supine       Sit to supine: Min assist   General bed mobility comments: assist with RLE, incr time and effort    Transfers Overall transfer level: Needs assistance Equipment used: Rolling walker (2 wheeled) Transfers: Sit to/from Stand Sit to Stand: Min assist;Mod assist;+2 physical assistance;+2 safety/equipment         General transfer comment: cues for hand placement and RLE position. incr time, effortful transitions. sit to stand x2  Ambulation/Gait Ambulation/Gait assistance: Min assist;+2 safety/equipment Gait Distance (Feet): 4 Feet Assistive device: Rolling walker (2 wheeled) Gait Pattern/deviations: Step-to pattern;Decreased stance time - right Gait velocity: decr   General Gait Details: cues for sequence, pivotal and steps back to bed from [place]             Wheelchair Mobility    Modified Rankin (Stroke Patients Only)       Balance                                            Cognition  Arousal/Alertness: Awake/alert Behavior During Therapy: WFL for tasks assessed/performed;Anxious Overall Cognitive Status: Within Functional Limits for tasks assessed                                        Exercises Total Joint Exercises Ankle Circles/Pumps: AROM;PROM;Both;10 reps Quad Sets: AROM;Both;10 reps Heel Slides: AAROM;Right;10 reps    General Comments        Pertinent Vitals/Pain Pain Assessment: 0-10 Pain Score: 7  Pain Location: R hip Pain Descriptors / Indicators: Grimacing;Sore;Guarding Pain Intervention(s): Limited activity within patient's tolerance;Monitored during session;Repositioned;Premedicated before session    Home Living                      Prior Function            PT Goals (current goals can now be found in the care plan section) Acute Rehab PT Goals Patient Stated Goal: have less pain PT Goal Formulation: With patient Time For Goal Achievement: 08/23/20 Potential to Achieve Goals: Good Progress towards PT goals: Progressing toward goals    Frequency    7X/week      PT Plan Current plan remains appropriate    Co-evaluation              AM-PAC PT "  6 Clicks" Mobility   Outcome Measure  Help needed turning from your back to your side while in a flat bed without using bedrails?: A Lot Help needed moving from lying on your back to sitting on the side of a flat bed without using bedrails?: A Lot Help needed moving to and from a bed to a chair (including a wheelchair)?: A Lot Help needed standing up from a chair using your arms (e.g., wheelchair or bedside chair)?: A Lot Help needed to walk in hospital room?: A Lot Help needed climbing 3-5 steps with a railing? : Total 6 Click Score: 11    End of Session Equipment Utilized During Treatment: Gait belt Activity Tolerance: Patient limited by fatigue;Patient limited by pain Patient left: in bed;with call bell/phone within reach;with bed alarm set;with  family/visitor present Nurse Communication: Mobility status PT Visit Diagnosis: Other abnormalities of gait and mobility (R26.89);Difficulty in walking, not elsewhere classified (R26.2)     Time: 1355-1420 PT Time Calculation (min) (ACUTE ONLY): 25 min  Charges:  $Gait Training: 8-22 mins $Therapeutic Activity: 8-22 mins                     Baxter Flattery, PT  Acute Rehab Dept ([place]) [phone] Pager [phone]  08/10/2020    [facility] 08/10/2020, 2:27 PM

## 2020-08-10 NOTE — Progress Notes (Addendum)
PATIENT ID: Andrea Cantrell  MRN: [id number]  DOB/AGE:  [birth year] / 70 y.o.  1 Day Post-Op Procedure(s) (LRB): TOTAL HIP ARTHROPLASTY ANTERIOR APPROACH (Right)    PROGRESS NOTE Subjective: Patient is alert, oriented, no Nausea, no Vomiting, yes passing gas, . Taking PO well. Denies SOB, Chest or Calf Pain. Using Incentive Spirometer, PAS in place. Ambulate WBAT Patient reports pain as  5/10  .    Objective: Vital signs in last 24 hours: Vitals:   08/09/20 1749 08/09/20 1949 08/10/20 0135 08/10/20 0612  BP: 119/66 123/65 (!) 123/58 136/69  Pulse:  79 86 86  Resp:  20 18 (!) 24  Temp:  98 F (36.7 C) 99.8 F (37.7 C) 99.6 F (37.6 C)  TempSrc:  Oral Oral Oral  SpO2:  96% 96% 96%  Weight: 81.2 kg     Height: 5' 1.89" (1.572 m)         Intake/Output from previous day: I/O last 3 completed shifts: In: 2980 [P.O.:780; I.V.:2200] Out: 1700 [Urine:1450; Blood:250]   Intake/Output this shift: No intake/output data recorded.   LABORATORY DATA: Recent Labs    08/10/20 0351  WBC 6.3  HGB 11.5*  HCT 35.1*  PLT 304    Examination: Neurovascular intact Intact pulses distally Incision: dressing C/D/I No cellulitis present Compartment soft} XR AP&Lat of hip shows well placed\fixed THA Pt has weakness with dorsiflexion in right foot. Per the patient this is new. She does mention she has history oif lowback pain and MS  Assessment:   1 Day Post-Op Procedure(s) (LRB): TOTAL HIP ARTHROPLASTY ANTERIOR APPROACH (Right) ADDITIONAL DIAGNOSIS:  Expected Acute Blood Loss Anemia, Hypertension and MS, LBP with radiculopathy  Patient's anticipated LOS is less than 2 midnights, meeting these requirements: - Younger than 5 - Lives within 1 hour of care - Has a competent adult at home to recover with post-op recover - NO history of  - Chronic pain requiring opiods  - Diabetes  - Coronary Artery Disease  - Heart failure  - Heart attack  - Stroke  - DVT/VTE  - Cardiac  arrhythmia  - Respiratory Failure/COPD  - Renal failure  - Anemia  - Advanced Liver disease     Plan: PT/OT WBAT, THA  DVT Prophylaxis: SCDx72 hrs, ASA 325 mg BID x 2 weeks  DISCHARGE PLAN: Home  DISCHARGE NEEDS: HHPT, Walker, and 3-in-1 comode seat   Possible new onset right foot drop.  Will order MRI of Lumbar spine and await results.

## 2020-08-10 NOTE — Evaluation (Signed)
Physical Therapy Evaluation Patient Details Name: Andrea Cantrell MRN: [id number] DOB: 09/28/1950 Today's Date: 08/10/2020   History of Present Illness  70 yo female s/p R DA THA. PMH: MS, RCR, HTN  Clinical Impression  Pt is s/p THA resulting in the deficits listed below (see PT Problem List).  Pt limited to be to chair transfers d/t significant pain and fatigue; pt requires incr time and effort for transitions. Pt with 0/5 df R ankle and diminished sensation distal to knee (decr to light touch, able to discern pressure). PA is aware. Pt states this is new, she did not have numbness or ankle weakness prior to surgery.  Pt lives in a second level apartment with 15  steps to enter, she will have to make significant progress to be able to return home, may need SNF if progress slow.   Pt will benefit from skilled PT to increase their independence and safety with mobility to allow discharge to the venue listed below.      Follow Up Recommendations Follow surgeon's recommendation for DC plan and follow-up therapies;SNF (may need SNF)    Equipment Recommendations  Rolling walker with 5" wheels;3in1 (PT)    Recommendations for Other Services       Precautions / Restrictions Precautions Precautions: Fall Restrictions Weight Bearing Restrictions: No RLE Weight Bearing: Weight bearing as tolerated      Mobility  Bed Mobility Overal bed mobility: Needs Assistance Bed Mobility: Supine to Sit     Supine to sit: Min assist     General bed mobility comments: assist with RLE, incr time and effort    Transfers Overall transfer level: Needs assistance Equipment used: Rolling walker (2 wheeled) Transfers: Sit to/from [facility] Sit to Stand: Min assist;Mod assist;+2 physical assistance;+2 safety/equipment Stand pivot transfers: Min assist;+2 safety/equipment;+2 physical assistance       General transfer comment: assist to rise and transition to RW. incr time and  effort; pt limited to pivotal steps bed to chair d/t pain and fatigue  Ambulation/Gait                Stairs            Wheelchair Mobility    Modified Rankin (Stroke Patients Only)       Balance Overall balance assessment: Needs assistance Sitting-balance support: Feet supported;No upper extremity supported Sitting balance-Leahy Scale: Fair       Standing balance-Leahy Scale: Poor Standing balance comment: heavily reliant on UEs                             Pertinent Vitals/Pain Pain Assessment: 0-10 Pain Score: 4  Pain Location: R hip Pain Descriptors / Indicators: Grimacing;Sore;Guarding Pain Intervention(s): Limited activity within patient's tolerance;Monitored during session;Premedicated before session    Home Living Family/patient expects to be discharged to:: Private residence Living Arrangements: Children;Spouse/significant other Available Help at Discharge: Family Type of Home: Apartment Home Access: Stairs to enter   Technical brewer of Steps: 15 Home Layout: One level Home Equipment: None      Prior Function Level of Independence: Independent               Hand Dominance        Extremity/Trunk Assessment   Upper Extremity Assessment Upper Extremity Assessment: Overall WFL for tasks assessed    Lower Extremity Assessment Lower Extremity Assessment: RLE deficits/detail RLE Deficits / Details: ankle df 0/5, pf 2/5; knee and hip testing  limited by pain RLE: Unable to fully assess due to pain RLE Sensation: decreased light touch (intact to light pressure; diminished sensation to light touch distal to knee)       Communication   Communication: No difficulties  Cognition Arousal/Alertness: Awake/alert Behavior During Therapy: WFL for tasks assessed/performed Overall Cognitive Status: Within Functional Limits for tasks assessed                                        General Comments       Exercises     Assessment/Plan    PT Assessment Patient needs continued PT services  PT Problem List Decreased strength;Decreased activity tolerance;Decreased balance;Decreased mobility;Decreased range of motion;Decreased knowledge of use of DME;Pain;Obesity       PT Treatment Interventions DME instruction;Therapeutic activities;Gait training;Functional mobility training;Therapeutic exercise;Patient/family education;Stair training;Balance training    PT Goals (Current goals can be found in the Care Plan section)  Acute Rehab PT Goals Patient Stated Goal: have less pain PT Goal Formulation: With patient Time For Goal Achievement: 08/23/20 Potential to Achieve Goals: Good    Frequency 7X/week   Barriers to discharge        Co-evaluation               AM-PAC PT "6 Clicks" Mobility  Outcome Measure Help needed turning from your back to your side while in a flat bed without using bedrails?: A Lot Help needed moving from lying on your back to sitting on the side of a flat bed without using bedrails?: A Lot Help needed moving to and from a bed to a chair (including a wheelchair)?: A Lot Help needed standing up from a chair using your arms (e.g., wheelchair or bedside chair)?: A Lot Help needed to walk in hospital room?: Total Help needed climbing 3-5 steps with a railing? : Total 6 Click Score: 10    End of Session Equipment Utilized During Treatment: Gait belt Activity Tolerance: Patient limited by fatigue;Patient limited by pain Patient left: in chair;with call bell/phone within reach;with chair alarm set Nurse Communication: Mobility status PT Visit Diagnosis: Other abnormalities of gait and mobility (R26.89);Difficulty in walking, not elsewhere classified (R26.2)    Time: 0814-4818 PT Time Calculation (min) (ACUTE ONLY): 20 min   Charges:   PT Evaluation $PT Eval Low Complexity: Electra, PT  Acute Rehab Dept ([place]) [phone] Pager  [phone]  08/10/2020   [facility] 08/10/2020, 10:28 AM

## 2020-08-10 NOTE — Progress Notes (Signed)
Patient is being transported to MRI.

## 2020-08-11 ENCOUNTER — Encounter (HOSPITAL_COMMUNITY): Payer: Self-pay | Admitting: Orthopedic Surgery

## 2020-08-11 DIAGNOSIS — Z96641 Presence of right artificial hip joint: Secondary | ICD-10-CM | POA: Diagnosis not present

## 2020-08-11 DIAGNOSIS — M1611 Unilateral primary osteoarthritis, right hip: Secondary | ICD-10-CM | POA: Diagnosis present

## 2020-08-11 DIAGNOSIS — Z9889 Other specified postprocedural states: Secondary | ICD-10-CM | POA: Diagnosis not present

## 2020-08-11 DIAGNOSIS — Z79899 Other long term (current) drug therapy: Secondary | ICD-10-CM | POA: Diagnosis not present

## 2020-08-11 DIAGNOSIS — Z471 Aftercare following joint replacement surgery: Secondary | ICD-10-CM | POA: Diagnosis not present

## 2020-08-11 DIAGNOSIS — F17210 Nicotine dependence, cigarettes, uncomplicated: Secondary | ICD-10-CM | POA: Diagnosis present

## 2020-08-11 DIAGNOSIS — D62 Acute posthemorrhagic anemia: Secondary | ICD-10-CM | POA: Diagnosis not present

## 2020-08-11 DIAGNOSIS — Z7982 Long term (current) use of aspirin: Secondary | ICD-10-CM | POA: Diagnosis not present

## 2020-08-11 DIAGNOSIS — E785 Hyperlipidemia, unspecified: Secondary | ICD-10-CM | POA: Diagnosis present

## 2020-08-11 DIAGNOSIS — G35 Multiple sclerosis: Secondary | ICD-10-CM | POA: Diagnosis present

## 2020-08-11 DIAGNOSIS — M21371 Foot drop, right foot: Secondary | ICD-10-CM | POA: Diagnosis not present

## 2020-08-11 DIAGNOSIS — Z6832 Body mass index (BMI) 32.0-32.9, adult: Secondary | ICD-10-CM | POA: Diagnosis not present

## 2020-08-11 DIAGNOSIS — Z20822 Contact with and (suspected) exposure to covid-19: Secondary | ICD-10-CM | POA: Diagnosis present

## 2020-08-11 DIAGNOSIS — G8929 Other chronic pain: Secondary | ICD-10-CM | POA: Diagnosis present

## 2020-08-11 DIAGNOSIS — I10 Essential (primary) hypertension: Secondary | ICD-10-CM | POA: Diagnosis present

## 2020-08-11 DIAGNOSIS — S32414A Nondisplaced fracture of anterior wall of right acetabulum, initial encounter for closed fracture: Secondary | ICD-10-CM | POA: Diagnosis not present

## 2020-08-11 DIAGNOSIS — E669 Obesity, unspecified: Secondary | ICD-10-CM | POA: Diagnosis present

## 2020-08-11 LAB — CBC
HCT: 41 % (ref 36.0–46.0)
Hemoglobin: 13.5 g/dL (ref 12.0–15.0)
MCH: 29.7 pg (ref 26.0–34.0)
MCHC: 32.9 g/dL (ref 30.0–36.0)
MCV: 90.1 fL (ref 80.0–100.0)
Platelets: 335 10*3/uL (ref 150–400)
RBC: 4.55 MIL/uL (ref 3.87–5.11)
RDW: 13.8 % (ref 11.5–15.5)
WBC: 9.9 10*3/uL (ref 4.0–10.5)
nRBC: 0 % (ref 0.0–0.2)

## 2020-08-11 MED ORDER — PREDNISONE 10 MG (21) PO TBPK
20.0000 mg | ORAL_TABLET | Freq: Every evening | ORAL | Status: AC
  Administered 2020-08-11: 20 mg via ORAL

## 2020-08-11 MED ORDER — PREDNISONE 10 MG (21) PO TBPK
10.0000 mg | ORAL_TABLET | Freq: Three times a day (TID) | ORAL | Status: AC
  Administered 2020-08-12 (×3): 10 mg via ORAL

## 2020-08-11 MED ORDER — PREDNISONE 10 MG (21) PO TBPK
10.0000 mg | ORAL_TABLET | Freq: Four times a day (QID) | ORAL | Status: DC
  Administered 2020-08-13 (×3): 10 mg via ORAL

## 2020-08-11 MED ORDER — PREDNISONE 10 MG (21) PO TBPK
20.0000 mg | ORAL_TABLET | Freq: Every evening | ORAL | Status: AC
  Administered 2020-08-12: 20 mg via ORAL

## 2020-08-11 MED ORDER — PREDNISONE 10 MG (21) PO TBPK
20.0000 mg | ORAL_TABLET | Freq: Every morning | ORAL | Status: AC
  Administered 2020-08-11: 20 mg via ORAL
  Filled 2020-08-11: qty 21

## 2020-08-11 MED ORDER — PREDNISONE 10 MG (21) PO TBPK
10.0000 mg | ORAL_TABLET | ORAL | Status: AC
  Administered 2020-08-11: 10 mg via ORAL

## 2020-08-11 MED ORDER — DEXAMETHASONE SODIUM PHOSPHATE 10 MG/ML IJ SOLN
10.0000 mg | Freq: Once | INTRAMUSCULAR | Status: AC
  Administered 2020-08-11: 10 mg via INTRAVENOUS

## 2020-08-11 NOTE — Progress Notes (Signed)
Physical Therapy Treatment Patient Details Name: Andrea Cantrell MRN: [id number] DOB: 1950-08-18 Today's Date: 08/11/2020    History of Present Illness 70 yo female s/p R DA THA. PMH: MS, RCR, HTN    PT Comments    Pt progressing, pain better controlled. Able to amb short distance into hallway. Fatigues easily. Continued drop foot R, decr sensation R lower leg requiring assist to advance RLE at times. Continue to recommend SNF post acute    Follow Up Recommendations  Follow surgeon's recommendation for DC plan and follow-up therapies;SNF     Equipment Recommendations  Rolling walker with 5" wheels;3in1 (PT)    Recommendations for Other Services       Precautions / Restrictions Precautions Precautions: Fall Restrictions RLE Weight Bearing: Weight bearing as tolerated    Mobility  Bed Mobility Overal bed mobility: Needs Assistance Bed Mobility: Sit to Supine       Sit to supine: Min assist   General bed mobility comments: assist with RLE, incr time and effort, pt able to use gait belt as leg lifter    Transfers Overall transfer level: Needs assistance Equipment used: Rolling walker (2 wheeled) Transfers: Sit to/from Stand Sit to Stand: Min assist         General transfer comment: cues for hand placement and RLE position. incr time  Ambulation/Gait Ambulation/Gait assistance: Min assist Gait Distance (Feet): 12 Feet Assistive device: Rolling walker (2 wheeled) Gait Pattern/deviations: Step-to pattern;Decreased stance time - right;Decreased dorsiflexion - right Gait velocity: decr   General Gait Details: cues for sequence, intermittent assist to advance RLE   Stairs             Wheelchair Mobility    Modified Rankin (Stroke Patients Only)       Balance             Standing balance-Leahy Scale: Poor Standing balance comment: heavily reliant on UEs                            Cognition Arousal/Alertness: Awake/alert Behavior  During Therapy: WFL for tasks assessed/performed;Anxious Overall Cognitive Status: Within Functional Limits for tasks assessed                                        Exercises Total Joint Exercises Ankle Circles/Pumps: AROM;PROM;Both;10 reps    General Comments        Pertinent Vitals/Pain Pain Assessment: 0-10 Pain Score: 5  Pain Location: R hip Pain Descriptors / Indicators: Grimacing;Sore;Guarding Pain Intervention(s): Limited activity within patient's tolerance;Monitored during session;Premedicated before session;Repositioned    Home Living                      Prior Function            PT Goals (current goals can now be found in the care plan section) Acute Rehab PT Goals Patient Stated Goal: have less pain PT Goal Formulation: With patient Time For Goal Achievement: 08/23/20 Potential to Achieve Goals: Good Progress towards PT goals: Progressing toward goals    Frequency    7X/week      PT Plan Current plan remains appropriate    Co-evaluation              AM-PAC PT "6 Clicks" Mobility   Outcome Measure  Help needed turning from your back to your  side while in a flat bed without using bedrails?: A Lot Help needed moving from lying on your back to sitting on the side of a flat bed without using bedrails?: A Lot Help needed moving to and from a bed to a chair (including a wheelchair)?: A Lot Help needed standing up from a chair using your arms (e.g., wheelchair or bedside chair)?: A Lot Help needed to walk in hospital room?: A Lot Help needed climbing 3-5 steps with a railing? : Total 6 Click Score: 11    End of Session Equipment Utilized During Treatment: Gait belt Activity Tolerance: Patient limited by fatigue;Patient limited by pain Patient left: with call bell/phone within reach;with family/visitor present;in chair;Other (comment) (family wanted to take pt around unit recliner, RN aware) Nurse Communication: Mobility  status PT Visit Diagnosis: Other abnormalities of gait and mobility (R26.89);Difficulty in walking, not elsewhere classified (R26.2)     Time: 0037-0488 PT Time Calculation (min) (ACUTE ONLY): 16 min  Charges:  $Gait Training: 8-22 mins                     Baxter Flattery, PT  Acute Rehab Dept ([place]) [phone] Pager [phone]  08/11/2020    [facility] 08/11/2020, 11:56 AM

## 2020-08-11 NOTE — Plan of Care (Signed)
Plan of care reviewed and discussed with the patient. 

## 2020-08-11 NOTE — Progress Notes (Signed)
Rn called Joanell Rising PA to inform him of the need for snf at d/c for rehab. Rn has also reached out to csw about this need as well.

## 2020-08-11 NOTE — Progress Notes (Signed)
Physical Therapy Treatment Patient Details Name: Andrea Cantrell MRN: [id number] DOB: 23-May-1950 Today's Date: 08/11/2020    History of Present Illness 70 yo female s/p R DA THA. PMH: MS, RCR, HTN    PT Comments    Slow but steady toward PT goals, amb ~ 22' with min assist and incr time. Continues to exhibit drop foot on R/significant toe drag but was able to advance RLE consistently.  recommend SNF post acute    Follow Up Recommendations  Follow surgeon's recommendation for DC plan and follow-up therapies;SNF     Equipment Recommendations  Rolling walker with 5" wheels;3in1 (PT)    Recommendations for Other Services       Precautions / Restrictions Precautions Precautions: Fall Restrictions RLE Weight Bearing: Weight bearing as tolerated    Mobility  Bed Mobility Overal bed mobility: Needs Assistance Bed Mobility: Sit to Supine       Sit to supine: Min assist;Mod assist   General bed mobility comments: assist with LEs, incr time and effort, pt able to use gait belt as leg lifter. cues for technique    Transfers Overall transfer level: Needs assistance Equipment used: Rolling walker (2 wheeled) Transfers: Sit to/from Stand Sit to Stand: Min assist;Min guard         General transfer comment: cues for hand placement and RLE position. incr time  Ambulation/Gait Ambulation/Gait assistance: Min assist Gait Distance (Feet): 22 Feet Assistive device: Rolling walker (2 wheeled) Gait Pattern/deviations: Step-to pattern;Decreased stance time - right;Decreased dorsiflexion - right Gait velocity: decr   General Gait Details: cues for sequence, RW position and safety   Stairs             Wheelchair Mobility    Modified Rankin (Stroke Patients Only)       Balance             Standing balance-Leahy Scale: Poor Standing balance comment: heavily reliant on UEs                            Cognition Arousal/Alertness:  Awake/alert Behavior During Therapy: WFL for tasks assessed/performed;Anxious Overall Cognitive Status: Within Functional Limits for tasks assessed                                        Exercises Total Joint Exercises Ankle Circles/Pumps: AROM;PROM;Both;10 reps Quad Sets: AROM;Both;10 reps Heel Slides: AAROM;Right;10 reps Hip ABduction/ADduction: AAROM;Right;10 reps    General Comments        Pertinent Vitals/Pain Pain Assessment: 0-10 Pain Score: 4  Pain Location: R hip Pain Descriptors / Indicators: Grimacing;Sore;Guarding Pain Intervention(s): Limited activity within patient's tolerance;Monitored during session;Repositioned    Home Living                      Prior Function            PT Goals (current goals can now be found in the care plan section) Acute Rehab PT Goals Patient Stated Goal: have less pain PT Goal Formulation: With patient Time For Goal Achievement: 08/23/20 Potential to Achieve Goals: Good Progress towards PT goals: Progressing toward goals    Frequency    7X/week      PT Plan Current plan remains appropriate    Co-evaluation              AM-PAC PT "6 Clicks"  Mobility   Outcome Measure  Help needed turning from your back to your side while in a flat bed without using bedrails?: A Lot Help needed moving from lying on your back to sitting on the side of a flat bed without using bedrails?: A Lot Help needed moving to and from a bed to a chair (including a wheelchair)?: A Little Help needed standing up from a chair using your arms (e.g., wheelchair or bedside chair)?: A Little Help needed to walk in hospital room?: A Little Help needed climbing 3-5 steps with a railing? : A Lot 6 Click Score: 15    End of Session Equipment Utilized During Treatment: Gait belt Activity Tolerance: Patient limited by fatigue;Patient limited by pain Patient left: with call bell/phone within reach;with family/visitor  present;in chair;Other (comment) (family wanted to take pt around unit recliner, RN aware) Nurse Communication: Mobility status PT Visit Diagnosis: Other abnormalities of gait and mobility (R26.89);Difficulty in walking, not elsewhere classified (R26.2)     Time: 5501-5868 PT Time Calculation (min) (ACUTE ONLY): 20 min  Charges:  $Gait Training: 8-22 mins                     Baxter Flattery, PT  Acute Rehab Dept ([place]) [phone] Pager [phone]  08/11/2020    [facility] 08/11/2020, 2:38 PM

## 2020-08-11 NOTE — Progress Notes (Signed)
PATIENT ID: Andrea Cantrell  MRN: [id number]  DOB/AGE:  [birth year] / 70 y.o.  2 Days Post-Op Procedure(s) (LRB): TOTAL HIP ARTHROPLASTY ANTERIOR APPROACH (Right)    PROGRESS NOTE Subjective: Patient is alert, oriented, no Nausea, no Vomiting, yes passing gas, . Taking PO well. Denies SOB, Chest or Calf Pain. Using Incentive Spirometer, PAS in place. Ambulate WBAT with pt working on transfers and walking 4 ft with therapy Patient reports pain as  3/10  .    Objective: Vital signs in last 24 hours: Vitals:   08/10/20 0953 08/10/20 1355 08/10/20 2125 08/11/20 0540  BP: 128/64 123/63 130/62 (!) 118/59  Pulse: 84 89 76 79  Resp: (!) 22 18 14 16   Temp: 98.6 F (37 C) 98.8 F (37.1 C) 99.3 F (37.4 C) 98.9 F (37.2 C)  TempSrc: Oral Oral Oral Oral  SpO2: 93% 94% 96% 92%  Weight:      Height:          Intake/Output from previous day: I/O last 3 completed shifts: In: 2540 [P.O.:1340; I.V.:1200] Out: 2100 [Urine:2100]   Intake/Output this shift: No intake/output data recorded.   LABORATORY DATA: Recent Labs    08/10/20 0351 08/11/20 0256  WBC 6.3 9.9  HGB 11.5* 13.5  HCT 35.1* 41.0  PLT 304 335    Examination: ABD soft Neurovascular intact Intact pulses distally Incision: dressing C/D/I No cellulitis present Compartment soft} XR AP&Lat of hip shows well placed\fixed THA Pt continues to have a right foot drop/weakness with dorsiflexion and weakness with firing quadriceps. Decreased sensation over right lateral foot, lateral lower leg and 1st webspace of toes.   Assessment:   2 Days Post-Op Procedure(s) (LRB): TOTAL HIP ARTHROPLASTY ANTERIOR APPROACH (Right) ADDITIONAL DIAGNOSIS:  Expected Acute Blood Loss Anemia, Hypertension and MS Anticipated LOS equal to or greater than 2 midnights due to - Age 71 and older with one or more of the following:  - Obesity  - Expected need for hospital services (PT, OT, Nursing) required for safe  discharge  - Anticipated need for  postoperative skilled nursing care or inpatient rehab  - Active co-morbidities: MS OR   - Unanticipated findings during/Post Surgery: right foot drop   - Patient is a high risk of re-admission due to: Barriers to post-acute care (logistical, no family support in home)   Plan: PT/OT WBAT, THA  DVT Prophylaxis: SCDx72 hrs, ASA 81 mg BID x 2 weeks  DISCHARGE PLAN: SNF vs home will depend on pt's ability to advance with therapy safely  DISCHARGE NEEDS: HHPT, Walker, and 3-in-1 comode seat

## 2020-08-11 NOTE — Progress Notes (Signed)
Attempted to call the [facility] ortho tech today about the ankle foot orthosis order that was written by the physician today. Was unable to reach the [facility] ortho tech (call went to voicemail box that hasn't been set up yet). Called the [facility] ortho tech and was given a number to call Andrea Cantrell. Andrea Cantrell made aware and stated he was going follow-up about that order.

## 2020-08-11 NOTE — Op Note (Addendum)
PATIENT ID:      Andrea Cantrell  MRN:     [id number] DOB/AGE:    20-Oct-1950 / 70 y.o.       OPERATIVE REPORT    DATE OF PROCEDURE:  08/11/2020       PREOPERATIVE DIAGNOSIS:  RIGHT HIP OSTEOARTHRITIS                                                       Estimated body mass index is 32.86 kg/m as calculated from the following:   Height as of this encounter: 5' 1.89" (1.572 m).   Weight as of this encounter: 81.2 kg.     POSTOPERATIVE DIAGNOSIS:  RIGHT HIP OSTEOARTHRITIS                                                           PROCEDURE:  1.right  total hip arthroplasty using a 52 mm DePuy Pinnacle Porocoated Cup, [employer], neutral polyethylene liner  , a +1.5 36 mm ceramic head, a #12 standard offset Corail  stem,  2.interpretation of multiple intraoperative fluoroscopic images  SURGEON: Alta Corning    ASSISTANT:  Nehemiah Massed PA-C  (present throughout entire procedure and necessary for timely completion of the procedure)  ANESTHESIA: spinal  BLOOD LOSS: 300 cc approximately FLUID REPLACEMENT: unk crystalloid Tranexamic Acid: 1 g IV DRAINS: None COMPLICATIONS: None    INDICATIONS FOR PROCEDURE:Patient with end-stage arthritis of the right hip.  X-rays show bone-on-bone arthritic changes. Despite conservative measures with observation, anti-inflammatory medicine, narcotics, use of a cane, has severe unremitting pain and can ambulate only less than 1 block before resting.  Patient desires elective right total hip arthroplasty to decrease pain and increase function. The risks, benefits, and alternatives were discussed at length including but not limited to the risks of infection, bleeding, nerve injury, stiffness, blood clots, the need for revision surgery, cardiopulmonary complications, among others, and they were willing to proceed.Benefits have been discussed. Questions answered.     PROCEDURE IN DETAIL: The patient was identified by armband,  received preoperative IV  antibiotics in the holding area at [facility], taken to the operating room , appropriate anesthetic monitors  were attached and spinal anesthesia was induced.  The patient was placed onto the hot bed and all bony prominences were well-padded.The right hip was prepped and draped for an anterior approach to the hip.  An incision was made and the subcutaneous dissection was down to the level of the tensor fascia.  The fascia was opened and finger dissected.  The bleeders coming across the anterior portion of the hip were identified and cauterized. Retractors were put in place above and below the femoral neck.  The capsule was opened and tagged and a provisional neck cut was made.  The head was removed and sized on the back table.  The acetabulum was sequentially reamed to a level of 51 mm and a 52 mm porous-coated pinnacle cup was hammered into place with 45 of lateral opening and 30 of anteversion.fluoroscopy was used to ensure this position of the cup.  Attention was turned towards the femur where  the leg was actually rotated, extended, and adduction did.  The femur was sequentially broached until a size of 12 broach gave a perfect fit and fill.at this point a  1.5 mm delta ceramic hip ball was placed and the hip reduced.  Fluoroscopic images were taken to assess the leg length, fit and fill of the stem, and cup position.  We were happy with the construct at this point.  The 12 broach was removed and a final Corrail stem with standard offset  and a 1.5 mm ceramic hip ball was placed and reduced.  Final images were taken to make certain there were happy with the position at this point.   The capsule was closed with #1 Vicryl suture.  The tensor fascia was closed with 0 Vicryl suture.  The skin was then closed with combination of 0 and 2-0 Vicryl suture.  The top layer was with 3-0 Monocryl suture.  Benzoin and Steri-Strips were applied  and a sterile compressive dressing was applied and the patient  taken to recovery room she noted be in satisfactory condition.  Past medical Motion for the procedure was approximately 300 cc.  Of note layer Herbie [place] she was with the entire case and assisted by retraction of tissues, manipulation of the leg, and closing the minimize or time.    Alta Corning 08/11/2020, 6:15 PM

## 2020-08-12 ENCOUNTER — Inpatient Hospital Stay (HOSPITAL_COMMUNITY): Payer: PPO

## 2020-08-12 LAB — CBC
HCT: 36.3 % (ref 36.0–46.0)
Hemoglobin: 12 g/dL (ref 12.0–15.0)
MCH: 29.1 pg (ref 26.0–34.0)
MCHC: 33.1 g/dL (ref 30.0–36.0)
MCV: 88.1 fL (ref 80.0–100.0)
Platelets: 338 10*3/uL (ref 150–400)
RBC: 4.12 MIL/uL (ref 3.87–5.11)
RDW: 13.8 % (ref 11.5–15.5)
WBC: 11.3 10*3/uL — ABNORMAL HIGH (ref 4.0–10.5)
nRBC: 0 % (ref 0.0–0.2)

## 2020-08-12 MED ORDER — CELECOXIB 100 MG PO CAPS: 100.0000 mg | ORAL_CAPSULE | Freq: Two times a day (BID) | ORAL | 0 refills | Status: AC

## 2020-08-12 MED ORDER — TIZANIDINE HCL 4 MG PO TABS: 4.0000 mg | ORAL_TABLET | Freq: Three times a day (TID) | ORAL | 0 refills | Status: DC | PRN

## 2020-08-12 NOTE — NC FL2 (Signed)
[place] LEVEL OF CARE SCREENING TOOL     IDENTIFICATION  Patient Name: Andrea Cantrell Birthdate: 1950/07/23 Sex: female Admission Date (Current Location): 08/09/2020  [facility] and [place] Number:  Herbalist and Address:  [facility],  [address] [place], [place]      Provider Number: [id number]  Attending Physician Name and Address:  Dorna Leitz, MD  Relative Name and Phone Number:  Geannie Risen (daughter) Ph: [phone]    Current Level of Care: Hospital Recommended Level of Care: [place] Prior Approval Number:    Date Approved/Denied:   PASRR Number: [id number]  Discharge Plan: SNF    Current Diagnoses: Patient Active Problem List   Diagnosis Date Noted   Primary osteoarthritis of right hip 08/09/2020   Right hip pain 02/26/2020   Palpitations 12/24/2019   Low back pain 04/13/2017   Paresthesia 03/05/2017   Incarcerated incisional hernia 12/09/2016   Tobacco abuse counseling 83/15/1761   Aortic systolic murmur on examination 06/17/2016   Lumbar radiculopathy 04/17/2014   Dizziness 04/13/2014   Acute bronchitis 04/13/2014   Hypoxia 04/12/2014   Left-sided low back pain with left-sided sciatica 03/26/2014   DOE (dyspnea on exertion) 11/20/2013   Essential hypertension    Hyperlipidemia with target LDL less than 130    Obesity (BMI 35.0-39.9 without comorbidity) ([place])    Multiple sclerosis ([place]) 07/26/2012   Other nonspecific abnormal result of function study of brain and central nervous system 07/26/2012   Disturbance of skin sensation 07/26/2012    Orientation RESPIRATION BLADDER Height & Weight     Self, Time, Situation, Place  Normal Continent Weight: 179 lb 0.2 oz (81.2 kg) Height:  5' 1.89" (157.2 cm)  BEHAVIORAL SYMPTOMS/MOOD NEUROLOGICAL BOWEL NUTRITION STATUS      Continent Diet (Regular diet)  AMBULATORY STATUS COMMUNICATION OF NEEDS Skin   Limited Assist Verbally Surgical wounds                        Personal Care Assistance Level of Assistance  Bathing, Feeding, Dressing Bathing Assistance: Limited assistance Feeding assistance: Independent Dressing Assistance: Limited assistance     Functional Limitations Info  Sight, Hearing, Speech Sight Info: Adequate Hearing Info: Adequate Speech Info: Adequate    SPECIAL CARE FACTORS FREQUENCY  PT (By licensed PT), OT (By licensed OT)     PT Frequency: 5x's/week OT Frequency: 5x's/week            Contractures Contractures Info: Not present    Additional Factors Info  Code Status, Allergies, Psychotropic Code Status Info: Full Allergies Info: NKA Psychotropic Info: Neurontin         Current Medications (08/12/2020):  This is the current hospital active medication list Current Facility-Administered Medications  Medication Dose Route Frequency Provider Last Rate Last Admin   0.9 %  sodium chloride infusion   Intravenous Continuous Gary Fleet, PA-C   Stopped at 08/10/20 1020   acetaminophen (TYLENOL) tablet 325-650 mg  325-650 mg Oral Q6H PRN Gary Fleet, PA-C   650 mg at 08/10/20 0630   albuterol (PROVENTIL) (2.5 MG/3ML) 0.083% nebulizer solution 3 mL  3 mL Inhalation Q4H PRN Gary Fleet, PA-C       alum & mag hydroxide-simeth (MAALOX/MYLANTA) 200-200-20 MG/5ML suspension 30 mL  30 mL Oral Q4H PRN Gary Fleet, PA-C   30 mL at 08/12/20 0330   amLODipine (NORVASC) tablet 5 mg  5 mg Oral q morning Gary Fleet, PA-C  5 mg at 08/12/20 1050   aspirin EC tablet 325 mg  325 mg Oral BID PC Gary Fleet, PA-C   325 mg at 08/12/20 0817   atorvastatin (LIPITOR) tablet 10 mg  10 mg Oral q morning Gary Fleet, PA-C   10 mg at 08/12/20 1050   baclofen (LIORESAL) tablet 10 mg  10 mg Oral QID Gary Fleet, PA-C   10 mg at 08/12/20 1354   bisacodyl (DULCOLAX) EC tablet 5 mg  5 mg Oral Daily PRN Gary Fleet, PA-C       Chlorhexidine Gluconate Cloth 2 % PADS 6 each  6 each Topical Daily Dorna Leitz, MD       diphenhydrAMINE (BENADRYL) 12.5 MG/5ML elixir 12.5-25 mg  12.5-25 mg Oral Q4H PRN Gary Fleet, PA-C       Diroximel Fumarate CPDR 462 mg  462 mg Oral BID Gary Fleet, PA-C   462 mg at 08/12/20 0820   docusate sodium (COLACE) capsule 100 mg  100 mg Oral BID Gary Fleet, PA-C   100 mg at 08/12/20 1050   gabapentin (NEURONTIN) capsule 400 mg  400 mg Oral QID Gary Fleet, PA-C   400 mg at 08/12/20 1354   HYDROmorphone (DILAUDID) injection 0.5-1 mg  0.5-1 mg Intravenous Q4H PRN Gary Fleet, PA-C   0.5 mg at 08/09/20 2008   magnesium citrate solution 1 Bottle  1 Bottle Oral Once PRN Gary Fleet, PA-C       menthol-cetylpyridinium (CEPACOL) lozenge 3 mg  1 lozenge Oral PRN Gary Fleet, PA-C       Or   phenol (CHLORASEPTIC) mouth spray 1 spray  1 spray Mouth/Throat PRN Gary Fleet, PA-C       methocarbamol (ROBAXIN) tablet 500 mg  500 mg Oral Q6H PRN Gary Fleet, PA-C   500 mg at 08/11/20 0308   Or   methocarbamol (ROBAXIN) 500 mg in dextrose 5 % 50 mL IVPB  500 mg Intravenous Q6H PRN Gary Fleet, PA-C       ondansetron [facility]) tablet 4 mg  4 mg Oral Q6H PRN Gary Fleet, PA-C       Or   ondansetron [facility]) injection 4 mg  4 mg Intravenous Q6H PRN Gary Fleet, PA-C       oxyCODONE (Oxy IR/ROXICODONE) immediate release tablet 5-10 mg  5-10 mg Oral Q4H PRN Gary Fleet, PA-C   10 mg at 08/12/20 1056   polyethylene glycol (MIRALAX / GLYCOLAX) packet 17 g  17 g Oral Daily PRN Gary Fleet, PA-C       predniSONE (STERAPRED UNI-PAK 21 TAB) tablet 10 mg  10 mg Oral 3 x daily with food Leighton Parody, PA-C   10 mg at 08/12/20 1354   [START ON 08/13/2020] predniSONE (STERAPRED UNI-PAK 21 TAB) tablet 10 mg  10 mg Oral 4X daily taper Leighton Parody, PA-C       predniSONE (STERAPRED UNI-PAK 21 TAB) tablet 20 mg  20 mg Oral Nightly Joanell Rising K, PA-C       valACYclovir (VALTREX) tablet 500 mg  500 mg Oral Daily Gary Fleet, PA-C   500 mg at 08/12/20  1049   zolpidem (AMBIEN) tablet 5 mg  5 mg Oral QHS PRN Gary Fleet, PA-C         Discharge Medications: Please see discharge summary for a list of discharge medications.  Relevant Imaging Results:  Relevant Lab Results:   Additional Information SSN: [id number]  Sherie Don, LCSW

## 2020-08-12 NOTE — Progress Notes (Signed)
Physical Therapy Treatment Patient Details Name: Andrea Cantrell MRN: [id number] DOB: 08-03-1950 Today's Date: 08/12/2020    History of Present Illness 70 yo female s/p R DA THA. PMH: MS, RCR, HTN    PT Comments    Pt progressing. MD has ordered AFO for RLE. Spoke to pt about skin checks with AFO. Continues to have numbness and weakness, pf improved from yesterday   Follow Up Recommendations  Follow surgeon's recommendation for DC plan and follow-up therapies;SNF     Equipment Recommendations  Rolling walker with 5" wheels;3in1 (PT)    Recommendations for Other Services       Precautions / Restrictions Precautions Precautions: Fall Precaution Comments: 0 to 1/5 df R ankle since surgery    Mobility  Bed Mobility Overal bed mobility: Needs Assistance Bed Mobility: Sit to Supine       Sit to supine: Min assist;Mod assist   General bed mobility comments: assist with LEs, incr time and effort, pt able to use gait belt as leg lifter. cues for technique    Transfers Overall transfer level: Needs assistance Equipment used: Rolling walker (2 wheeled) Transfers: Sit to/from Stand Sit to Stand: Min guard (from toilet and bed)         General transfer comment: cues for hand placement and RLE position.  Ambulation/Gait Ambulation/Gait assistance: Min assist;Min guard Gait Distance (Feet): 12 Feet (x2) Assistive device: Rolling walker (2 wheeled) Gait Pattern/deviations: Step-to pattern;Decreased stance time - right;Decreased dorsiflexion - right Gait velocity: decr   General Gait Details: cues for sequence, RW position and safety   Stairs             Wheelchair Mobility    Modified Rankin (Stroke Patients Only)       Balance             Standing balance-Leahy Scale: Poor Standing balance comment: heavily reliant on UEs                            Cognition Arousal/Alertness: Awake/alert Behavior During Therapy: WFL for tasks  assessed/performed;Anxious Overall Cognitive Status: Within Functional Limits for tasks assessed                                        Exercises Total Joint Exercises Ankle Circles/Pumps: AROM;PROM;Both;10 reps    General Comments        Pertinent Vitals/Pain Pain Assessment: 0-10 Pain Score: 4  Pain Location: R hip Pain Descriptors / Indicators: Sore;Burning Pain Intervention(s): Limited activity within patient's tolerance;Monitored during session;Ice applied;Repositioned;RN gave pain meds during session    Home Living                      Prior Function            PT Goals (current goals can now be found in the care plan section) Acute Rehab PT Goals Patient Stated Goal: have less pain PT Goal Formulation: With patient Time For Goal Achievement: 08/23/20 Potential to Achieve Goals: Good Progress towards PT goals: Progressing toward goals    Frequency    7X/week      PT Plan Current plan remains appropriate    Co-evaluation              AM-PAC PT "6 Clicks" Mobility   Outcome Measure  Help needed turning from your back  to your side while in a flat bed without using bedrails?: A Lot Help needed moving from lying on your back to sitting on the side of a flat bed without using bedrails?: A Lot Help needed moving to and from a bed to a chair (including a wheelchair)?: A Little Help needed standing up from a chair using your arms (e.g., wheelchair or bedside chair)?: A Little Help needed to walk in hospital room?: A Little Help needed climbing 3-5 steps with a railing? : A Lot 6 Click Score: 15    End of Session Equipment Utilized During Treatment: Gait belt Activity Tolerance: Patient tolerated treatment well Patient left: with call bell/phone within reach;in chair Nurse Communication: Mobility status PT Visit Diagnosis: Other abnormalities of gait and mobility (R26.89);Difficulty in walking, not elsewhere classified (R26.2)      Time: 9371-6967 PT Time Calculation (min) (ACUTE ONLY): 20 min  Charges:  $Gait Training: 8-22 mins                     Baxter Flattery, PT  Acute Rehab Dept ([place]) [phone] Pager [phone]  08/12/2020    [facility] 08/12/2020, 11:19 AM

## 2020-08-12 NOTE — Progress Notes (Signed)
Subjective: 3 Days Post-Op Procedure(s) (LRB): TOTAL HIP ARTHROPLASTY ANTERIOR APPROACH (Right) Patient reports pain as 1 on 0-10 scale, 2 on 0-10 scale, 3 on 0-10 scale, 4 on 0-10 scale, 5 on 0-10 scale, 6 on 0-10 scale, 7 on 0-10 scale, 8 on 0-10 scale, 9 on 0-10 scale, 10 on 0-10 scale, mild, and Moderate to severe .  She continues to have foot drop and dense numbness in the one-two interspace.  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  She has been able to walk although certainly not well enough to be at home at this point.  Objective: Vital signs in last 24 hours: Temp:  [98.3 F (36.8 C)-98.7 F (37.1 C)] 98.3 F (36.8 C) (06/27 0540) Pulse Rate:  [80-92] 80 (06/27 0540) Resp:  [16] 16 (06/27 0540) BP: (111-120)/(53-68) 120/53 (06/27 0540) SpO2:  [92 %-96 %] 92 % (06/27 0540)  Intake/Output from previous day: 06/26 0701 - 06/27 0700 In: 780 [P.O.:780] Out: 450 [Urine:450] Intake/Output this shift: Total I/O In: 240 [P.O.:240] Out: 0   Recent Labs    08/10/20 0351 08/11/20 0256 08/12/20 0303  HGB 11.5* 13.5 12.0   Recent Labs    08/11/20 0256 08/12/20 0303  WBC 9.9 11.3*  RBC 4.55 4.12  HCT 41.0 36.3  PLT 335 338   No results for input(s): NA, K, CL, CO2, BUN, CREATININE, GLUCOSE, CALCIUM in the last 72 hours. No results for input(s): LABPT, INR in the last 72 hours.  ABD soft.  Right thigh has moderate soft tissue swelling but certainly not dramatic.  There is no erythema or warmth.  There is a negative Tinel's at the knee laterally.  She has inability to dorsiflex the right foot.  She has ability to flex and extend the knee and that seems improved today from yesterday.   Assessment/Plan: 3 Days Post-Op Procedure(s) (LRB): TOTAL HIP ARTHROPLASTY ANTERIOR APPROACH (Right) Advance diet Up with therapy Discharge to SNF  At this point I will get a CT scan of the right hip just to make sure there is not a hematoma or some other issue around the sciatic  nerve.  I think this is unlikely but I think we should rule it out.  I am going to put her in an AFO to help with her walking. I had a long discussion with Dr. Malen Gauze the neurologist today.  He was describing that some people with MS can have increased risk for neurologic complications of their nerves.  We talked about the other potentials which would be some transient issue from spinal anesthetic versus some transient issue from Xperel but both of those things should be getting better by this point.We talked about EMG nerve conduction but that would not be of any benefit until a minimum of 6 weeks.  He was in agreement that treating her with an AFO symptomatically and following her clinically as he only reasonable course of action.  I think given that she is having such difficulty with walking I think she needs to go to skilled nursing facility for probably a week to really get comfortable with walking and doing stairs.  At that point she should be able to go home.  We will still try for discharge to sniff today if we get the information from her CAT scan and she is fitted with an AFO.      Andrea Cantrell 08/12/2020, 12:00 PM

## 2020-08-12 NOTE — Progress Notes (Signed)
Physical Therapy Treatment Patient Details Name: Andrea Cantrell MRN: [id number] DOB: 1950-04-14 Today's Date: 08/12/2020    History of Present Illness 70 yo female s/p R DA THA. PMH: MS, RCR, HTN    PT Comments    Pt continues to  exhibit RLE weakness, especially ankle df, pf is improving.  Pt is being fitted for AFO however R ankle/foot are swollen as is normal after surgery. Pt may need larger shoe temporarily to fit AFO, discussed with pt. Continue to recommend SNF.   Follow Up Recommendations  Follow surgeon's recommendation for DC plan and follow-up therapies;SNF     Equipment Recommendations  Rolling walker with 5" wheels;3in1 (PT)    Recommendations for Other Services       Precautions / Restrictions Precautions Precautions: Fall Precaution Comments: 0 to 1/5 df R ankle since surgery Restrictions RLE Weight Bearing: Weight bearing as tolerated    Mobility  Bed Mobility Overal bed mobility: Needs Assistance Bed Mobility: Sit to Supine       Sit to supine: Min assist;Min guard   General bed mobility comments: assist with LEs, incr time and effort, pt able to use gait belt as leg lifter. cues for technique    Transfers Overall transfer level: Needs assistance Equipment used: Rolling walker (2 wheeled) Transfers: Sit to/from Stand Sit to Stand: Min guard         General transfer comment: cues for hand placement and RLE position.  Ambulation/Gait Ambulation/Gait assistance: Min assist;Min guard Gait Distance (Feet): 40 Feet Assistive device: Rolling walker (2 wheeled) Gait Pattern/deviations: Step-to pattern;Decreased stance time - right;Decreased dorsiflexion - right Gait velocity: decr   General Gait Details: cues for sequence, RW position and safety. slight steppage gait d/t R drop foot   Stairs             Wheelchair Mobility    Modified Rankin (Stroke Patients Only)       Balance             Standing balance-Leahy Scale:  Poor                              Cognition Arousal/Alertness: Awake/alert Behavior During Therapy: WFL for tasks assessed/performed;Anxious Overall Cognitive Status: Within Functional Limits for tasks assessed                                        Exercises Total Joint Exercises Ankle Circles/Pumps: AROM;PROM;Both;10 reps Quad Sets: AROM;Both;10 reps Gluteal Sets: Both;10 reps;Strengthening Short Arc Quad: AAROM;Right;AROM;10 reps Heel Slides: AAROM;Right;10 reps    General Comments        Pertinent Vitals/Pain Pain Assessment: 0-10 Pain Score: 3  Pain Location: R hip Pain Descriptors / Indicators: Sore;Burning;Tightness Pain Intervention(s): Limited activity within patient's tolerance;Monitored during session    Home Living                      Prior Function            PT Goals (current goals can now be found in the care plan section) Acute Rehab PT Goals Patient Stated Goal: have less pain PT Goal Formulation: With patient Time For Goal Achievement: 08/23/20 Potential to Achieve Goals: Good Progress towards PT goals: Progressing toward goals    Frequency    7X/week      PT Plan Current  plan remains appropriate    Co-evaluation              AM-PAC PT "6 Clicks" Mobility   Outcome Measure  Help needed turning from your back to your side while in a flat bed without using bedrails?: A Lot Help needed moving from lying on your back to sitting on the side of a flat bed without using bedrails?: A Lot Help needed moving to and from a bed to a chair (including a wheelchair)?: A Little Help needed standing up from a chair using your arms (e.g., wheelchair or bedside chair)?: A Little Help needed to walk in hospital room?: A Little Help needed climbing 3-5 steps with a railing? : A Lot 6 Click Score: 15    End of Session Equipment Utilized During Treatment: Gait belt Activity Tolerance: Patient tolerated  treatment well Patient left: with call bell/phone within reach;in chair Nurse Communication: Mobility status PT Visit Diagnosis: Other abnormalities of gait and mobility (R26.89);Difficulty in walking, not elsewhere classified (R26.2)     Time: 1533-1600 PT Time Calculation (min) (ACUTE ONLY): 27 min  Charges:  $Gait Training: 8-22 mins $Therapeutic Exercise: 8-22 mins                     Baxter Flattery, PT  Acute Rehab Dept ([place]) [phone] Pager [phone]  08/12/2020    [facility] 08/12/2020, 4:06 PM

## 2020-08-12 NOTE — TOC Progression Note (Signed)
Transition of Care [facility]) - Progression Note   Patient Details  Name: Andrea Cantrell MRN: [id number] Date of Birth: 06-02-1950  Transition of Care [facility]) CM/SW [place], LCSW Phone Number: 08/12/2020, 2:19 PM  Clinical Narrative: Per orthopedist and PT, patient is now being recommended for SNF. Patient is agreeable to being faxed out for SNF and has been vaccinated and boosted x1 for COVID. FL2 done; PASRR received. Initial referral faxed out in hub. CSW called [facility] and spoke with Tammy to start insurance authorization. TOC awaiting bed offers.  Expected Discharge Plan: Skilled Nursing Facility Barriers to Discharge: No Barriers Identified  Expected Discharge Plan and Services Expected Discharge Plan: [place] Discharge Planning Services: CM Consult Post Acute Care Choice: [place] Living arrangements for the past 2 months: Single Family Home Expected Discharge Date: 08/12/20               DME Arranged: 3-N-1 DME Agency: Other - Comment [facility]) Date DME Agency Contacted: 08/10/20 Time DME Agency Contacted: 1059 Representative spoke with at DME Agency: [place]: PT [place]: [place] Representative spoke with at [place]: pre-arranged in md office  Readmission Risk Interventions No flowsheet data found.

## 2020-08-12 NOTE — Progress Notes (Signed)
Orthopedic Tech Progress Note Patient Details:  Andrea Cantrell [birth year] [id number]  Patient ID: Andrea Cantrell, female   DOB: 1950/06/08, 70 y.o.   MRN: [id number]  Andrea Cantrell 08/12/2020, 9:41 AM Afo ordered [facility]

## 2020-08-12 NOTE — Plan of Care (Signed)
Plan of care reviewed and discussed with the patient. 

## 2020-08-12 NOTE — Discharge Summary (Addendum)
Physician Discharge Summary  Patient ID: Andrea Cantrell MRN: [id number] DOB/AGE: February 23, 1950 70 y.o.  Admit date: 08/09/2020 Discharge date: 08/13/2020  Admission Diagnoses:  Discharge Diagnoses:  Principal Problem:   Primary osteoarthritis of right hip   Discharged Condition: good  Hospital Course: HD4 s/p right total hip replacement  Consults: None  Significant Diagnostic Studies: None  Treatments: IV hydration, antibiotics, and surgery: Right total hip arthroplasty  Discharge Exam: Blood pressure (!) 120/53, pulse 80, temperature 98.3 F (36.8 C), temperature source Oral, resp. rate 16, height 5' 1.89" (1.572 m), weight 81.2 kg, SpO2 92 %. General appearance: alert, cooperative, appears stated age, and no distress Head: Normocephalic, without obvious abnormality, atraumatic Eyes: negative findings: lids and lashes normal Neck: supple, symmetrical, trachea midline and thyroid not enlarged, symmetric, no tenderness/mass/nodules Resp: No retractions Extremities: extremities normal, atraumatic, no cyanosis or edema Incision/Wound: C/D/I  Disposition: Discharge disposition: 03-Skilled Nursing Facility       Discharge Instructions     Call MD for:  difficulty breathing, headache or visual disturbances   Complete by: As directed    Call MD for:  persistant nausea and vomiting   Complete by: As directed    Call MD for:  severe uncontrolled pain   Complete by: As directed    Call MD for:  temperature >100.4   Complete by: As directed    Diet - low sodium heart healthy   Complete by: As directed    Discharge instructions   Complete by: As directed    INSTRUCTIONS AFTER JOINT REPLACEMENT   Remove items at home which could result in a fall. This includes throw rugs or furniture in walking pathways ICE to the affected joint every three hours while awake for 30 minutes at a time, for at least the first 3-5 days, and then as needed for pain and swelling.  Continue to use  ice for pain and swelling. You may notice swelling that will progress down to the foot and ankle.  This is normal after surgery.  Elevate your leg when you are not up walking on it.   Continue to use the breathing machine you got in the hospital (incentive spirometer) which will help keep your temperature down.  It is common for your temperature to cycle up and down following surgery, especially at night when you are not up moving around and exerting yourself.  The breathing machine keeps your lungs expanded and your temperature down.   DIET:  As you were doing prior to hospitalization, we recommend a well-balanced diet.  DRESSING / WOUND CARE / SHOWERING  Keep the surgical dressing until follow up.  The dressing is water proof, so you can shower without any extra covering.  IF THE DRESSING FALLS OFF or the wound gets wet inside, change the dressing with sterile gauze.  Please use good hand washing techniques before changing the dressing.  Do not use any lotions or creams on the incision until instructed by your surgeon.    ACTIVITY  Increase activity slowly as tolerated, but follow the weight bearing instructions below.   No driving for 6 weeks or until further direction given by your physician.  You cannot drive while taking narcotics.  No lifting or carrying greater than 10 lbs. until further directed by your surgeon. Avoid periods of inactivity such as sitting longer than an hour when not asleep. This helps prevent blood clots.  You may return to work once you are authorized by your doctor.  WEIGHT BEARING   Weight bearing as tolerated with assist device (walker, cane, etc) as directed, use it as long as suggested by your surgeon or therapist, typically at least 4-6 weeks.   EXERCISES  Results after joint replacement surgery are often greatly improved when you follow the exercise, range of motion and muscle strengthening exercises prescribed by your doctor. Safety measures are also  important to protect the joint from further injury. Any time any of these exercises cause you to have increased pain or swelling, decrease what you are doing until you are comfortable again and then slowly increase them. If you have problems or questions, call your caregiver or physical therapist for advice.   Rehabilitation is important following a joint replacement. After just a few days of immobilization, the muscles of the leg can become weakened and shrink (atrophy).  These exercises are designed to build up the tone and strength of the thigh and leg muscles and to improve motion. Often times heat used for twenty to thirty minutes before working out will loosen up your tissues and help with improving the range of motion but do not use heat for the first two weeks following surgery (sometimes heat can increase post-operative swelling).   These exercises can be done on a training (exercise) mat, on the floor, on a table or on a bed. Use whatever works the best and is most comfortable for you.    Use music or television while you are exercising so that the exercises are a pleasant break in your day. This will make your life better with the exercises acting as a break in your routine that you can look forward to.   Perform all exercises about fifteen times, three times per day or as directed.  You should exercise both the operative leg and the other leg as well.  Exercises include:   Quad Sets - Tighten up the muscle on the front of the thigh (Quad) and hold for 5-10 seconds.   Straight Leg Raises - With your knee straight (if you were given a brace, keep it on), lift the leg to 60 degrees, hold for 3 seconds, and slowly lower the leg.  Perform this exercise against resistance later as your leg gets stronger.  Leg Slides: Lying on your back, slowly slide your foot toward your buttocks, bending your knee up off the floor (only go as far as is comfortable). Then slowly slide your foot back down until your  leg is flat on the floor again.  Angel Wings: Lying on your back spread your legs to the side as far apart as you can without causing discomfort.  Hamstring Strength:  Lying on your back, push your heel against the floor with your leg straight by tightening up the muscles of your buttocks.  Repeat, but this time bend your knee to a comfortable angle, and push your heel against the floor.  You may put a pillow under the heel to make it more comfortable if necessary.   A rehabilitation program following joint replacement surgery can speed recovery and prevent re-injury in the future due to weakened muscles. Contact your doctor or a physical therapist for more information on knee rehabilitation.    CONSTIPATION  Constipation is defined medically as fewer than three stools per week and severe constipation as less than one stool per week.  Even if you have a regular bowel pattern at home, your normal regimen is likely to be disrupted due to multiple reasons following surgery.  Combination of anesthesia, postoperative narcotics, change in appetite and fluid intake all can affect your bowels.   YOU MUST use at least one of the following options; they are listed in order of increasing strength to get the job done.  They are all available over the counter, and you may need to use some, POSSIBLY even all of these options:    Drink plenty of fluids (prune juice may be helpful) and high fiber foods Colace 100 mg by mouth twice a day  Senokot for constipation as directed and as needed Dulcolax (bisacodyl), take with full glass of water  Miralax (polyethylene glycol) once or twice a day as needed.  If you have tried all these things and are unable to have a bowel movement in the first 3-4 days after surgery call either your surgeon or your primary doctor.    If you experience loose stools or diarrhea, hold the medications until you stool forms back up.  If your symptoms do not get better within 1 week or if  they get worse, check with your doctor.  If you experience "the worst abdominal pain ever" or develop nausea or vomiting, please contact the office immediately for further recommendations for treatment.   ITCHING:  If you experience itching with your medications, try taking only a single pain pill, or even half a pain pill at a time.  You can also use Benadryl over the counter for itching or also to help with sleep.   TED HOSE STOCKINGS:  Use stockings on both legs until for at least 2 weeks or as directed by physician office. They may be removed at night for sleeping.  MEDICATIONS:  See your medication summary on the "After Visit Summary" that nursing will review with you.  You may have some home medications which will be placed on hold until you complete the course of blood thinner medication.  It is important for you to complete the blood thinner medication as prescribed.  PRECAUTIONS:  If you experience chest pain or shortness of breath - call 911 immediately for transfer to the hospital emergency department.   If you develop a fever greater that 101 F, purulent drainage from wound, increased redness or drainage from wound, foul odor from the wound/dressing, or calf pain - CONTACT YOUR SURGEON.                                                   FOLLOW-UP APPOINTMENTS:  If you do not already have a post-op appointment, please call the office for an appointment to be seen by your surgeon.  Guidelines for how soon to be seen are listed in your "After Visit Summary", but are typically between 1-4 weeks after surgery.  OTHER INSTRUCTIONS:   Knee Replacement:  Do not place pillow under knee, focus on keeping the knee straight while resting. CPM instructions: 0-90 degrees, 2 hours in the morning, 2 hours in the afternoon, and 2 hours in the evening. Place foam block, curve side up under heel at all times except when in CPM or when walking.  DO NOT modify, tear, cut, or change the foam block in any  way.  POST-OPERATIVE OPIOID TAPER INSTRUCTIONS: It is important to wean off of your opioid medication as soon as possible. If you do not need pain medication after your surgery it is ok to  stop day one. Opioids include: Codeine, Hydrocodone(Norco, Vicodin), Oxycodone(Percocet, oxycontin) and hydromorphone amongst others.  Long term and even short term use of opiods can cause: Increased pain response Dependence Constipation Depression Respiratory depression And more.  Withdrawal symptoms can include Flu like symptoms Nausea, vomiting And more Techniques to manage these symptoms Hydrate well Eat regular healthy meals Stay active Use relaxation techniques(deep breathing, meditating, yoga) Do Not substitute Alcohol to help with tapering If you have been on opioids for less than two weeks and do not have pain than it is ok to stop all together.  Plan to wean off of opioids This plan should start within one week post op of your joint replacement. Maintain the same interval or time between taking each dose and first decrease the dose.  Cut the total daily intake of opioids by one tablet each day Next start to increase the time between doses. The last dose that should be eliminated is the evening dose.   Dental Antibiotics:  In most cases prophylactic antibiotics for Dental procdeures after total joint surgery are not necessary.  Exceptions are as follows:  1. History of prior total joint infection  2. Severely immunocompromised (Organ Transplant, cancer chemotherapy, Rheumatoid biologic meds such as [place])  3. Poorly controlled diabetes (A1C &gt; 8.0, blood glucose over 200)  If you have one of these conditions, contact your surgeon for an antibiotic prescription, prior to your dental procedure.    MAKE SURE YOU:  Understand these instructions.  Get help right away if you are not doing well or get worse.    Thank you for letting us be a part of your medical care team.   It is a privilege we respect greatly.  We hope these instructions will help you stay on track for a fast and full recovery!   Increase activity slowly   Complete by: As directed    Leave dressing on - Keep it clean, dry, and intact until clinic visit   Complete by: As directed       Allergies as of 08/12/2020   No Known Allergies      Medication List     STOP taking these medications    ibuprofen 200 MG tablet Commonly known as: ADVIL   traMADol 50 MG tablet Commonly known as: ULTRAM       TAKE these medications    albuterol 108 (90 Base) MCG/ACT inhaler Commonly known as: VENTOLIN HFA Inhale 2 puffs into the lungs every 4 (four) hours as needed for wheezing or shortness of breath. For shortness of breath.   amLODipine 5 MG tablet Commonly known as: NORVASC Take 5 mg by mouth every morning.   aspirin EC 325 MG tablet Take 1 tablet (325 mg total) by mouth 2 (two) times daily after a meal. Take x 1 month post op to decrease risk of blood clots. What changed:  medication strength how much to take when to take this additional instructions   atorvastatin 10 MG tablet Commonly known as: LIPITOR Take 10 mg by mouth every morning.   baclofen 10 MG tablet Commonly known as: LIORESAL Take 1 tablet (10 mg total) by mouth 4 (four) times daily.   CALCIUM 600 PO Take 600 mg by mouth in the morning and at bedtime.   celecoxib 100 MG capsule Commonly known as: CeleBREX Take 1 capsule (100 mg total) by mouth 2 (two) times daily.   cholecalciferol 25 MCG (1000 UNIT) tablet Commonly known as: VITAMIN D Take 1,000 Units by  mouth daily.   diphenhydramine-acetaminophen 25-500 MG Tabs tablet Commonly known as: TYLENOL PM Take 1 tablet by mouth at bedtime.   docusate sodium 100 MG capsule Commonly known as: Colace Take 1 capsule (100 mg total) by mouth 2 (two) times daily.   gabapentin 400 MG capsule Commonly known as: NEURONTIN Take 1 capsule (400 mg total) by mouth  4 (four) times daily.   loratadine 10 MG tablet Commonly known as: CLARITIN Take 10 mg by mouth daily as needed for allergies.   oxyCODONE-acetaminophen 5-325 MG tablet Commonly known as: PERCOCET/ROXICET Take 1-2 tablets by mouth every 6 (six) hours as needed for severe pain.   polyethylene glycol 17 g packet Commonly known as: MIRALAX / GLYCOLAX Uses as needed for constipation at home.  He can buy this over-the-counter at any drugstore.  Follow package instructions. What changed:  how much to take how to take this when to take this reasons to take this additional instructions   pseudoephedrine-guaifenesin 60-600 MG 12 hr tablet Commonly known as: MUCINEX D Take 1 tablet by mouth 2 (two) times daily as needed for congestion.   tiZANidine 4 MG tablet Commonly known as: Zanaflex Take 1 tablet (4 mg total) by mouth every 8 (eight) hours as needed for muscle spasms.   valACYclovir 500 MG tablet Commonly known as: VALTREX Take 500 mg by mouth daily.   Vumerity 231 MG Cpdr Generic drug: Diroximel Fumarate Take 462 mg by mouth 2 (two) times daily.               Durable Medical Equipment  (From admission, onward)           Start     Ordered   08/09/20 1726  DME Walker rolling  Once       Question:  Patient needs a walker to treat with the following condition  Answer:  Primary osteoarthritis of right hip   08/09/20 1725   08/09/20 1726  DME 3 n 1  Once        08/09/20 1725              Discharge Care Instructions  (From admission, onward)           Start     Ordered   08/12/20 0000  Leave dressing on - Keep it clean, dry, and intact until clinic visit        08/12/20 1316            Follow-up Information     Dorna Leitz, MD. Go on 08/22/2020.   Specialty: Orthopedic Surgery Why: Your appointment is scheduled for 10:15. Contact information: [place] [address] [phone]         [facility],  [place]. Go on 08/26/2020.   Why: Your outpatient physical therapy will start after your appointment with Dr Berenice Primas if you need to continue therapy. The office will call you with a time Contact information: [place] [address] [place], [place] Follow up.   Specialty: Home Health Services Why: HHPT will provide 5 visits prior to you starting outpatient physical therapy. Contact information: [address] [place] [address] [phone]                 Signed: Dereck Leep 08/13/2020

## 2020-08-12 NOTE — Care Plan (Signed)
Following for progress. Events of the weekend noted. Spoke with CSW after reading MD note that patient now needs SNF.  She is [employer] and will need prior approval. CSW will begin process and keep me in the loop for options.    Ladell Heads, [place]

## 2020-08-12 NOTE — Plan of Care (Signed)
  Problem: Coping: Goal: Level of anxiety will decrease Outcome: Progressing   Problem: Pain Managment: Goal: General experience of comfort will improve Outcome: Progressing   

## 2020-08-13 DIAGNOSIS — R5381 Other malaise: Secondary | ICD-10-CM | POA: Diagnosis not present

## 2020-08-13 DIAGNOSIS — Z7401 Bed confinement status: Secondary | ICD-10-CM | POA: Diagnosis not present

## 2020-08-13 DIAGNOSIS — I358 Other nonrheumatic aortic valve disorders: Secondary | ICD-10-CM | POA: Diagnosis not present

## 2020-08-13 DIAGNOSIS — I10 Essential (primary) hypertension: Secondary | ICD-10-CM | POA: Diagnosis not present

## 2020-08-13 DIAGNOSIS — R41841 Cognitive communication deficit: Secondary | ICD-10-CM | POA: Diagnosis not present

## 2020-08-13 DIAGNOSIS — M1611 Unilateral primary osteoarthritis, right hip: Secondary | ICD-10-CM | POA: Diagnosis not present

## 2020-08-13 DIAGNOSIS — Z9889 Other specified postprocedural states: Secondary | ICD-10-CM | POA: Diagnosis not present

## 2020-08-13 DIAGNOSIS — R2681 Unsteadiness on feet: Secondary | ICD-10-CM | POA: Diagnosis not present

## 2020-08-13 DIAGNOSIS — G8911 Acute pain due to trauma: Secondary | ICD-10-CM | POA: Diagnosis not present

## 2020-08-13 DIAGNOSIS — E669 Obesity, unspecified: Secondary | ICD-10-CM | POA: Diagnosis not present

## 2020-08-13 DIAGNOSIS — E785 Hyperlipidemia, unspecified: Secondary | ICD-10-CM | POA: Diagnosis not present

## 2020-08-13 DIAGNOSIS — M5442 Lumbago with sciatica, left side: Secondary | ICD-10-CM | POA: Diagnosis not present

## 2020-08-13 DIAGNOSIS — Z720 Tobacco use: Secondary | ICD-10-CM | POA: Diagnosis not present

## 2020-08-13 DIAGNOSIS — Z96641 Presence of right artificial hip joint: Secondary | ICD-10-CM | POA: Diagnosis not present

## 2020-08-13 DIAGNOSIS — G35 Multiple sclerosis: Secondary | ICD-10-CM | POA: Diagnosis not present

## 2020-08-13 DIAGNOSIS — M5416 Radiculopathy, lumbar region: Secondary | ICD-10-CM | POA: Diagnosis not present

## 2020-08-13 DIAGNOSIS — M545 Low back pain, unspecified: Secondary | ICD-10-CM | POA: Diagnosis not present

## 2020-08-13 DIAGNOSIS — L930 Discoid lupus erythematosus: Secondary | ICD-10-CM | POA: Diagnosis not present

## 2020-08-13 DIAGNOSIS — R262 Difficulty in walking, not elsewhere classified: Secondary | ICD-10-CM | POA: Diagnosis not present

## 2020-08-13 DIAGNOSIS — Z471 Aftercare following joint replacement surgery: Secondary | ICD-10-CM | POA: Diagnosis not present

## 2020-08-13 DIAGNOSIS — M6281 Muscle weakness (generalized): Secondary | ICD-10-CM | POA: Diagnosis not present

## 2020-08-13 LAB — RESP PANEL BY RT-PCR (FLU A&B, COVID) ARPGX2
Influenza A by PCR: NEGATIVE
Influenza B by PCR: NEGATIVE
SARS Coronavirus 2 by RT PCR: NEGATIVE

## 2020-08-13 NOTE — Progress Notes (Signed)
Report called to [facility]

## 2020-08-13 NOTE — TOC Transition Note (Signed)
Transition of Care [facility]) - CM/SW Discharge Note  Patient Details  Name: Andrea Cantrell MRN: [id number] Date of Birth: 12-24-1950  [facility] CM/SW Contact:  Sherie Don, LCSW Phone Number: 08/13/2020, 1:28 PM  Clinical Narrative: CSW provided patient with bed offers. Patient selected [facility]. CSW spoke with Lexine Baton with [facility] to confirm bed availability. [facility] will accept the patient today and will allow patient to come with a pending COVID test. CSW called Tammy with HTA to finish insurance authorization. Patient has been approved for 7 days. SNF authorization is [id number] and EMS authorization is [id number]. Discharge summary, discharge orders, and SNF transfer report faxed to facility in hub. Medical necessity form done; PTAR scheduled. Discharge packet completed. RN updated. Family notified of transfer. TOC signing off.  Final next level of care: Skilled Nursing Facility Barriers to Discharge: No Barriers Identified  Patient Goals and CMS Choice Patient states their goals for this hospitalization and ongoing recovery are:: Discharge to rehab CMS Medicare.gov Compare Post Acute Care list provided to:: Patient Choice offered to / list presented to : Patient  Discharge Placement PASRR number recieved: 08/12/20        Patient chooses bed at: [facility] Patient to be transferred to facility by: [facility] Name of family member notified: Lelon Frohlich (daughter) Patient and family notified of of transfer: 08/13/20  Discharge Plan and Services Discharge Planning Services: CM Consult Post Acute Care Choice: [facility]          DME Arranged: 3-N-1 DME Agency: Other - Comment [facility]) Date DME Agency Contacted: 08/10/20 Time DME Agency Contacted: 1059 Representative spoke with at DME Agency: [facility] HH Arranged: PT [place]: [facility] Representative spoke with at [place]: pre-arranged in md office  Readmission Risk Interventions No  flowsheet data found.

## 2020-08-13 NOTE — Progress Notes (Signed)
Subjective: 4 Days Post-Op Procedure(s) (LRB): TOTAL HIP ARTHROPLASTY ANTERIOR APPROACH (Right) Patient reports pain as moderate.    Objective: Vital signs in last 24 hours: Temp:  [97.9 F (36.6 C)-98.3 F (36.8 C)] 97.9 F (36.6 C) (06/28 0534) Pulse Rate:  [61-84] 61 (06/28 0534) Resp:  [15-16] 16 (06/28 0534) BP: (120-132)/(52-66) 124/52 (06/28 0534) SpO2:  [97 %-100 %] 100 % (06/28 0534)  Intake/Output from previous day: 06/27 0701 - 06/28 0700 In: 1370 [P.O.:1370] Out: 0  Intake/Output this shift: Total I/O In: 150 [P.O.:150] Out: -   Recent Labs    08/11/20 0256 08/12/20 0303  HGB 13.5 12.0   Recent Labs    08/11/20 0256 08/12/20 0303  WBC 9.9 11.3*  RBC 4.55 4.12  HCT 41.0 36.3  PLT 335 338   No results for input(s): NA, K, CL, CO2, BUN, CREATININE, GLUCOSE, CALCIUM in the last 72 hours. No results for input(s): LABPT, INR in the last 72 hours.  ABD soft Intact pulses distally No cellulitis present Compartment soft Patient persists with no significant function of her EHL or anterior tib.  She essentially has a foot drop.  I do believe she has better power and push down today.  I do believe her quad and hamstring are firing a bit better today.   Assessment/Plan: 4 Days Post-Op Procedure(s) (LRB): TOTAL HIP ARTHROPLASTY ANTERIOR APPROACH (Right) Advance diet Up with therapy Discharge to SNF I am hopeful with the AFO and continued improvement that the patient will be able to be discharged home within 7-10 days.  I will talk with her neurologist for later today or tomorrow about her findings from EMG nerve conduction studies done prior to her surgery.  We will continue her on just aspirin at this point given that she is mobilizing reasonably well.   Anticipated LOS equal to or greater than 2 midnights due to - Age 10 and older with one or more of the following:  - Obesity  - Expected need for hospital services (PT, OT, Nursing) required for safe   discharge  - Anticipated need for postoperative skilled nursing care or inpatient rehab  - Active co-morbidities: Chronic pain requiring opiods and Postoperative worsening of a pre-existing nerve issue which makes it difficult for the patient to stand and walk. OR   - Unanticipated findings during/Post Surgery: Foot drop for unknown reasons but could be related to spinal stenosis, MS, or peroneal nerve injury for unknown reasons.   - Patient is a high risk of re-admission due to: Postoperative nerve issue which makes it very difficult for the patient to stand and walk.   Alta Corning 08/13/2020, 10:10 AM

## 2020-08-13 NOTE — Progress Notes (Signed)
Physical Therapy Treatment Patient Details Name: Andrea Cantrell MRN: [id number] DOB: March 12, 1950 Today's Date: 08/13/2020    History of Present Illness 70 yo female s/p R DA THA. PMH: MS, RCR, HTN    PT Comments    POD # 4 First performe3d some THR TE's while supine in bed.  Assisted OOB.  General bed mobility comments: demonstarted and instructed how to muse belt to self assist.  Applied AFO using daughters lace up sneaker as pt's light weight casual did not fit.  Instructed on proper donning and educated on skin check after use.  Also instructed to wear knee high sock or TEDS to protect skin.  Assisted with amb in hallway.  General Gait Details: cues for sequence, RW position and safety. Amb with R AFO for first time, instructed on increasing slightly hip hick.   Pt plans to D/C to SNF today.   Follow Up Recommendations  SNF     Equipment Recommendations  Rolling walker with 5" wheels;3in1 (PT)    Recommendations for Other Services       Precautions / Restrictions Precautions Precautions: Fall Precaution Comments: R foot drop/R AFO Restrictions Weight Bearing Restrictions: No RLE Weight Bearing: Weight bearing as tolerated    Mobility  Bed Mobility Overal bed mobility: Needs Assistance Bed Mobility: Supine to Sit;Sit to Supine       Sit to supine: Min assist;Min guard   General bed mobility comments: demonstarted and instructed how to muse belt to self assist    Transfers Overall transfer level: Needs assistance Equipment used: Rolling walker (2 wheeled) Transfers: Sit to/from [facility] Sit to Stand: Min guard Stand pivot transfers: Min guard;Min assist       General transfer comment: cues for hand placement and RLE position.  Ambulation/Gait Ambulation/Gait assistance: Min guard;Supervision Gait Distance (Feet): 45 Feet Assistive device: Rolling walker (2 wheeled) Gait Pattern/deviations: Step-to pattern;Decreased stance time -  right;Decreased dorsiflexion - right Gait velocity: decr   General Gait Details: cues for sequence, RW position and safety. Amb with R AFO for first time, instructed on increasing slightly hip hick   Stairs             Wheelchair Mobility    Modified Rankin (Stroke Patients Only)       Balance                                            Cognition Arousal/Alertness: Awake/alert Behavior During Therapy: WFL for tasks assessed/performed;Anxious Overall Cognitive Status: Within Functional Limits for tasks assessed                                 General Comments: AxO x 3 very pleasant      Exercises  Total Hip Replacement TE's following HEP Handout 10 reps ankle pumps 05 reps knee presses 05 reps heel slides 05 reps SAQ's 05 reps ABD Instructed how to use a belt loop to assist  Followed by [facility]     General Comments        Pertinent Vitals/Pain Pain Assessment: 0-10 Pain Score: 6  Pain Location: R hip Pain Descriptors / Indicators: Sore;Burning;Tightness;Operative site guarding Pain Intervention(s): Monitored during session;Premedicated before session;Repositioned;Ice applied    Home Living  Prior Function            PT Goals (current goals can now be found in the care plan section) Progress towards PT goals: Progressing toward goals    Frequency    7X/week      PT Plan Current plan remains appropriate    Co-evaluation              AM-PAC PT "6 Clicks" Mobility   Outcome Measure  Help needed turning from your back to your side while in a flat bed without using bedrails?: A Lot Help needed moving from lying on your back to sitting on the side of a flat bed without using bedrails?: A Lot Help needed moving to and from a bed to a chair (including a wheelchair)?: A Lot Help needed standing up from a chair using your arms (e.g., wheelchair or bedside chair)?: A Lot Help needed to  walk in hospital room?: A Lot Help needed climbing 3-5 steps with a railing? : Total 6 Click Score: 11    End of Session Equipment Utilized During Treatment: Gait belt Activity Tolerance: Patient tolerated treatment well Patient left: with call bell/phone within reach;in chair;with family/visitor present;with chair alarm set Nurse Communication: Mobility status PT Visit Diagnosis: Other abnormalities of gait and mobility (R26.89);Difficulty in walking, not elsewhere classified (R26.2)     Time: 2081-3887 PT Time Calculation (min) (ACUTE ONLY): 37 min  Charges:  $Gait Training: 8-22 mins $Therapeutic Exercise: 8-22 mins                     {Lari Linson  PTA Acute  Rehabilitation Services Pager      [phone] Office      [phone]

## 2020-08-14 DIAGNOSIS — I10 Essential (primary) hypertension: Secondary | ICD-10-CM | POA: Diagnosis not present

## 2020-08-14 DIAGNOSIS — Z471 Aftercare following joint replacement surgery: Secondary | ICD-10-CM | POA: Diagnosis not present

## 2020-08-14 DIAGNOSIS — Z96641 Presence of right artificial hip joint: Secondary | ICD-10-CM | POA: Diagnosis not present

## 2020-08-14 DIAGNOSIS — R262 Difficulty in walking, not elsewhere classified: Secondary | ICD-10-CM | POA: Diagnosis not present

## 2020-08-16 DIAGNOSIS — M5416 Radiculopathy, lumbar region: Secondary | ICD-10-CM | POA: Diagnosis not present

## 2020-08-16 DIAGNOSIS — M6281 Muscle weakness (generalized): Secondary | ICD-10-CM | POA: Diagnosis not present

## 2020-08-16 DIAGNOSIS — M1611 Unilateral primary osteoarthritis, right hip: Secondary | ICD-10-CM | POA: Diagnosis not present

## 2020-08-16 DIAGNOSIS — R262 Difficulty in walking, not elsewhere classified: Secondary | ICD-10-CM | POA: Diagnosis not present

## 2020-08-16 DIAGNOSIS — R41841 Cognitive communication deficit: Secondary | ICD-10-CM | POA: Diagnosis not present

## 2020-08-16 DIAGNOSIS — M5442 Lumbago with sciatica, left side: Secondary | ICD-10-CM | POA: Diagnosis not present

## 2020-08-16 DIAGNOSIS — I358 Other nonrheumatic aortic valve disorders: Secondary | ICD-10-CM | POA: Diagnosis not present

## 2020-08-16 DIAGNOSIS — Z96641 Presence of right artificial hip joint: Secondary | ICD-10-CM | POA: Diagnosis not present

## 2020-08-16 DIAGNOSIS — E669 Obesity, unspecified: Secondary | ICD-10-CM | POA: Diagnosis not present

## 2020-08-16 DIAGNOSIS — R2681 Unsteadiness on feet: Secondary | ICD-10-CM | POA: Diagnosis not present

## 2020-08-16 DIAGNOSIS — L930 Discoid lupus erythematosus: Secondary | ICD-10-CM | POA: Diagnosis not present

## 2020-08-16 DIAGNOSIS — Z471 Aftercare following joint replacement surgery: Secondary | ICD-10-CM | POA: Diagnosis not present

## 2020-08-16 DIAGNOSIS — Z9889 Other specified postprocedural states: Secondary | ICD-10-CM | POA: Diagnosis not present

## 2020-08-16 DIAGNOSIS — E785 Hyperlipidemia, unspecified: Secondary | ICD-10-CM | POA: Diagnosis not present

## 2020-08-16 DIAGNOSIS — Z720 Tobacco use: Secondary | ICD-10-CM | POA: Diagnosis not present

## 2020-08-16 DIAGNOSIS — I10 Essential (primary) hypertension: Secondary | ICD-10-CM | POA: Diagnosis not present

## 2020-08-16 DIAGNOSIS — G35 Multiple sclerosis: Secondary | ICD-10-CM | POA: Diagnosis not present

## 2020-08-16 DIAGNOSIS — M545 Low back pain, unspecified: Secondary | ICD-10-CM | POA: Diagnosis not present

## 2020-08-20 DIAGNOSIS — I10 Essential (primary) hypertension: Secondary | ICD-10-CM | POA: Diagnosis not present

## 2020-08-22 DIAGNOSIS — G35 Multiple sclerosis: Secondary | ICD-10-CM | POA: Diagnosis not present

## 2020-08-22 DIAGNOSIS — R262 Difficulty in walking, not elsewhere classified: Secondary | ICD-10-CM | POA: Diagnosis not present

## 2020-08-22 DIAGNOSIS — Z471 Aftercare following joint replacement surgery: Secondary | ICD-10-CM | POA: Diagnosis not present

## 2020-08-22 DIAGNOSIS — Z96641 Presence of right artificial hip joint: Secondary | ICD-10-CM | POA: Diagnosis not present

## 2020-08-22 DIAGNOSIS — Z9889 Other specified postprocedural states: Secondary | ICD-10-CM | POA: Diagnosis not present

## 2020-08-23 DIAGNOSIS — Z471 Aftercare following joint replacement surgery: Secondary | ICD-10-CM | POA: Diagnosis not present

## 2020-08-23 DIAGNOSIS — Z96641 Presence of right artificial hip joint: Secondary | ICD-10-CM | POA: Diagnosis not present

## 2020-08-23 DIAGNOSIS — G35 Multiple sclerosis: Secondary | ICD-10-CM | POA: Diagnosis not present

## 2020-08-23 DIAGNOSIS — R262 Difficulty in walking, not elsewhere classified: Secondary | ICD-10-CM | POA: Diagnosis not present

## 2020-08-27 DIAGNOSIS — R262 Difficulty in walking, not elsewhere classified: Secondary | ICD-10-CM | POA: Diagnosis not present

## 2020-08-27 DIAGNOSIS — Z96641 Presence of right artificial hip joint: Secondary | ICD-10-CM | POA: Diagnosis not present

## 2020-08-27 DIAGNOSIS — M21371 Foot drop, right foot: Secondary | ICD-10-CM | POA: Diagnosis not present

## 2020-08-29 DIAGNOSIS — Z96641 Presence of right artificial hip joint: Secondary | ICD-10-CM | POA: Diagnosis not present

## 2020-08-29 DIAGNOSIS — M21371 Foot drop, right foot: Secondary | ICD-10-CM | POA: Diagnosis not present

## 2020-08-29 DIAGNOSIS — R262 Difficulty in walking, not elsewhere classified: Secondary | ICD-10-CM | POA: Diagnosis not present

## 2020-09-02 DIAGNOSIS — B009 Herpesviral infection, unspecified: Secondary | ICD-10-CM | POA: Diagnosis not present

## 2020-09-02 DIAGNOSIS — F325 Major depressive disorder, single episode, in full remission: Secondary | ICD-10-CM | POA: Diagnosis not present

## 2020-09-02 DIAGNOSIS — I10 Essential (primary) hypertension: Secondary | ICD-10-CM | POA: Diagnosis not present

## 2020-09-02 DIAGNOSIS — Z0000 Encounter for general adult medical examination without abnormal findings: Secondary | ICD-10-CM | POA: Diagnosis not present

## 2020-09-02 DIAGNOSIS — Z96641 Presence of right artificial hip joint: Secondary | ICD-10-CM | POA: Diagnosis not present

## 2020-09-02 DIAGNOSIS — Z1159 Encounter for screening for other viral diseases: Secondary | ICD-10-CM | POA: Diagnosis not present

## 2020-09-02 DIAGNOSIS — I700 Atherosclerosis of aorta: Secondary | ICD-10-CM | POA: Diagnosis not present

## 2020-09-02 DIAGNOSIS — E785 Hyperlipidemia, unspecified: Secondary | ICD-10-CM | POA: Diagnosis not present

## 2020-09-02 DIAGNOSIS — L930 Discoid lupus erythematosus: Secondary | ICD-10-CM | POA: Diagnosis not present

## 2020-09-02 DIAGNOSIS — G35 Multiple sclerosis: Secondary | ICD-10-CM | POA: Diagnosis not present

## 2020-09-02 DIAGNOSIS — F17200 Nicotine dependence, unspecified, uncomplicated: Secondary | ICD-10-CM | POA: Diagnosis not present

## 2020-09-03 ENCOUNTER — Telehealth: Payer: Self-pay | Admitting: Neurology

## 2020-09-03 DIAGNOSIS — M21371 Foot drop, right foot: Secondary | ICD-10-CM | POA: Diagnosis not present

## 2020-09-03 DIAGNOSIS — R262 Difficulty in walking, not elsewhere classified: Secondary | ICD-10-CM | POA: Diagnosis not present

## 2020-09-03 DIAGNOSIS — Z96641 Presence of right artificial hip joint: Secondary | ICD-10-CM | POA: Diagnosis not present

## 2020-09-03 NOTE — Telephone Encounter (Signed)
Reminder set in [place].

## 2020-09-03 NOTE — Telephone Encounter (Signed)
I called the patient. Reports new onset of right leg/foot weakness following right hip replacement on 08/09/20 by Dr. Dorna Leitz. She has not been re-evaluated since her surgery. Still in PT twice weekly. She has an appt with him 09/05/20. It would be better for him to examine her physically and manage her post-surgical symptoms. She was in agreement with this plan.

## 2020-09-03 NOTE — Telephone Encounter (Signed)
Pt called, want to know if can increase  baclofen (LIORESAL) 10 MG tablet and gabapentin (NEURONTIN) 400 MG capsule Had hip surgery on 6/4, may possibly have drop foot. After surgery having some numbness right leg and foot. Would like a call from the nurse.

## 2020-09-05 DIAGNOSIS — Z9889 Other specified postprocedural states: Secondary | ICD-10-CM | POA: Diagnosis not present

## 2020-09-05 DIAGNOSIS — R262 Difficulty in walking, not elsewhere classified: Secondary | ICD-10-CM | POA: Diagnosis not present

## 2020-09-05 DIAGNOSIS — Z96641 Presence of right artificial hip joint: Secondary | ICD-10-CM | POA: Diagnosis not present

## 2020-09-05 DIAGNOSIS — M21371 Foot drop, right foot: Secondary | ICD-10-CM | POA: Diagnosis not present

## 2020-09-10 DIAGNOSIS — M21371 Foot drop, right foot: Secondary | ICD-10-CM | POA: Diagnosis not present

## 2020-09-10 DIAGNOSIS — R262 Difficulty in walking, not elsewhere classified: Secondary | ICD-10-CM | POA: Diagnosis not present

## 2020-09-10 DIAGNOSIS — Z96641 Presence of right artificial hip joint: Secondary | ICD-10-CM | POA: Diagnosis not present

## 2020-09-12 DIAGNOSIS — R262 Difficulty in walking, not elsewhere classified: Secondary | ICD-10-CM | POA: Diagnosis not present

## 2020-09-12 DIAGNOSIS — Z96641 Presence of right artificial hip joint: Secondary | ICD-10-CM | POA: Diagnosis not present

## 2020-09-12 DIAGNOSIS — M21371 Foot drop, right foot: Secondary | ICD-10-CM | POA: Diagnosis not present

## 2020-09-17 ENCOUNTER — Telehealth: Payer: Self-pay | Admitting: *Deleted

## 2020-09-17 DIAGNOSIS — R262 Difficulty in walking, not elsewhere classified: Secondary | ICD-10-CM | POA: Diagnosis not present

## 2020-09-17 DIAGNOSIS — M21371 Foot drop, right foot: Secondary | ICD-10-CM | POA: Diagnosis not present

## 2020-09-17 DIAGNOSIS — Z96641 Presence of right artificial hip joint: Secondary | ICD-10-CM | POA: Diagnosis not present

## 2020-09-17 NOTE — Telephone Encounter (Signed)
I called to check on the patient. She had right hip placement on 08/09/20. She was experiencing right leg/foot weakness following surgery. She had follow up with Dr. Berenice Primas on 09/05/20. She is being closely monitored. She tells me her symptoms are starting to slowly improve. She is in the process of transition from walker to cane. She is still going to PT twice weekly. Her next pending appt is at the end of August for re-evaluation.

## 2020-09-19 DIAGNOSIS — R262 Difficulty in walking, not elsewhere classified: Secondary | ICD-10-CM | POA: Diagnosis not present

## 2020-09-19 DIAGNOSIS — Z96641 Presence of right artificial hip joint: Secondary | ICD-10-CM | POA: Diagnosis not present

## 2020-09-19 DIAGNOSIS — M21371 Foot drop, right foot: Secondary | ICD-10-CM | POA: Diagnosis not present

## 2020-09-24 DIAGNOSIS — Z96641 Presence of right artificial hip joint: Secondary | ICD-10-CM | POA: Diagnosis not present

## 2020-09-24 DIAGNOSIS — R262 Difficulty in walking, not elsewhere classified: Secondary | ICD-10-CM | POA: Diagnosis not present

## 2020-09-24 DIAGNOSIS — M21371 Foot drop, right foot: Secondary | ICD-10-CM | POA: Diagnosis not present

## 2020-09-26 DIAGNOSIS — M21371 Foot drop, right foot: Secondary | ICD-10-CM | POA: Diagnosis not present

## 2020-09-26 DIAGNOSIS — R262 Difficulty in walking, not elsewhere classified: Secondary | ICD-10-CM | POA: Diagnosis not present

## 2020-09-26 DIAGNOSIS — Z96641 Presence of right artificial hip joint: Secondary | ICD-10-CM | POA: Diagnosis not present

## 2020-10-01 DIAGNOSIS — M21371 Foot drop, right foot: Secondary | ICD-10-CM | POA: Diagnosis not present

## 2020-10-01 DIAGNOSIS — R262 Difficulty in walking, not elsewhere classified: Secondary | ICD-10-CM | POA: Diagnosis not present

## 2020-10-01 DIAGNOSIS — Z96641 Presence of right artificial hip joint: Secondary | ICD-10-CM | POA: Diagnosis not present

## 2020-10-03 DIAGNOSIS — Z96641 Presence of right artificial hip joint: Secondary | ICD-10-CM | POA: Diagnosis not present

## 2020-10-03 DIAGNOSIS — M21371 Foot drop, right foot: Secondary | ICD-10-CM | POA: Diagnosis not present

## 2020-10-03 DIAGNOSIS — R262 Difficulty in walking, not elsewhere classified: Secondary | ICD-10-CM | POA: Diagnosis not present

## 2020-10-09 DIAGNOSIS — I10 Essential (primary) hypertension: Secondary | ICD-10-CM | POA: Diagnosis not present

## 2020-10-09 DIAGNOSIS — F325 Major depressive disorder, single episode, in full remission: Secondary | ICD-10-CM | POA: Diagnosis not present

## 2020-10-09 DIAGNOSIS — D508 Other iron deficiency anemias: Secondary | ICD-10-CM | POA: Diagnosis not present

## 2020-10-09 DIAGNOSIS — E785 Hyperlipidemia, unspecified: Secondary | ICD-10-CM | POA: Diagnosis not present

## 2020-10-09 DIAGNOSIS — M1712 Unilateral primary osteoarthritis, left knee: Secondary | ICD-10-CM | POA: Diagnosis not present

## 2020-10-09 DIAGNOSIS — M1611 Unilateral primary osteoarthritis, right hip: Secondary | ICD-10-CM | POA: Diagnosis not present

## 2020-10-09 DIAGNOSIS — F4321 Adjustment disorder with depressed mood: Secondary | ICD-10-CM | POA: Diagnosis not present

## 2020-10-10 ENCOUNTER — Other Ambulatory Visit: Payer: Self-pay | Admitting: Neurology

## 2020-10-10 DIAGNOSIS — R262 Difficulty in walking, not elsewhere classified: Secondary | ICD-10-CM | POA: Diagnosis not present

## 2020-10-10 DIAGNOSIS — Z96641 Presence of right artificial hip joint: Secondary | ICD-10-CM | POA: Diagnosis not present

## 2020-10-10 DIAGNOSIS — M21371 Foot drop, right foot: Secondary | ICD-10-CM | POA: Diagnosis not present

## 2020-10-15 DIAGNOSIS — M1711 Unilateral primary osteoarthritis, right knee: Secondary | ICD-10-CM | POA: Diagnosis not present

## 2020-10-15 DIAGNOSIS — Z96641 Presence of right artificial hip joint: Secondary | ICD-10-CM | POA: Diagnosis not present

## 2020-10-15 DIAGNOSIS — M1712 Unilateral primary osteoarthritis, left knee: Secondary | ICD-10-CM | POA: Diagnosis not present

## 2020-10-15 DIAGNOSIS — Z9889 Other specified postprocedural states: Secondary | ICD-10-CM | POA: Diagnosis not present

## 2020-10-24 DIAGNOSIS — F4321 Adjustment disorder with depressed mood: Secondary | ICD-10-CM | POA: Diagnosis not present

## 2020-10-24 DIAGNOSIS — D508 Other iron deficiency anemias: Secondary | ICD-10-CM | POA: Diagnosis not present

## 2020-10-24 DIAGNOSIS — M1611 Unilateral primary osteoarthritis, right hip: Secondary | ICD-10-CM | POA: Diagnosis not present

## 2020-10-24 DIAGNOSIS — F325 Major depressive disorder, single episode, in full remission: Secondary | ICD-10-CM | POA: Diagnosis not present

## 2020-10-24 DIAGNOSIS — M1712 Unilateral primary osteoarthritis, left knee: Secondary | ICD-10-CM | POA: Diagnosis not present

## 2020-10-24 DIAGNOSIS — E785 Hyperlipidemia, unspecified: Secondary | ICD-10-CM | POA: Diagnosis not present

## 2020-10-24 DIAGNOSIS — I10 Essential (primary) hypertension: Secondary | ICD-10-CM | POA: Diagnosis not present

## 2020-11-18 ENCOUNTER — Other Ambulatory Visit: Payer: Self-pay | Admitting: Adult Health

## 2020-11-26 DIAGNOSIS — M25551 Pain in right hip: Secondary | ICD-10-CM | POA: Diagnosis not present

## 2020-11-26 DIAGNOSIS — M1711 Unilateral primary osteoarthritis, right knee: Secondary | ICD-10-CM | POA: Diagnosis not present

## 2020-12-04 DIAGNOSIS — Z96641 Presence of right artificial hip joint: Secondary | ICD-10-CM | POA: Diagnosis not present

## 2020-12-04 DIAGNOSIS — R262 Difficulty in walking, not elsewhere classified: Secondary | ICD-10-CM | POA: Diagnosis not present

## 2020-12-04 DIAGNOSIS — M21371 Foot drop, right foot: Secondary | ICD-10-CM | POA: Diagnosis not present

## 2020-12-11 DIAGNOSIS — M1611 Unilateral primary osteoarthritis, right hip: Secondary | ICD-10-CM | POA: Diagnosis not present

## 2020-12-11 DIAGNOSIS — M1712 Unilateral primary osteoarthritis, left knee: Secondary | ICD-10-CM | POA: Diagnosis not present

## 2020-12-11 DIAGNOSIS — D508 Other iron deficiency anemias: Secondary | ICD-10-CM | POA: Diagnosis not present

## 2020-12-11 DIAGNOSIS — F4321 Adjustment disorder with depressed mood: Secondary | ICD-10-CM | POA: Diagnosis not present

## 2020-12-11 DIAGNOSIS — I10 Essential (primary) hypertension: Secondary | ICD-10-CM | POA: Diagnosis not present

## 2020-12-11 DIAGNOSIS — E785 Hyperlipidemia, unspecified: Secondary | ICD-10-CM | POA: Diagnosis not present

## 2020-12-11 DIAGNOSIS — F325 Major depressive disorder, single episode, in full remission: Secondary | ICD-10-CM | POA: Diagnosis not present

## 2020-12-12 DIAGNOSIS — R262 Difficulty in walking, not elsewhere classified: Secondary | ICD-10-CM | POA: Diagnosis not present

## 2020-12-12 DIAGNOSIS — M21371 Foot drop, right foot: Secondary | ICD-10-CM | POA: Diagnosis not present

## 2020-12-12 DIAGNOSIS — Z96641 Presence of right artificial hip joint: Secondary | ICD-10-CM | POA: Diagnosis not present

## 2020-12-19 DIAGNOSIS — R262 Difficulty in walking, not elsewhere classified: Secondary | ICD-10-CM | POA: Diagnosis not present

## 2020-12-19 DIAGNOSIS — M21371 Foot drop, right foot: Secondary | ICD-10-CM | POA: Diagnosis not present

## 2020-12-19 DIAGNOSIS — Z96641 Presence of right artificial hip joint: Secondary | ICD-10-CM | POA: Diagnosis not present

## 2020-12-26 DIAGNOSIS — M21371 Foot drop, right foot: Secondary | ICD-10-CM | POA: Diagnosis not present

## 2020-12-26 DIAGNOSIS — R262 Difficulty in walking, not elsewhere classified: Secondary | ICD-10-CM | POA: Diagnosis not present

## 2020-12-26 DIAGNOSIS — Z96641 Presence of right artificial hip joint: Secondary | ICD-10-CM | POA: Diagnosis not present

## 2021-01-02 ENCOUNTER — Other Ambulatory Visit: Payer: Self-pay | Admitting: Adult Health

## 2021-01-07 DIAGNOSIS — Z96641 Presence of right artificial hip joint: Secondary | ICD-10-CM | POA: Diagnosis not present

## 2021-01-07 DIAGNOSIS — M21371 Foot drop, right foot: Secondary | ICD-10-CM | POA: Diagnosis not present

## 2021-01-07 DIAGNOSIS — R262 Difficulty in walking, not elsewhere classified: Secondary | ICD-10-CM | POA: Diagnosis not present

## 2021-01-16 DIAGNOSIS — M21371 Foot drop, right foot: Secondary | ICD-10-CM | POA: Diagnosis not present

## 2021-01-16 DIAGNOSIS — Z96641 Presence of right artificial hip joint: Secondary | ICD-10-CM | POA: Diagnosis not present

## 2021-01-16 DIAGNOSIS — R262 Difficulty in walking, not elsewhere classified: Secondary | ICD-10-CM | POA: Diagnosis not present

## 2021-01-16 DIAGNOSIS — Z1231 Encounter for screening mammogram for malignant neoplasm of breast: Secondary | ICD-10-CM | POA: Diagnosis not present

## 2021-01-23 DIAGNOSIS — Z96641 Presence of right artificial hip joint: Secondary | ICD-10-CM | POA: Diagnosis not present

## 2021-01-23 DIAGNOSIS — M21371 Foot drop, right foot: Secondary | ICD-10-CM | POA: Diagnosis not present

## 2021-01-23 DIAGNOSIS — R262 Difficulty in walking, not elsewhere classified: Secondary | ICD-10-CM | POA: Diagnosis not present

## 2021-01-30 DIAGNOSIS — R922 Inconclusive mammogram: Secondary | ICD-10-CM | POA: Diagnosis not present

## 2021-01-30 DIAGNOSIS — R928 Other abnormal and inconclusive findings on diagnostic imaging of breast: Secondary | ICD-10-CM | POA: Diagnosis not present

## 2021-01-30 DIAGNOSIS — R262 Difficulty in walking, not elsewhere classified: Secondary | ICD-10-CM | POA: Diagnosis not present

## 2021-01-30 DIAGNOSIS — M21371 Foot drop, right foot: Secondary | ICD-10-CM | POA: Diagnosis not present

## 2021-01-30 DIAGNOSIS — Z96641 Presence of right artificial hip joint: Secondary | ICD-10-CM | POA: Diagnosis not present

## 2021-02-06 DIAGNOSIS — M25562 Pain in left knee: Secondary | ICD-10-CM | POA: Diagnosis not present

## 2021-02-06 DIAGNOSIS — M25551 Pain in right hip: Secondary | ICD-10-CM | POA: Diagnosis not present

## 2021-02-20 DIAGNOSIS — E785 Hyperlipidemia, unspecified: Secondary | ICD-10-CM | POA: Diagnosis not present

## 2021-02-20 DIAGNOSIS — I10 Essential (primary) hypertension: Secondary | ICD-10-CM | POA: Diagnosis not present

## 2021-02-26 ENCOUNTER — Other Ambulatory Visit: Payer: Self-pay | Admitting: Neurology

## 2021-03-05 DIAGNOSIS — G35 Multiple sclerosis: Secondary | ICD-10-CM | POA: Diagnosis not present

## 2021-03-05 DIAGNOSIS — E785 Hyperlipidemia, unspecified: Secondary | ICD-10-CM | POA: Diagnosis not present

## 2021-03-05 DIAGNOSIS — M21371 Foot drop, right foot: Secondary | ICD-10-CM | POA: Diagnosis not present

## 2021-03-05 DIAGNOSIS — F17200 Nicotine dependence, unspecified, uncomplicated: Secondary | ICD-10-CM | POA: Diagnosis not present

## 2021-03-05 DIAGNOSIS — I700 Atherosclerosis of aorta: Secondary | ICD-10-CM | POA: Diagnosis not present

## 2021-03-05 DIAGNOSIS — E559 Vitamin D deficiency, unspecified: Secondary | ICD-10-CM | POA: Diagnosis not present

## 2021-03-05 DIAGNOSIS — D508 Other iron deficiency anemias: Secondary | ICD-10-CM | POA: Diagnosis not present

## 2021-03-05 DIAGNOSIS — L930 Discoid lupus erythematosus: Secondary | ICD-10-CM | POA: Diagnosis not present

## 2021-03-05 DIAGNOSIS — I10 Essential (primary) hypertension: Secondary | ICD-10-CM | POA: Diagnosis not present

## 2021-03-05 DIAGNOSIS — Z23 Encounter for immunization: Secondary | ICD-10-CM | POA: Diagnosis not present

## 2021-04-17 ENCOUNTER — Ambulatory Visit: Payer: PPO | Admitting: Neurology

## 2021-04-17 ENCOUNTER — Encounter: Payer: Self-pay | Admitting: Neurology

## 2021-04-17 VITALS — BP 120/70 | HR 88 | Ht 61.89 in | Wt 189.0 lb

## 2021-04-17 DIAGNOSIS — I700 Atherosclerosis of aorta: Secondary | ICD-10-CM | POA: Insufficient documentation

## 2021-04-17 DIAGNOSIS — D509 Iron deficiency anemia, unspecified: Secondary | ICD-10-CM | POA: Insufficient documentation

## 2021-04-17 DIAGNOSIS — R269 Unspecified abnormalities of gait and mobility: Secondary | ICD-10-CM

## 2021-04-17 DIAGNOSIS — M25562 Pain in left knee: Secondary | ICD-10-CM | POA: Diagnosis not present

## 2021-04-17 DIAGNOSIS — G8929 Other chronic pain: Secondary | ICD-10-CM | POA: Diagnosis not present

## 2021-04-17 DIAGNOSIS — M21371 Foot drop, right foot: Secondary | ICD-10-CM | POA: Diagnosis not present

## 2021-04-17 DIAGNOSIS — K5901 Slow transit constipation: Secondary | ICD-10-CM | POA: Insufficient documentation

## 2021-04-17 DIAGNOSIS — E785 Hyperlipidemia, unspecified: Secondary | ICD-10-CM | POA: Insufficient documentation

## 2021-04-17 DIAGNOSIS — E559 Vitamin D deficiency, unspecified: Secondary | ICD-10-CM | POA: Insufficient documentation

## 2021-04-17 DIAGNOSIS — L930 Discoid lupus erythematosus: Secondary | ICD-10-CM | POA: Insufficient documentation

## 2021-04-17 DIAGNOSIS — M1610 Unilateral primary osteoarthritis, unspecified hip: Secondary | ICD-10-CM | POA: Insufficient documentation

## 2021-04-17 DIAGNOSIS — F325 Major depressive disorder, single episode, in full remission: Secondary | ICD-10-CM | POA: Insufficient documentation

## 2021-04-17 DIAGNOSIS — E2839 Other primary ovarian failure: Secondary | ICD-10-CM | POA: Insufficient documentation

## 2021-04-17 DIAGNOSIS — M5136 Other intervertebral disc degeneration, lumbar region: Secondary | ICD-10-CM | POA: Insufficient documentation

## 2021-04-17 DIAGNOSIS — F4321 Adjustment disorder with depressed mood: Secondary | ICD-10-CM | POA: Insufficient documentation

## 2021-04-17 DIAGNOSIS — M48061 Spinal stenosis, lumbar region without neurogenic claudication: Secondary | ICD-10-CM

## 2021-04-17 DIAGNOSIS — F17200 Nicotine dependence, unspecified, uncomplicated: Secondary | ICD-10-CM | POA: Insufficient documentation

## 2021-04-17 DIAGNOSIS — Z96641 Presence of right artificial hip joint: Secondary | ICD-10-CM | POA: Insufficient documentation

## 2021-04-17 MED ORDER — DULOXETINE HCL 60 MG PO CPEP: 60.0000 mg | ORAL_CAPSULE | Freq: Every day | ORAL | 11 refills | Status: DC

## 2021-04-17 NOTE — Progress Notes (Signed)
Chief Complaint  Patient presents with   Follow-up    Rm 17. Alone. NX Sonia Baller MD [facility]/right lower extremity NCV/EMG C/o numbness in right foot, toes in right foot, and right ankle.      ASSESSMENT AND PLAN  Andrea Cantrell is a 71 y.o. female   Known history of lumbar stenosis at L3-4, L4-5, Right foot weakness following right anterior approach hip replacement, The sensory loss and weakness of right foot are mainly in the distribution of right common peroneal nerve, also has sural branch sensory loss, Suggestive of right common peroneal neuropathy versus sciatic neuropathy with predominant common peroneal branch involvement She continues to improve over the past few month, hope she will continue the same trend, return in 6 months. Relapsing remitting multiple sclerosis  Continue Vumerity   DIAGNOSTIC DATA (LABS, IMAGING, TESTING) - I reviewed patient records, labs, notes, testing and imaging myself where available.   MEDICAL HISTORY:  Andrea Cantrell is a 71 year old female, seen in request by her primary care doctor Harlan Stains for evaluation of right foot drop, numbness, gait abnormality,  I reviewed and summarized the referring note.  Past medical history  Multiple sclerosis, Hyperlipidemia  Patient has been seen by our clinic for many years with sclerosis,, last visit was in January 2022 MRI of the brain with and without contrast November 19, 2019: Multiple MS lesions, no contrast-enhancement, no change compared to previous scan on March 18, 2017, She was treated with Tecfidera, later switched to Vumerity 231 mg twice a day since November 06, 2019,  She had long history of chronic low back pain, gradually getting worse, severe radiating pain to right hip, MRI lumbar spine June 2022 showed multilevel degenerative disc disease, advanced facet osteoarthritis and associated L4-5 anterolisthesis and right L3-4 active arthritis, compressive  spinal stenosis at L3-4, L4-5  EMG nerve conduction study on March 13, 2020 showed no evidence of large fiber peripheral neuropathy, right lower extremity neuropathy, or active right lumbosacral radiculopathy.  right hip x-ray showed progressive degenerative change  She eventually underwent anterior approach right hip replacement in June 2022  Postsurgically, she noticed dense numbness of the right lateral leg to the top of right foot, right ankle weakness  Over the past few months, she made moderate improvement, right ankle is getting stronger, right hip pain has much resolved, still has right foot numbness, denies left-sided involvement, denies bowel or bladder incontinence.  She still have significant left knee pain   PHYSICAL EXAM:   Vitals:   04/17/21 1544  BP: 120/70  Pulse: 88  Weight: 189 lb (85.7 kg)  Height: 5' 1.89" (1.572 m)   Not recorded     Body mass index is 34.69 kg/m.  PHYSICAL EXAMNIATION:  Gen: NAD, conversant, well nourised, well groomed                     Cardiovascular: Regular rate rhythm, no peripheral edema, warm, nontender. Eyes: Conjunctivae clear without exudates or hemorrhage Neck: Supple, no carotid bruits. Pulmonary: Clear to auscultation bilaterally   NEUROLOGICAL EXAM:  MENTAL STATUS: Speech/cognition: Awake, alert, oriented to history taking and casual conversation   CRANIAL NERVES: CN II: Visual fields are full to confrontation. Pupils are round equal and briskly reactive to light. CN III, IV, VI: extraocular movement are normal. No ptosis. CN V: Facial sensation is intact to light touch CN VII: Face is symmetric with normal eye closure  CN VIII: Hearing is normal to causal  conversation. CN IX, X: Phonation is normal. CN XI: Head turning and shoulder shrug are intact  MOTOR: She has mild right ankle dorsiflexion, eversion weakness, fairly normal right ankle plantarflexion, inversion,  REFLEXES: Reflexes are 2+ and symmetric  at the biceps, triceps, knees, and absent at bilateral ankles. Plantar responses are flexor.  SENSORY: Decreased light touch pinprick at the top of right foot, involving first webspace, extending to right lateral leg  COORDINATION: There is no trunk or limb dysmetria noted.  GAIT/STANCE: Need push-up to get up from seated position, bending forward, antalgic due to left knee pain, mild right foot drop  REVIEW OF SYSTEMS:  Full 14 system review of systems performed and notable only for as above All other review of systems were negative.   ALLERGIES: No Known Allergies  HOME MEDICATIONS: Current Outpatient Medications  Medication Sig Dispense Refill   albuterol (PROVENTIL HFA;VENTOLIN HFA) 108 (90 BASE) MCG/ACT inhaler Inhale 2 puffs into the lungs every 4 (four) hours as needed for wheezing or shortness of breath. For shortness of breath.     amLODipine (NORVASC) 5 MG tablet Take 5 mg by mouth every morning.      aspirin EC 325 MG tablet Take 1 tablet (325 mg total) by mouth 2 (two) times daily after a meal. Take x 1 month post op to decrease risk of blood clots. 60 tablet 0   atorvastatin (LIPITOR) 10 MG tablet Take 10 mg by mouth every morning.      baclofen (LIORESAL) 10 MG tablet TAKE ONE TABLET BY MOUTH FOUR TIMES DAILY 360 tablet 0   Calcium Carbonate (CALCIUM 600 PO) Take 600 mg by mouth in the morning and at bedtime.     celecoxib (CELEBREX) 100 MG capsule Take 1 capsule (100 mg total) by mouth 2 (two) times daily. 60 capsule 0   cholecalciferol (VITAMIN D) 25 MCG (1000 UNIT) tablet Take 1,000 Units by mouth daily.     diphenhydramine-acetaminophen (TYLENOL PM) 25-500 MG TABS tablet Take 1 tablet by mouth at bedtime.     docusate sodium (COLACE) 100 MG capsule Take 1 capsule (100 mg total) by mouth 2 (two) times daily. 30 capsule 0   gabapentin (NEURONTIN) 400 MG capsule Take 1 capsule (400 mg total) by mouth 4 (four) times daily. 360 capsule 4   loratadine (CLARITIN) 10 MG  tablet Take 10 mg by mouth daily as needed for allergies.     polyethylene glycol (MIRALAX / GLYCOLAX) packet Uses as needed for constipation at home.  He can buy this over-the-counter at any drugstore.  Follow package instructions. 14 each 0   pseudoephedrine-guaifenesin (MUCINEX D) 60-600 MG 12 hr tablet Take 1 tablet by mouth 2 (two) times daily as needed for congestion.     valACYclovir (VALTREX) 500 MG tablet Take 500 mg by mouth daily.     VUMERITY 231 MG CPDR TAKE 2 CAPSULES (462MG ) BY MOUTH TWICE DAILY. 120 capsule 5   No current facility-administered medications for this visit.    PAST MEDICAL HISTORY: Past Medical History:  Diagnosis Date   Arthritis    Discoid lupus    Essential hypertension    Fibromyalgia    Heart murmur    Heavy smoker (more than 20 cigarettes per day)    Was able to stop for 11 years but restarted about 10 years ago   Hyperlipidemia with target LDL less than 130    MS (multiple sclerosis) (HCC)    Obesity (BMI 35.0-39.9 without comorbidity)  Palpitations    Pneumonia    Spinal stenosis     PAST SURGICAL HISTORY: Past Surgical History:  Procedure Laterality Date   ABDOMINAL HYSTERECTOMY  1995   BREAST SURGERY Left    tissue removal   [place] N/A 12/09/2016   Procedure: LAPAROSCOPIC INCISIONAL HERNIA REPAIR WITH MESH;  Surgeon: Excell Seltzer, MD;  Location: [facility];  Service: General;  Laterality: N/A;   NM MYOVIEW LTD  October 2015   EF 71%. No ischemia or infarction. Low risk/normal   ROTATOR CUFF REPAIR Left June 2016   TOTAL HIP ARTHROPLASTY Right 08/09/2020   Procedure: TOTAL HIP ARTHROPLASTY ANTERIOR APPROACH;  Surgeon: Dorna Leitz, MD;  Location: [facility];  Service: Orthopedics;  Laterality: Right;   TRANSTHORACIC ECHOCARDIOGRAM  07/2016    (To evaluate murmur).  Normal LV size and function.  EF 60-65%.  No RWMA.  Normal diastolic function. No comment on  valvular disease. -- NORMAL ECHO    FAMILY HISTORY: Family History  Problem Relation Age of Onset   Cancer Mother    Cancer Father    Diabetes Brother    Diabetes Maternal Grandmother     SOCIAL HISTORY: Social History   Socioeconomic History   Marital status: Married    Spouse name: Not on file   Number of children: 2   Years of education: 12+   Highest education level: Not on file  Occupational History   Not on file  Tobacco Use   Smoking status: Every Day    Packs/day: 1.00    Years: 17.00    Pack years: 17.00    Types: Cigarettes   Smokeless tobacco: Never   Tobacco comments:    now maybe 3/4 pack a day starting 02/25/20.  Vaping Use   Vaping Use: Never used  Substance and Sexual Activity   Alcohol use: No   Drug use: No   Sexual activity: Not on file  Other Topics Concern   Not on file  Social History Narrative   Patient lives at home with her son.   Patient is separated. Patient has 2 children.    Patient has some college. Patient is on Disability.    Smokes roughly one pack a day. No alcohol.   Social Determinants of Health   Financial Resource Strain: Not on file  Food Insecurity: Not on file  Transportation Needs: Not on file  Physical Activity: Not on file  Stress: Not on file  Social Connections: Not on file  Intimate Partner Violence: Not on file      Marcial Pacas, M.D. Ph.D.  [facility] [address] [place], [place] [address] Ph: [phone] Fax: [phone]  CC:  Harlan Stains, MD [address],  [place] [id number]  Harlan Stains, MD

## 2021-04-23 ENCOUNTER — Encounter: Payer: PPO | Admitting: Adult Health

## 2021-04-23 ENCOUNTER — Encounter: Payer: Self-pay | Admitting: Adult Health

## 2021-04-24 NOTE — Progress Notes (Signed)
This encounter was created in error - please disregard.

## 2021-05-13 DIAGNOSIS — Z96641 Presence of right artificial hip joint: Secondary | ICD-10-CM | POA: Diagnosis not present

## 2021-05-13 DIAGNOSIS — M1712 Unilateral primary osteoarthritis, left knee: Secondary | ICD-10-CM | POA: Diagnosis not present

## 2021-05-14 ENCOUNTER — Other Ambulatory Visit: Payer: Self-pay | Admitting: Neurology

## 2021-05-18 ENCOUNTER — Other Ambulatory Visit: Payer: Self-pay | Admitting: Neurology

## 2021-05-20 DIAGNOSIS — I10 Essential (primary) hypertension: Secondary | ICD-10-CM | POA: Diagnosis not present

## 2021-05-20 DIAGNOSIS — E785 Hyperlipidemia, unspecified: Secondary | ICD-10-CM | POA: Diagnosis not present

## 2021-05-21 ENCOUNTER — Ambulatory Visit: Payer: PPO | Admitting: Cardiology

## 2021-05-21 ENCOUNTER — Encounter: Payer: Self-pay | Admitting: Cardiology

## 2021-05-21 VITALS — BP 124/66 | HR 69 | Ht 62.0 in | Wt 188.0 lb

## 2021-05-21 DIAGNOSIS — R0609 Other forms of dyspnea: Secondary | ICD-10-CM

## 2021-05-21 DIAGNOSIS — I700 Atherosclerosis of aorta: Secondary | ICD-10-CM | POA: Diagnosis not present

## 2021-05-21 DIAGNOSIS — E785 Hyperlipidemia, unspecified: Secondary | ICD-10-CM | POA: Diagnosis not present

## 2021-05-21 DIAGNOSIS — I10 Essential (primary) hypertension: Secondary | ICD-10-CM

## 2021-05-21 DIAGNOSIS — E669 Obesity, unspecified: Secondary | ICD-10-CM | POA: Diagnosis not present

## 2021-05-21 DIAGNOSIS — R002 Palpitations: Secondary | ICD-10-CM

## 2021-05-21 DIAGNOSIS — Z716 Tobacco abuse counseling: Secondary | ICD-10-CM | POA: Diagnosis not present

## 2021-05-21 NOTE — Patient Instructions (Addendum)
Medication Instructions:  ?Not needed ? ?*If you need a refill on your cardiac medications before your next appointment, please call your pharmacy* ? ? ?Lab Work:fasting ?Lipid ?CMP ?HgbA1c ?If you have labs (blood work) drawn today and your tests are completely normal, you will receive your results only by: ?MyChart Message (if you have MyChart) OR ?A paper copy in the mail ?If you have any lab test that is abnormal or we need to change your treatment, we will call you to review the results. ? ? ?Testing/Procedures: ?Not needed ? ? ?Follow-Up: ?At [facility], you and your health needs are our priority.  As part of our continuing mission to provide you with exceptional heart care, we have created designated Provider Care Teams.  These Care Teams include your primary Cardiologist (physician) and Advanced Practice Providers (APPs -  Physician Assistants and Nurse Practitioners) who all work together to provide you with the care you need, when you need it. ? ?  ? ?Your next appointment:   ?12 month(s) ? ?The format for your next appointment:   ?In Person ? ?Provider:   ?Glenetta Hew, MD  ? ? ? ?

## 2021-05-21 NOTE — Progress Notes (Signed)
? ?Primary Care Provider: Harlan Stains, MD ?Cardiologist: Glenetta Hew, MD ?Electrophysiologist: None ? ?Clinic Note: ?Chief Complaint  ?Patient presents with  ? Follow-up  ?  Delayed  ? ?=================================== ? ?ASSESSMENT/PLAN  ? ?Problem List Items Addressed This Visit   ? ?  ? Cardiology Problems  ? Essential hypertension (Chronic)  ?  Blood pressure was well controlled on amlodipine 5 mg daily. ? ?  ?  ? Relevant Medications  ? ASPIRIN 81 PO  ? Other Relevant Orders  ? Lipid panel (Completed)  ? Comprehensive metabolic panel (Completed)  ? Hemoglobin A1c (Completed)  ? Hyperlipidemia with target LDL less than 100 - Primary (Chronic)  ?  Has not had labs checked in quite some time now.  We will recheck lipids, chemistry and A1c. ?Continue current dose of atorvastatin ? ?  ?  ? Relevant Medications  ? ASPIRIN 81 PO  ? Other Relevant Orders  ? Lipid panel (Completed)  ? Comprehensive metabolic panel (Completed)  ? Hemoglobin A1c (Completed)  ? Aortic atherosclerosis (HCC) (Chronic)  ?  Respect modification with blood pressure, lipid and glycemic control. ? ?She is on aspirin, statin, and amlodipine. ? ?  ?  ? Relevant Medications  ? ASPIRIN 81 PO  ? Other Relevant Orders  ? EKG 12-Lead (Completed)  ? Lipid panel (Completed)  ? Comprehensive metabolic panel (Completed)  ? Hemoglobin A1c (Completed)  ?  ? Other  ? Tobacco abuse counseling (Chronic)  ?  She is cutting down, but not ready to make the final step.  Interestingly, she is having chest pain associate with smoking, she is more interested in quitting.  Just not quite ready to make that step. ? ?  ?  ? Palpitations (Chronic)  ?  Still has palpitations but no other associated symptoms.  Relatively short-lived episodes.,  Not lasting long enough to bother her. ? ?As long as they are not overly bothersome to her, we do not need to worry about detailed evaluation.  They do not occur frequently enough to capture with a routine monitor. ? ?  ?  ?  Relevant Orders  ? EKG 12-Lead (Completed)  ? Lipid panel (Completed)  ? Comprehensive metabolic panel (Completed)  ? Hemoglobin A1c (Completed)  ? DOE (dyspnea on exertion)  ?  Mostly related to deconditioning and obesity. ? ?  ?  ? Obesity (BMI 35.0-39.9 without comorbidity) (HCC) (Chronic)  ? Relevant Orders  ? Lipid panel (Completed)  ? Comprehensive metabolic panel (Completed)  ? Hemoglobin A1c (Completed)  ? ? ?=================================== ? ?HPI:   ? ?Andrea Cantrell is a 71 y.o. female with a PMH notable for fibromyalgia, multiple sclerosis, spinal stenosis, and discoid lupus along with HTN and HLD who presents today for 60-monthfollow-up at the request of WHarlan Stains MD. ? ?Andrea JEANCHARLESwas last seen in October 2021 as a 2-year follow-up.  She was doing well at that time as well.  She thought she may have passed out and had some balance issues associated with change of multiple sclerosis medications.  This resolved with the medication change back.  Noted palpitations but nothing associated with physical activity including prolonged.  Dyspnea regular heartbeats. ? ?Recent Hospitalizations: None since last June ? ?Reviewed  CV studies:   ? ?The following studies were reviewed today: (if available, images/films reviewed: From Epic Chart or Care Everywhere) ?None: ? ?Interval History:  ? ?Andrea Cantrell presents here today for follow-up doing okay.  No major issues.  Her  routine follow-up was delayed due to hip surgery last June. ?She still notes having off-and-on palpitations but did not last for long on the monitor.  She also notes occasional pain on the left breast.  Usually occurs after smoking, and they also at rest and when lying down..  Is not with associated with walking.  Baseline exertional dyspnea due to deconditioning and obesity. ? ?Every now and then she gets swimmy headed and lightheaded/dizzy but not a serial static in nature. ? ?CV Review of Symptoms (Summary) ?Cardiovascular  ROS: positive for - -Left lower chest pain, exertional dyspnea, and off-and-on palpitations ?negative for - edema, orthopnea, paroxysmal nocturnal dyspnea, rapid heart rate, shortness of breath, or syncope or near syncope, TIA/amaurosis fugax or claudication ? ?REVIEWED OF SYSTEMS  ? ?Review of Systems  ?Constitutional:  Positive for malaise/fatigue (Decondition.  A little better after hip surgery but still slow recovery.).  ?HENT:  Negative for congestion and nosebleeds.   ?Respiratory:  Negative for cough and shortness of breath.   ?Cardiovascular:   ?     Per HPI  ?Gastrointestinal:  Negative for blood in stool and melena.  ?Genitourinary:  Negative for hematuria.  ?Musculoskeletal:  Positive for back pain and joint pain.  ?Neurological:  Positive for dizziness and tingling (Numbness or tingling associated with MS.). Negative for focal weakness and weakness.  ?Endo/Heme/Allergies:  Bruises/bleeds easily.  ?Psychiatric/Behavioral: Negative.  The patient does not have insomnia (Just hard to get back to sleep when she wakes up.).   ? ?I have reviewed and (if needed) personally updated the patient's problem list, medications, allergies, past medical and surgical history, social and family history.  ? ?PAST MEDICAL HISTORY  ? ?Past Medical History:  ?Diagnosis Date  ? Arthritis   ? Discoid lupus   ? Essential hypertension   ? Fibromyalgia   ? Heart murmur   ? Heavy smoker (more than 20 cigarettes per day)   ? Was able to stop for 11 years but restarted about 10 years ago  ? Hyperlipidemia with target LDL less than 130   ? MS (multiple sclerosis) ([place])   ? Obesity (BMI 35.0-39.9 without comorbidity)   ? Palpitations   ? Pneumonia   ? Spinal stenosis   ? ? ?PAST SURGICAL HISTORY  ? ?Past Surgical History:  ?Procedure Laterality Date  ? ABDOMINAL HYSTERECTOMY  1995  ? BREAST SURGERY Left   ? tissue removal  ? [place]  ? CHOLECYSTECTOMY  1994  ? INCISIONAL HERNIA REPAIR N/A 12/09/2016  ? Procedure:  LAPAROSCOPIC INCISIONAL HERNIA REPAIR WITH MESH;  Surgeon: Excell Seltzer, MD;  Location: [facility];  Service: General;  Laterality: N/A;  ? NM MYOVIEW LTD  October 2015  ? EF 71%. No ischemia or infarction. Low risk/normal  ? ROTATOR CUFF REPAIR Left June 2016  ? TOTAL HIP ARTHROPLASTY Right 08/09/2020  ? Procedure: TOTAL HIP ARTHROPLASTY ANTERIOR APPROACH;  Surgeon: Dorna Leitz, MD;  Location: [facility];  Service: Orthopedics;  Laterality: Right;  ? TRANSTHORACIC ECHOCARDIOGRAM  07/2016  ?  (To evaluate murmur).  Normal LV size and function.  EF 60-65%.  No RWMA.  Normal diastolic function. No comment on valvular disease. -- NORMAL ECHO  ? ? ?There is no immunization history on file for this patient. ? ?MEDICATIONS/ALLERGIES  ? ?Current Meds  ?Medication Sig  ? albuterol (PROVENTIL HFA;VENTOLIN HFA) 108 (90 BASE) MCG/ACT inhaler Inhale 2 puffs into the lungs every 4 (four) hours as needed for wheezing or shortness  of breath. For shortness of breath.  ? amLODipine (NORVASC) 5 MG tablet Take 5 mg by mouth every morning.   ? ASPIRIN 81 PO Take 81 mg by mouth daily.  ? atorvastatin (LIPITOR) 10 MG tablet Take 10 mg by mouth every morning.   ? baclofen (LIORESAL) 10 MG tablet Take 1 tablet (10 mg total) by mouth 4 (four) times daily.  ? Calcium Carbonate (CALCIUM 600 PO) Take 600 mg by mouth in the morning and at bedtime.  ? celecoxib (CELEBREX) 100 MG capsule Take 1 capsule (100 mg total) by mouth 2 (two) times daily.  ? cholecalciferol (VITAMIN D) 25 MCG (1000 UNIT) tablet Take 1,000 Units by mouth daily.  ? diphenhydramine-acetaminophen (TYLENOL PM) 25-500 MG TABS tablet Take 1 tablet by mouth at bedtime.  ? docusate sodium (COLACE) 100 MG capsule Take 1 capsule (100 mg total) by mouth 2 (two) times daily.  ? DULoxetine (CYMBALTA) 60 MG capsule Take 1 capsule (60 mg total) by mouth daily.  ? gabapentin (NEURONTIN) 400 MG capsule TAKE ONE CAPSULE BY MOUTH FOUR TIMES DAILY  ? loratadine (CLARITIN) 10 MG tablet Take 10  mg by mouth daily as needed for allergies.  ? polyethylene glycol (MIRALAX / GLYCOLAX) packet Uses as needed for constipation at home.  He can buy this over-the-counter at any drugstore.  Follow packag

## 2021-05-23 DIAGNOSIS — E669 Obesity, unspecified: Secondary | ICD-10-CM | POA: Diagnosis not present

## 2021-05-23 DIAGNOSIS — I10 Essential (primary) hypertension: Secondary | ICD-10-CM | POA: Diagnosis not present

## 2021-05-23 DIAGNOSIS — R002 Palpitations: Secondary | ICD-10-CM | POA: Diagnosis not present

## 2021-05-23 DIAGNOSIS — E785 Hyperlipidemia, unspecified: Secondary | ICD-10-CM | POA: Diagnosis not present

## 2021-05-23 DIAGNOSIS — I700 Atherosclerosis of aorta: Secondary | ICD-10-CM | POA: Diagnosis not present

## 2021-05-23 LAB — COMPREHENSIVE METABOLIC PANEL
ALT: 13 IU/L (ref 0–32)
AST: 17 IU/L (ref 0–40)
Albumin/Globulin Ratio: 2 (ref 1.2–2.2)
Albumin: 4.3 g/dL (ref 3.7–4.7)
Alkaline Phosphatase: 118 IU/L (ref 44–121)
BUN/Creatinine Ratio: 13 (ref 12–28)
BUN: 10 mg/dL (ref 8–27)
Bilirubin Total: 0.5 mg/dL (ref 0.0–1.2)
CO2: 22 mmol/L (ref 20–29)
Calcium: 9.1 mg/dL (ref 8.7–10.3)
Chloride: 107 mmol/L — ABNORMAL HIGH (ref 96–106)
Creatinine, Ser: 0.78 mg/dL (ref 0.57–1.00)
Globulin, Total: 2.2 g/dL (ref 1.5–4.5)
Glucose: 88 mg/dL (ref 70–99)
Potassium: 4.7 mmol/L (ref 3.5–5.2)
Sodium: 145 mmol/L — ABNORMAL HIGH (ref 134–144)
Total Protein: 6.5 g/dL (ref 6.0–8.5)
eGFR: 81 mL/min/{1.73_m2} (ref >59)

## 2021-05-23 LAB — LIPID PANEL
Chol/HDL Ratio: 1.6 ratio (ref 0.0–4.4)
Cholesterol, Total: 172 mg/dL (ref 100–199)
HDL: 109 mg/dL (ref >39)
LDL Chol Calc (NIH): 53 mg/dL (ref 0–99)
Triglycerides: 47 mg/dL (ref 0–149)
VLDL Cholesterol Cal: 10 mg/dL (ref 5–40)

## 2021-05-23 LAB — HEMOGLOBIN A1C
Est. average glucose Bld gHb Est-mCnc: 120 mg/dL
Hgb A1c MFr Bld: 5.8 % — ABNORMAL HIGH (ref 4.8–5.6)

## 2021-06-20 DIAGNOSIS — J069 Acute upper respiratory infection, unspecified: Secondary | ICD-10-CM | POA: Diagnosis not present

## 2021-06-29 ENCOUNTER — Encounter: Payer: Self-pay | Admitting: Cardiology

## 2021-06-29 NOTE — Assessment & Plan Note (Signed)
She is cutting down, but not ready to make the final step.  Interestingly, she is having chest pain associate with smoking, she is more interested in quitting.  Just not quite ready to make that step. ?

## 2021-06-29 NOTE — Assessment & Plan Note (Signed)
Still has palpitations but no other associated symptoms.  Relatively short-lived episodes.,  Not lasting long enough to bother her. ? ?As long as they are not overly bothersome to her, we do not need to worry about detailed evaluation.  They do not occur frequently enough to capture with a routine monitor. ?

## 2021-06-29 NOTE — Assessment & Plan Note (Signed)
Has not had labs checked in quite some time now.  We will recheck lipids, chemistry and A1c. ?Continue current dose of atorvastatin ?

## 2021-06-29 NOTE — Assessment & Plan Note (Signed)
Mostly related to deconditioning and obesity. ?

## 2021-06-29 NOTE — Assessment & Plan Note (Signed)
Respect modification with blood pressure, lipid and glycemic control. ? ?She is on aspirin, statin, and amlodipine. ?

## 2021-06-29 NOTE — Assessment & Plan Note (Signed)
Blood pressure was well controlled on amlodipine 5 mg daily. ?

## 2021-07-16 ENCOUNTER — Other Ambulatory Visit: Payer: Self-pay | Admitting: Adult Health

## 2021-08-07 DIAGNOSIS — M1712 Unilateral primary osteoarthritis, left knee: Secondary | ICD-10-CM | POA: Diagnosis not present

## 2021-08-07 DIAGNOSIS — Z09 Encounter for follow-up examination after completed treatment for conditions other than malignant neoplasm: Secondary | ICD-10-CM | POA: Diagnosis not present

## 2021-08-07 DIAGNOSIS — M25562 Pain in left knee: Secondary | ICD-10-CM | POA: Diagnosis not present

## 2021-08-07 DIAGNOSIS — Z96641 Presence of right artificial hip joint: Secondary | ICD-10-CM | POA: Diagnosis not present

## 2021-08-14 DIAGNOSIS — E785 Hyperlipidemia, unspecified: Secondary | ICD-10-CM | POA: Diagnosis not present

## 2021-08-14 DIAGNOSIS — I10 Essential (primary) hypertension: Secondary | ICD-10-CM | POA: Diagnosis not present

## 2021-09-04 DIAGNOSIS — I10 Essential (primary) hypertension: Secondary | ICD-10-CM | POA: Diagnosis not present

## 2021-09-04 DIAGNOSIS — F325 Major depressive disorder, single episode, in full remission: Secondary | ICD-10-CM | POA: Diagnosis not present

## 2021-09-04 DIAGNOSIS — B009 Herpesviral infection, unspecified: Secondary | ICD-10-CM | POA: Diagnosis not present

## 2021-09-04 DIAGNOSIS — E785 Hyperlipidemia, unspecified: Secondary | ICD-10-CM | POA: Diagnosis not present

## 2021-09-04 DIAGNOSIS — R7303 Prediabetes: Secondary | ICD-10-CM | POA: Diagnosis not present

## 2021-09-04 DIAGNOSIS — I700 Atherosclerosis of aorta: Secondary | ICD-10-CM | POA: Diagnosis not present

## 2021-09-04 DIAGNOSIS — E559 Vitamin D deficiency, unspecified: Secondary | ICD-10-CM | POA: Diagnosis not present

## 2021-09-04 DIAGNOSIS — F17200 Nicotine dependence, unspecified, uncomplicated: Secondary | ICD-10-CM | POA: Diagnosis not present

## 2021-09-04 DIAGNOSIS — M8588 Other specified disorders of bone density and structure, other site: Secondary | ICD-10-CM | POA: Diagnosis not present

## 2021-09-04 DIAGNOSIS — Z0000 Encounter for general adult medical examination without abnormal findings: Secondary | ICD-10-CM | POA: Diagnosis not present

## 2021-09-04 DIAGNOSIS — G35 Multiple sclerosis: Secondary | ICD-10-CM | POA: Diagnosis not present

## 2021-09-04 DIAGNOSIS — J9801 Acute bronchospasm: Secondary | ICD-10-CM | POA: Diagnosis not present

## 2021-09-05 ENCOUNTER — Other Ambulatory Visit: Payer: Self-pay | Admitting: Family Medicine

## 2021-09-05 DIAGNOSIS — M8580 Other specified disorders of bone density and structure, unspecified site: Secondary | ICD-10-CM

## 2021-10-13 DIAGNOSIS — M25562 Pain in left knee: Secondary | ICD-10-CM | POA: Diagnosis not present

## 2021-10-28 ENCOUNTER — Ambulatory Visit: Payer: PPO | Admitting: Neurology

## 2021-11-03 ENCOUNTER — Encounter: Payer: Self-pay | Admitting: Neurology

## 2021-11-03 ENCOUNTER — Ambulatory Visit: Payer: PPO | Admitting: Neurology

## 2021-11-03 VITALS — BP 125/84 | HR 73 | Ht 62.0 in | Wt 190.0 lb

## 2021-11-03 DIAGNOSIS — R7989 Other specified abnormal findings of blood chemistry: Secondary | ICD-10-CM | POA: Diagnosis not present

## 2021-11-03 DIAGNOSIS — R6889 Other general symptoms and signs: Secondary | ICD-10-CM | POA: Diagnosis not present

## 2021-11-03 DIAGNOSIS — M21371 Foot drop, right foot: Secondary | ICD-10-CM | POA: Diagnosis not present

## 2021-11-03 DIAGNOSIS — M48061 Spinal stenosis, lumbar region without neurogenic claudication: Secondary | ICD-10-CM | POA: Diagnosis not present

## 2021-11-03 DIAGNOSIS — G35 Multiple sclerosis: Secondary | ICD-10-CM | POA: Diagnosis not present

## 2021-11-03 DIAGNOSIS — Z01812 Encounter for preprocedural laboratory examination: Secondary | ICD-10-CM | POA: Diagnosis not present

## 2021-11-03 MED ORDER — DICLOFENAC SODIUM 1 % EX GEL: CUTANEOUS | 11 refills | Status: DC

## 2021-11-03 NOTE — Progress Notes (Addendum)
Chief Complaint  Patient presents with   Follow-up    Rm 14. Alone. C/o numbness and cramping in right leg and foot. Having daily issues where she feels like her left knee is buckling.      ASSESSMENT AND PLAN  Andrea Cantrell is a 71 y.o. female    Right foot weakness following right anterior approach hip replacement,in June 2022 The sensory loss and weakness of right foot are mainly in the distribution of right common peroneal nerve, also has sural branch sensory loss, Suggestive of right common peroneal neuropathy versus sciatic neuropathy with predominant common peroneal branch involvement She continues to improve, no significant pain, only has slight right ankle dorsiflexion weakness, Suggested right AFO  Relapsing remitting multiple sclerosis  Continue Vumerity Laboratory evaluations including CBC with differentiation Repeat MRI of the brain with without contrast  Obesity  Referral to weight management  DIAGNOSTIC DATA (LABS, IMAGING, TESTING) - I reviewed patient records, labs, notes, testing and imaging myself where available.  JC virus titer February 04, 2022 was +1.70, MEDICAL HISTORY:  Andrea Cantrell is a 71 year old female, seen in request by her primary care doctor Harlan Stains for evaluation of right foot drop, numbness, gait abnormality,  I reviewed and summarized the referring note.  Past medical history  Multiple sclerosis, Hyperlipidemia  Patient has been seen by our clinic for many years with sclerosis,, last visit was in January 2022 MRI of the brain with and without contrast November 19, 2019: Multiple MS lesions, no contrast-enhancement, no change compared to previous scan on March 18, 2017, She was treated with Tecfidera, later switched to Vumerity 231 mg twice a day since November 06, 2019,  She had long history of chronic low back pain, gradually getting worse, severe radiating pain to right hip, MRI lumbar spine June 2022 showed  multilevel degenerative disc disease, advanced facet osteoarthritis and associated L4-5 anterolisthesis and right L3-4 active arthritis, compressive spinal stenosis at L3-4, L4-5  EMG nerve conduction study on March 13, 2020 showed no evidence of large fiber peripheral neuropathy, right lower extremity neuropathy, or active right lumbosacral radiculopathy.  right hip x-ray showed progressive degenerative change  She eventually underwent anterior approach right hip replacement in June 2022  Postsurgically, she noticed dense numbness of the right lateral leg to the top of right foot, right ankle weakness  Over the past few months, she made moderate improvement, right ankle is getting stronger, right hip pain has much resolved, still has right foot numbness, denies left-sided involvement, denies bowel or bladder incontinence.  She still have significant left knee pain  UPDATE Sept 18 2023: She has developed right foot weakness, numbness following right anterior approach hip replacement surgery in June 2022, over the past 1 year, she had steady slow improvement, no worsening, continue has right foot normal sensation, some low back pain, the most bothersome symptoms for her now is left knee pain  She has no flareup of her MS symptoms, continue to take Vumerity, tolerating it well,    PHYSICAL EXAM:   Vitals:   11/03/21 1337  BP: 125/84  Pulse: 73  Weight: 190 lb (86.2 kg)  Height: '5\' 2"'$  (1.575 m)    Body mass index is 34.75 kg/m.  PHYSICAL EXAMNIATION:  Gen: NAD, conversant, well nourised, well groomed                     Cardiovascular: Regular rate rhythm, no peripheral edema, warm, nontender. Eyes: Conjunctivae clear without exudates  or hemorrhage Neck: Supple, no carotid bruits. Pulmonary: Clear to auscultation bilaterally   NEUROLOGICAL EXAM:  MENTAL STATUS: Speech/cognition: Awake, alert, oriented to history taking and casual conversation   CRANIAL NERVES: CN II:  Visual fields are full to confrontation. Pupils are round equal and briskly reactive to light. CN III, IV, VI: extraocular movement are normal. No ptosis. CN V: Facial sensation is intact to light touch CN VII: Face is symmetric with normal eye closure  CN VIII: Hearing is normal to causal conversation. CN IX, X: Phonation is normal. CN XI: Head turning and shoulder shrug are intact  MOTOR: She has mild right ankle dorsiflexion, eversion weakness, fairly normal right ankle plantarflexion, inversion,  REFLEXES: Reflexes are 2+ and symmetric at the biceps, triceps, knees, and absent at bilateral ankles. Plantar responses are flexor.  SENSORY: Decreased light touch pinprick at the top of right foot, involving first webspace, extending to right lateral leg  COORDINATION: There is no trunk or limb dysmetria noted.  GAIT/STANCE: Need push-up to get up from seated position, bending forward, antalgic due to left knee pain, mild right foot drop  REVIEW OF SYSTEMS:  Full 14 system review of systems performed and notable only for as above All other review of systems were negative.   ALLERGIES: No Known Allergies  HOME MEDICATIONS: Current Outpatient Medications  Medication Sig Dispense Refill   albuterol (PROVENTIL HFA;VENTOLIN HFA) 108 (90 BASE) MCG/ACT inhaler Inhale 2 puffs into the lungs every 4 (four) hours as needed for wheezing or shortness of breath. For shortness of breath.     amLODipine (NORVASC) 5 MG tablet Take 5 mg by mouth every morning.      ASPIRIN 81 PO Take 81 mg by mouth daily.     atorvastatin (LIPITOR) 10 MG tablet Take 10 mg by mouth every morning.      baclofen (LIORESAL) 10 MG tablet Take 1 tablet (10 mg total) by mouth 4 (four) times daily. 360 tablet 3   Calcium Carbonate (CALCIUM 600 PO) Take 600 mg by mouth in the morning and at bedtime.     cholecalciferol (VITAMIN D) 25 MCG (1000 UNIT) tablet Take 1,000 Units by mouth daily.     diphenhydramine-acetaminophen  (TYLENOL PM) 25-500 MG TABS tablet Take 1 tablet by mouth at bedtime.     docusate sodium (COLACE) 100 MG capsule Take 1 capsule (100 mg total) by mouth 2 (two) times daily. 30 capsule 0   DULoxetine (CYMBALTA) 60 MG capsule Take 1 capsule (60 mg total) by mouth daily. 30 capsule 11   gabapentin (NEURONTIN) 400 MG capsule TAKE ONE CAPSULE BY MOUTH FOUR TIMES DAILY 360 capsule 4   loratadine (CLARITIN) 10 MG tablet Take 10 mg by mouth daily as needed for allergies.     polyethylene glycol (MIRALAX / GLYCOLAX) packet Uses as needed for constipation at home.  He can buy this over-the-counter at any drugstore.  Follow package instructions. 14 each 0   pseudoephedrine-guaifenesin (MUCINEX D) 60-600 MG 12 hr tablet Take 1 tablet by mouth 2 (two) times daily as needed for congestion.     valACYclovir (VALTREX) 500 MG tablet Take 500 mg by mouth daily.     VUMERITY 231 MG CPDR TAKE 2 CAPSULES ('462MG'$ ) BY MOUTH TWICE DAILY. 120 capsule 4   No current facility-administered medications for this visit.    PAST MEDICAL HISTORY: Past Medical History:  Diagnosis Date   Arthritis    Discoid lupus    Essential hypertension    Fibromyalgia  Heart murmur    Heavy smoker (more than 20 cigarettes per day)    Was able to stop for 11 years but restarted about 10 years ago   Hyperlipidemia with target LDL less than 130    MS (multiple sclerosis) (HCC)    Obesity (BMI 35.0-39.9 without comorbidity)    Palpitations    Pneumonia    Spinal stenosis     PAST SURGICAL HISTORY: Past Surgical History:  Procedure Laterality Date   ABDOMINAL HYSTERECTOMY  1995   BREAST SURGERY Left    tissue removal   [place] N/A 12/09/2016   Procedure: LAPAROSCOPIC INCISIONAL HERNIA REPAIR WITH MESH;  Surgeon: Excell Seltzer, MD;  Location: [facility];  Service: General;  Laterality: N/A;   NM MYOVIEW LTD  October 2015   EF 71%. No ischemia or  infarction. Low risk/normal   ROTATOR CUFF REPAIR Left June 2016   TOTAL HIP ARTHROPLASTY Right 08/09/2020   Procedure: TOTAL HIP ARTHROPLASTY ANTERIOR APPROACH;  Surgeon: Dorna Leitz, MD;  Location: [facility];  Service: Orthopedics;  Laterality: Right;   TRANSTHORACIC ECHOCARDIOGRAM  07/2016    (To evaluate murmur).  Normal LV size and function.  EF 60-65%.  No RWMA.  Normal diastolic function. No comment on valvular disease. -- NORMAL ECHO    FAMILY HISTORY: Family History  Problem Relation Age of Onset   Cancer Mother    Cancer Father    Diabetes Brother    Diabetes Maternal Grandmother    Multiple sclerosis Niece     SOCIAL HISTORY: Social History   Socioeconomic History   Marital status: Married    Spouse name: Not on file   Number of children: 2   Years of education: 12+   Highest education level: Not on file  Occupational History   Not on file  Tobacco Use   Smoking status: Every Day    Packs/day: 1.00    Years: 17.00    Total pack years: 17.00    Types: Cigarettes   Smokeless tobacco: Never   Tobacco comments:    now maybe 3/4 pack a day starting 02/25/20.  Vaping Use   Vaping Use: Never used  Substance and Sexual Activity   Alcohol use: No   Drug use: No   Sexual activity: Not on file  Other Topics Concern   Not on file  Social History Narrative   Patient lives at home with her son.   Patient is separated. Patient has 2 children.    Patient has some college. Patient is on Disability.    Smokes roughly one pack a day. No alcohol.   Social Determinants of Health   Financial Resource Strain: Not on file  Food Insecurity: Not on file  Transportation Needs: Not on file  Physical Activity: Not on file  Stress: Not on file  Social Connections: Not on file  Intimate Partner Violence: Not on file      Marcial Pacas, M.D. Ph.D.  [facility] [address] [place], [place] [address] Ph: [phone] Fax: [phone]  CC:   Harlan Stains, MD [place],  [place] [address]  Harlan Stains, MD Mrs.

## 2021-11-04 ENCOUNTER — Telehealth: Payer: Self-pay | Admitting: Neurology

## 2021-11-04 LAB — CBC WITH DIFFERENTIAL/PLATELET
Basophils Absolute: 0 10*3/uL (ref 0.0–0.2)
Basos: 1 %
EOS (ABSOLUTE): 0.1 10*3/uL (ref 0.0–0.4)
Eos: 2 %
Hematocrit: 37.8 % (ref 34.0–46.6)
Hemoglobin: 12.3 g/dL (ref 11.1–15.9)
Immature Grans (Abs): 0 10*3/uL (ref 0.0–0.1)
Immature Granulocytes: 0 %
Lymphocytes Absolute: 0.3 10*3/uL — ABNORMAL LOW (ref 0.7–3.1)
Lymphs: 8 %
MCH: 26.4 pg — ABNORMAL LOW (ref 26.6–33.0)
MCHC: 32.5 g/dL (ref 31.5–35.7)
MCV: 81 fL (ref 79–97)
Monocytes Absolute: 0.4 10*3/uL (ref 0.1–0.9)
Monocytes: 10 %
Neutrophils Absolute: 3.4 10*3/uL (ref 1.4–7.0)
Neutrophils: 79 %
Platelets: 373 10*3/uL (ref 150–450)
RBC: 4.66 x10E6/uL (ref 3.77–5.28)
RDW: 15 % (ref 11.7–15.4)
WBC: 4.3 10*3/uL (ref 3.4–10.8)

## 2021-11-04 LAB — TSH: TSH: 0.747 u[IU]/mL (ref 0.450–4.500)

## 2021-11-04 NOTE — Telephone Encounter (Signed)
[employer] NPR sent to GI

## 2021-11-05 ENCOUNTER — Telehealth: Payer: Self-pay | Admitting: Neurology

## 2021-11-05 DIAGNOSIS — G35 Multiple sclerosis: Secondary | ICD-10-CM

## 2021-11-05 NOTE — Telephone Encounter (Signed)
Please call patient, laboratory evaluation showed decreased absolute lymphocyte 0.3 x10 E3/uL. Will need closer monitoring of her lymphocyte count  I ordered repeat CBC, she can come in to office for lab around Feb 04, 2022.

## 2021-11-05 NOTE — Telephone Encounter (Signed)
I called and spoke with the patient to provide her with lab results. She verbalized understanding of the findings and expressed appreciation for the call. She was agreeable to coming in around 02/04/2022 for a repeat CBC.

## 2021-11-06 ENCOUNTER — Ambulatory Visit
Admission: RE | Admit: 2021-11-06 | Discharge: 2021-11-06 | Disposition: A | Discharge status: Home / Self Care | Payer: PPO | Source: Ambulatory Visit | Attending: Neurology | Admitting: Neurology

## 2021-11-06 DIAGNOSIS — G35 Multiple sclerosis: Secondary | ICD-10-CM | POA: Diagnosis not present

## 2021-11-06 DIAGNOSIS — M48061 Spinal stenosis, lumbar region without neurogenic claudication: Secondary | ICD-10-CM

## 2021-11-06 DIAGNOSIS — M21371 Foot drop, right foot: Secondary | ICD-10-CM

## 2021-11-06 MED ORDER — GADOBENATE DIMEGLUMINE 529 MG/ML IV SOLN
18.0000 mL | Freq: Once | INTRAVENOUS | Status: AC | PRN
  Administered 2021-11-06: 18 mL via INTRAVENOUS

## 2021-11-13 DIAGNOSIS — E785 Hyperlipidemia, unspecified: Secondary | ICD-10-CM | POA: Diagnosis not present

## 2021-11-13 DIAGNOSIS — I10 Essential (primary) hypertension: Secondary | ICD-10-CM | POA: Diagnosis not present

## 2021-11-20 ENCOUNTER — Ambulatory Visit (INDEPENDENT_AMBULATORY_CARE_PROVIDER_SITE_OTHER): Payer: PPO | Admitting: Pulmonary Disease

## 2021-11-20 ENCOUNTER — Encounter: Payer: Self-pay | Admitting: Pulmonary Disease

## 2021-11-20 DIAGNOSIS — J4489 Other specified chronic obstructive pulmonary disease: Secondary | ICD-10-CM | POA: Diagnosis not present

## 2021-11-20 DIAGNOSIS — J439 Emphysema, unspecified: Secondary | ICD-10-CM | POA: Diagnosis not present

## 2021-11-20 DIAGNOSIS — F17200 Nicotine dependence, unspecified, uncomplicated: Secondary | ICD-10-CM

## 2021-11-20 NOTE — Progress Notes (Signed)
Subjective:    Patient ID: Andrea Cantrell, female    DOB: [birth year], 71 y.o.   MRN: [id number]  HPI 71 year old smoker presents to establish care for COPD. She smokes about a pack per day starting as a teenager.  Surprisingly she quit from 1993 but started back in 2004 when she got married-about 30 pack years  She reports mild dyspnea on exertion and intermittent wheezing.  She has been diagnosed with "asthma" and has been taking Flovent for about a year and albuterol for many years.  She takes Mucinex and Claritin for "congestion".  She denies frequent chest colds.  Last use of prednisone was about a year ago. On exercise tolerance is more limited by right hip pain rather than dyspnea. She reports left-sided breast pain nonradiating, nonexertional which resolves with rubbing her breast  PMH -multiple sclerosis Hypertension   Past Medical History:  Diagnosis Date  . Arthritis   . Discoid lupus   . Essential hypertension   . Fibromyalgia   . Heart murmur   . Heavy smoker (more than 20 cigarettes per day)    Was able to stop for 11 years but restarted about 10 years ago  . Hyperlipidemia with target LDL less than 130   . MS (multiple sclerosis) ([place])   . Obesity (BMI 35.0-39.9 without comorbidity)   . Palpitations   . Pneumonia   . Spinal stenosis    Past Surgical History:  Procedure Laterality Date  . ABDOMINAL HYSTERECTOMY  1995  . BREAST SURGERY Left    tissue removal  . [place]  . CHOLECYSTECTOMY  1994  . INCISIONAL HERNIA REPAIR N/A 12/09/2016   Procedure: LAPAROSCOPIC INCISIONAL HERNIA REPAIR WITH MESH;  Surgeon: Excell Seltzer, MD;  Location: [facility];  Service: General;  Laterality: N/A;  . NM MYOVIEW LTD  October 2015   EF 71%. No ischemia or infarction. Low risk/normal  . ROTATOR CUFF REPAIR Left June 2016  . TOTAL HIP ARTHROPLASTY Right 08/09/2020   Procedure: TOTAL HIP ARTHROPLASTY ANTERIOR APPROACH;  Surgeon: Dorna Leitz, MD;   Location: [facility];  Service: Orthopedics;  Laterality: Right;  . TRANSTHORACIC ECHOCARDIOGRAM  07/2016    (To evaluate murmur).  Normal LV size and function.  EF 60-65%.  No RWMA.  Normal diastolic function. No comment on valvular disease. -- NORMAL ECHO    No Known Allergies  Social History   Socioeconomic History  . Marital status: Married    Spouse name: Not on file  . Number of children: 2  . Years of education: 12+  . Highest education level: Not on file  Occupational History  . Not on file  Tobacco Use  . Smoking status: Every Day    Packs/day: 1.00    Years: 17.00    Total pack years: 17.00    Types: Cigarettes  . Smokeless tobacco: Never  . Tobacco comments:    Pt states she smokes a pack a day. Verified 11/20/21  Vaping Use  . Vaping Use: Never used  Substance and Sexual Activity  . Alcohol use: No  . Drug use: No  . Sexual activity: Not on file  Other Topics Concern  . Not on file  Social History Narrative   Patient lives at home with her son.   Patient is separated. Patient has 2 children.    Patient has some college. Patient is on Disability.    Smokes roughly one pack a day. No alcohol.   Social Determinants of  Health   Financial Resource Strain: Not on file  Food Insecurity: Not on file  Transportation Needs: Not on file  Physical Activity: Not on file  Stress: Not on file  Social Connections: Not on file  Intimate Partner Violence: Not on file    Family History  Problem Relation Age of Onset  . Cancer Mother   . Cancer Father   . Diabetes Brother   . Diabetes Maternal Grandmother   . Multiple sclerosis Niece      Review of Systems Acid heartburn Irregular heartbeat Headaches off and on Earache Joint stiffness  Constitutional: negative for anorexia, fevers and sweats  Eyes: negative for irritation, redness and visual disturbance  Ears, nose, mouth, throat, and face: negative for earaches, epistaxis, nasal congestion and sore throat   Respiratory: negative for cough,  sputum and wheezing  Cardiovascular: negative for  lower extremity edema, orthopnea, palpitations and syncope  Gastrointestinal: negative for abdominal pain, constipation, diarrhea, melena, nausea and vomiting  Genitourinary:negative for dysuria, frequency and hematuria  Hematologic/lymphatic: negative for bleeding, easy bruising and lymphadenopathy  Musculoskeletal:negative for arthralgias, muscle weakness  Neurological: negative for coordination problems, gait problems and weakness  Endocrine: negative for diabetic symptoms including polydipsia, polyuria and weight loss     Objective:   Physical Exam  Gen. Pleasant, obese, in no distress, normal affect ENT - no pallor,icterus, no post nasal drip, class 2-3 airway Neck: No JVD, no thyromegaly, no carotid bruits Lungs: no use of accessory muscles, no dullness to percussion, decreased without rales or rhonchi  Cardiovascular: Rhythm regular, heart sounds  normal, no murmurs or gallops, no peripheral edema Abdomen: soft and non-tender, no hepatosplenomegaly, BS normal. Musculoskeletal: No deformities, no cyanosis or clubbing Neuro:  alert, non focal, no tremors       Assessment & Plan:

## 2021-11-20 NOTE — Assessment & Plan Note (Signed)
Discussed pathophysiology of COPD and effect of ongoing smoking. We will obtain PFTs. If she has significant airway obstruction, will consider substituting Flovent for LAMA/LABA combination. If not, we will continue Flovent and S ABA

## 2021-11-20 NOTE — Patient Instructions (Signed)
X schedule PFTs  X Lung cancer screening   We discussed using nicotine patches to QUIT smoking ! If that does not work, call us back for nicotine inhaler

## 2021-11-20 NOTE — Assessment & Plan Note (Signed)
Smoking cessation was discussed. She will try nicotine patches.  If this did not work we can try Nicotrol inhaler. I discussed lung cancer screening and she is willing to proceed.  We will refer to the screening program

## 2021-12-16 ENCOUNTER — Encounter (INDEPENDENT_AMBULATORY_CARE_PROVIDER_SITE_OTHER): Payer: Self-pay

## 2021-12-18 ENCOUNTER — Other Ambulatory Visit: Payer: Self-pay | Admitting: Neurology

## 2022-01-09 ENCOUNTER — Other Ambulatory Visit: Payer: Self-pay | Admitting: Neurology

## 2022-01-15 DIAGNOSIS — Z800 Family history of malignant neoplasm of digestive organs: Secondary | ICD-10-CM | POA: Diagnosis not present

## 2022-01-15 DIAGNOSIS — K5901 Slow transit constipation: Secondary | ICD-10-CM | POA: Diagnosis not present

## 2022-01-19 ENCOUNTER — Emergency Department (HOSPITAL_COMMUNITY)
Admission: EM | Admit: 2022-01-19 | Discharge: 2022-01-19 | Disposition: A | Discharge status: Home / Self Care | Payer: PPO | Attending: Emergency Medicine | Admitting: Emergency Medicine

## 2022-01-19 ENCOUNTER — Other Ambulatory Visit: Payer: Self-pay

## 2022-01-19 ENCOUNTER — Emergency Department (HOSPITAL_COMMUNITY): Payer: PPO

## 2022-01-19 DIAGNOSIS — M1712 Unilateral primary osteoarthritis, left knee: Secondary | ICD-10-CM | POA: Insufficient documentation

## 2022-01-19 DIAGNOSIS — R519 Headache, unspecified: Secondary | ICD-10-CM | POA: Diagnosis present

## 2022-01-19 DIAGNOSIS — I959 Hypotension, unspecified: Secondary | ICD-10-CM | POA: Diagnosis not present

## 2022-01-19 DIAGNOSIS — M25562 Pain in left knee: Secondary | ICD-10-CM

## 2022-01-19 DIAGNOSIS — Z7982 Long term (current) use of aspirin: Secondary | ICD-10-CM | POA: Insufficient documentation

## 2022-01-19 DIAGNOSIS — M79605 Pain in left leg: Secondary | ICD-10-CM | POA: Diagnosis not present

## 2022-01-19 DIAGNOSIS — S8992XA Unspecified injury of left lower leg, initial encounter: Secondary | ICD-10-CM | POA: Diagnosis not present

## 2022-01-19 MED ORDER — ACETAMINOPHEN 325 MG PO TABS
650.0000 mg | ORAL_TABLET | Freq: Once | ORAL | Status: AC
  Administered 2022-01-19: 650 mg via ORAL
  Filled 2022-01-19: qty 2
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
## 2022-01-19 NOTE — Progress Notes (Signed)
Orthopedic Tech Progress Note Patient Details:  Andrea Cantrell 1950/10/07 [id number]  Ortho Devices Type of Ortho Device: Knee Sleeve Ortho Device/Splint Location: Left knee Ortho Device/Splint Interventions: Application   Post Interventions Patient Tolerated: Well  Andrea Cantrell 01/19/2022, 5:37 PM
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
## 2022-01-23 DIAGNOSIS — Z23 Encounter for immunization: Secondary | ICD-10-CM | POA: Diagnosis not present

## 2022-01-23 DIAGNOSIS — M25562 Pain in left knee: Secondary | ICD-10-CM | POA: Diagnosis not present

## 2022-01-29 DIAGNOSIS — M25562 Pain in left knee: Secondary | ICD-10-CM | POA: Diagnosis not present

## 2022-02-02 DIAGNOSIS — N644 Mastodynia: Secondary | ICD-10-CM | POA: Diagnosis not present

## 2022-02-04 ENCOUNTER — Telehealth: Payer: Self-pay | Admitting: Neurology

## 2022-02-04 ENCOUNTER — Other Ambulatory Visit (INDEPENDENT_AMBULATORY_CARE_PROVIDER_SITE_OTHER): Payer: Self-pay

## 2022-02-04 ENCOUNTER — Telehealth: Payer: Self-pay

## 2022-02-04 DIAGNOSIS — Z0289 Encounter for other administrative examinations: Secondary | ICD-10-CM

## 2022-02-04 DIAGNOSIS — G35 Multiple sclerosis: Secondary | ICD-10-CM | POA: Diagnosis not present

## 2022-02-04 NOTE — Telephone Encounter (Signed)
JCV blood test collected and placed in pick up box at [facility].
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
## 2022-02-05 DIAGNOSIS — I10 Essential (primary) hypertension: Secondary | ICD-10-CM | POA: Diagnosis not present

## 2022-02-05 DIAGNOSIS — E785 Hyperlipidemia, unspecified: Secondary | ICD-10-CM | POA: Diagnosis not present

## 2022-02-05 LAB — CBC WITH DIFFERENTIAL/PLATELET
Basophils Absolute: 0 10*3/uL (ref 0.0–0.2)
Basos: 0 %
EOS (ABSOLUTE): 0.1 10*3/uL (ref 0.0–0.4)
Eos: 2 %
Hematocrit: 39.5 % (ref 34.0–46.6)
Hemoglobin: 13.1 g/dL (ref 11.1–15.9)
Immature Grans (Abs): 0 10*3/uL (ref 0.0–0.1)
Immature Granulocytes: 0 %
Lymphocytes Absolute: 0.4 10*3/uL — ABNORMAL LOW (ref 0.7–3.1)
Lymphs: 10 %
MCH: 27.2 pg (ref 26.6–33.0)
MCHC: 33.2 g/dL (ref 31.5–35.7)
MCV: 82 fL (ref 79–97)
Monocytes Absolute: 0.4 10*3/uL (ref 0.1–0.9)
Monocytes: 10 %
Neutrophils Absolute: 3.3 10*3/uL (ref 1.4–7.0)
Neutrophils: 78 %
Platelets: 402 10*3/uL (ref 150–450)
RBC: 4.81 x10E6/uL (ref 3.77–5.28)
RDW: 16 % — ABNORMAL HIGH (ref 11.7–15.4)
WBC: 4.2 10*3/uL (ref 3.4–10.8)

## 2022-02-05 NOTE — Telephone Encounter (Signed)
Please schedule pt for sooner appt with Dr Krista Blue or Story

## 2022-02-17 ENCOUNTER — Other Ambulatory Visit: Payer: PPO

## 2022-02-18 NOTE — Telephone Encounter (Signed)
Received results of JCV results showed Index Value of 1.70 and JCV Antibody Postive.   Results place in MD's office for review

## 2022-02-20 ENCOUNTER — Other Ambulatory Visit: Payer: Self-pay | Admitting: *Deleted

## 2022-02-20 ENCOUNTER — Ambulatory Visit (INDEPENDENT_AMBULATORY_CARE_PROVIDER_SITE_OTHER): Payer: PPO | Admitting: Adult Health

## 2022-02-20 ENCOUNTER — Encounter: Payer: Self-pay | Admitting: Adult Health

## 2022-02-20 VITALS — BP 110/70 | HR 64 | Temp 98.1°F | Ht 62.0 in | Wt 177.4 lb

## 2022-02-20 DIAGNOSIS — J4489 Other specified chronic obstructive pulmonary disease: Secondary | ICD-10-CM | POA: Diagnosis not present

## 2022-02-20 DIAGNOSIS — J439 Emphysema, unspecified: Secondary | ICD-10-CM

## 2022-02-20 DIAGNOSIS — Z716 Tobacco abuse counseling: Secondary | ICD-10-CM

## 2022-02-20 DIAGNOSIS — F17210 Nicotine dependence, cigarettes, uncomplicated: Secondary | ICD-10-CM | POA: Diagnosis not present

## 2022-02-20 DIAGNOSIS — F17200 Nicotine dependence, unspecified, uncomplicated: Secondary | ICD-10-CM

## 2022-02-20 NOTE — Assessment & Plan Note (Signed)
Smoking cessation discussed in detail 

## 2022-02-20 NOTE — Assessment & Plan Note (Signed)
Unfortunately unable to do spirometry today in the office.  Patient's PFT has not been set up.  We have ordered this again today at the office visit.  She has presumed COPD with ongoing smoking.  Encouraged on smoking cessation.  Will continue for Flovent for now.  Once have PFTs can to consider if escalating maintenance inhaler is indicated currently she is not too symptomatic  Plan  Patient Instructions  Refer to Lung cancer CT chest screening program .  Continue on Flovent 2 puffs Twice daily  rinse after use  Albuterol inhaler As needed   Work on not smoking  Follow up with Dr. Elsworth Soho  in 4 months with PFT and As needed

## 2022-02-20 NOTE — Patient Instructions (Addendum)
Refer to Lung cancer CT chest screening program .  Continue on Flovent 2 puffs Twice daily  rinse after use  Albuterol inhaler As needed   Work on not smoking  Follow up with Dr. Elsworth Soho  in 4 months with PFT and As needed
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
## 2022-02-20 NOTE — Progress Notes (Signed)
Received all the Pfizer vaccines except the last booster.

## 2022-02-20 NOTE — Progress Notes (Signed)
lun

## 2022-02-22 ENCOUNTER — Other Ambulatory Visit: Payer: Self-pay | Admitting: Neurology

## 2022-02-23 DIAGNOSIS — Z780 Asymptomatic menopausal state: Secondary | ICD-10-CM | POA: Diagnosis not present

## 2022-02-23 DIAGNOSIS — M85832 Other specified disorders of bone density and structure, left forearm: Secondary | ICD-10-CM | POA: Diagnosis not present

## 2022-02-24 NOTE — Telephone Encounter (Signed)
High titer Jc virus, on Vumerity

## 2022-03-02 DIAGNOSIS — M25562 Pain in left knee: Secondary | ICD-10-CM | POA: Diagnosis not present

## 2022-03-02 DIAGNOSIS — Z96641 Presence of right artificial hip joint: Secondary | ICD-10-CM | POA: Diagnosis not present

## 2022-03-02 DIAGNOSIS — M1712 Unilateral primary osteoarthritis, left knee: Secondary | ICD-10-CM | POA: Diagnosis not present

## 2022-03-09 DIAGNOSIS — K5730 Diverticulosis of large intestine without perforation or abscess without bleeding: Secondary | ICD-10-CM | POA: Diagnosis not present

## 2022-03-09 DIAGNOSIS — K649 Unspecified hemorrhoids: Secondary | ICD-10-CM | POA: Diagnosis not present

## 2022-03-09 DIAGNOSIS — Z09 Encounter for follow-up examination after completed treatment for conditions other than malignant neoplasm: Secondary | ICD-10-CM | POA: Diagnosis not present

## 2022-03-09 DIAGNOSIS — Z800 Family history of malignant neoplasm of digestive organs: Secondary | ICD-10-CM | POA: Diagnosis not present

## 2022-03-09 DIAGNOSIS — Z86010 Personal history of colonic polyps: Secondary | ICD-10-CM | POA: Diagnosis not present

## 2022-03-12 DIAGNOSIS — E785 Hyperlipidemia, unspecified: Secondary | ICD-10-CM | POA: Diagnosis not present

## 2022-03-12 DIAGNOSIS — I10 Essential (primary) hypertension: Secondary | ICD-10-CM | POA: Diagnosis not present

## 2022-03-16 DIAGNOSIS — F17200 Nicotine dependence, unspecified, uncomplicated: Secondary | ICD-10-CM | POA: Diagnosis not present

## 2022-03-16 DIAGNOSIS — I10 Essential (primary) hypertension: Secondary | ICD-10-CM | POA: Diagnosis not present

## 2022-03-16 DIAGNOSIS — G35 Multiple sclerosis: Secondary | ICD-10-CM | POA: Diagnosis not present

## 2022-03-16 DIAGNOSIS — L930 Discoid lupus erythematosus: Secondary | ICD-10-CM | POA: Diagnosis not present

## 2022-03-16 DIAGNOSIS — M5432 Sciatica, left side: Secondary | ICD-10-CM | POA: Diagnosis not present

## 2022-03-16 DIAGNOSIS — E785 Hyperlipidemia, unspecified: Secondary | ICD-10-CM | POA: Diagnosis not present

## 2022-03-16 DIAGNOSIS — F325 Major depressive disorder, single episode, in full remission: Secondary | ICD-10-CM | POA: Diagnosis not present

## 2022-03-16 DIAGNOSIS — I700 Atherosclerosis of aorta: Secondary | ICD-10-CM | POA: Diagnosis not present

## 2022-03-16 DIAGNOSIS — R7303 Prediabetes: Secondary | ICD-10-CM | POA: Diagnosis not present

## 2022-03-19 ENCOUNTER — Encounter: Payer: Self-pay | Admitting: Neurology

## 2022-03-19 ENCOUNTER — Telehealth: Payer: Self-pay | Admitting: Neurology

## 2022-03-19 ENCOUNTER — Ambulatory Visit: Payer: PPO | Admitting: Neurology

## 2022-03-19 VITALS — BP 120/65 | HR 72 | Ht 62.0 in | Wt 178.0 lb

## 2022-03-19 DIAGNOSIS — M48061 Spinal stenosis, lumbar region without neurogenic claudication: Secondary | ICD-10-CM

## 2022-03-19 DIAGNOSIS — M25562 Pain in left knee: Secondary | ICD-10-CM

## 2022-03-19 DIAGNOSIS — G8929 Other chronic pain: Secondary | ICD-10-CM

## 2022-03-19 DIAGNOSIS — R269 Unspecified abnormalities of gait and mobility: Secondary | ICD-10-CM

## 2022-03-19 DIAGNOSIS — G35 Multiple sclerosis: Secondary | ICD-10-CM

## 2022-03-19 DIAGNOSIS — M21371 Foot drop, right foot: Secondary | ICD-10-CM | POA: Diagnosis not present

## 2022-03-19 MED ORDER — DICLOFENAC SODIUM 1 % EX CREA: TOPICAL_CREAM | CUTANEOUS | 6 refills | Status: AC

## 2022-03-19 MED ORDER — GABAPENTIN 400 MG PO CAPS: 400.0000 mg | ORAL_CAPSULE | Freq: Four times a day (QID) | ORAL | 4 refills | Status: DC

## 2022-03-19 MED ORDER — DULOXETINE HCL 60 MG PO CPEP: 60.0000 mg | ORAL_CAPSULE | Freq: Every day | ORAL | 3 refills | Status: DC

## 2022-03-19 MED ORDER — BACLOFEN 10 MG PO TABS: 10.0000 mg | ORAL_TABLET | Freq: Four times a day (QID) | ORAL | 3 refills | Status: DC

## 2022-03-19 MED ORDER — LIDOCAINE-PRILOCAINE 2.5-2.5 % EX CREA: TOPICAL_CREAM | CUTANEOUS | 6 refills | Status: DC

## 2022-03-19 NOTE — Telephone Encounter (Signed)
Healthteam advantage NPR sent to [facility] [phone] 
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
## 2022-03-20 ENCOUNTER — Ambulatory Visit
Admission: RE | Admit: 2022-03-20 | Discharge: 2022-03-20 | Disposition: A | Discharge status: Home / Self Care | Payer: PPO | Source: Ambulatory Visit | Attending: Neurology | Admitting: Neurology

## 2022-03-20 ENCOUNTER — Telehealth: Payer: Self-pay | Admitting: Neurology

## 2022-03-20 DIAGNOSIS — G8929 Other chronic pain: Secondary | ICD-10-CM

## 2022-03-20 DIAGNOSIS — G35 Multiple sclerosis: Secondary | ICD-10-CM

## 2022-03-20 DIAGNOSIS — R269 Unspecified abnormalities of gait and mobility: Secondary | ICD-10-CM

## 2022-03-20 DIAGNOSIS — M48061 Spinal stenosis, lumbar region without neurogenic claudication: Secondary | ICD-10-CM

## 2022-03-20 DIAGNOSIS — M21371 Foot drop, right foot: Secondary | ICD-10-CM

## 2022-03-20 MED ORDER — VUMERITY 231 MG PO CPDR: DELAYED_RELEASE_CAPSULE | ORAL | 12 refills | Status: DC

## 2022-03-20 NOTE — Telephone Encounter (Signed)
Pt states she has been without her VUMERITY 231 MG CPDR  , for 3 days [phone] Pt concerned about being without this medication.  She is asking On Call to arrange this be sent to her as quickly as possible.

## 2022-03-20 NOTE — Telephone Encounter (Signed)
Meds ordered this encounter  Medications   VUMERITY 231 MG CPDR    Sig: TAKE 2 CAPSULES ('462MG'$ ) BY MOUTH TWICE DAILY.    Dispense:  120 capsule    Refill:  River Forest, MD 03/22/4693, 0:72 PM Certified in Neurology, Neurophysiology and Neuroimaging  [facility] [address], [place], [place] [address] [phone]

## 2022-03-23 DIAGNOSIS — E785 Hyperlipidemia, unspecified: Secondary | ICD-10-CM | POA: Diagnosis not present

## 2022-03-23 DIAGNOSIS — I10 Essential (primary) hypertension: Secondary | ICD-10-CM | POA: Diagnosis not present

## 2022-03-25 NOTE — Telephone Encounter (Signed)
  Refill was confirmed on 02.02.24, but she is asking if dosing should change due to not having for 8 days. Please advise. thanks

## 2022-03-25 NOTE — Telephone Encounter (Signed)
Please advise her to take Vumerity 231 mg capsule 1 twice a day for 7 days, then resume previous dosage of 2 capsule twice a day

## 2022-03-25 NOTE — Telephone Encounter (Signed)
Pt states today is day 8 without her medication, it is supposed to be delivered today.  Pt would like a call re:how she should take the medication since she has been without it now for over a week.

## 2022-03-26 NOTE — Telephone Encounter (Signed)
Called pt. Read her message that nurse Belenda Cruise sent in her mychart. Pt thank me for calling her.

## 2022-04-13 ENCOUNTER — Ambulatory Visit: Payer: PPO | Admitting: Neurology

## 2022-04-20 DIAGNOSIS — M1712 Unilateral primary osteoarthritis, left knee: Secondary | ICD-10-CM | POA: Diagnosis not present

## 2022-04-23 ENCOUNTER — Encounter: Payer: Self-pay | Admitting: Neurology

## 2022-04-23 DIAGNOSIS — E785 Hyperlipidemia, unspecified: Secondary | ICD-10-CM | POA: Diagnosis not present

## 2022-04-23 DIAGNOSIS — I10 Essential (primary) hypertension: Secondary | ICD-10-CM | POA: Diagnosis not present

## 2022-04-30 NOTE — Telephone Encounter (Signed)
I called patient, explained to her, JC virus titer 1.7 was positive, is most meaningful for patient taking Dwyane Dee, she is on Tecfidera, Vumerity, very low risk of PML,

## 2022-05-08 ENCOUNTER — Encounter: Payer: Self-pay | Admitting: Neurology

## 2022-05-11 ENCOUNTER — Ambulatory Visit (HOSPITAL_COMMUNITY)
Admission: EM | Admit: 2022-05-11 | Discharge: 2022-05-11 | Disposition: A | Discharge status: Home / Self Care | Payer: PPO

## 2022-05-11 ENCOUNTER — Encounter (HOSPITAL_COMMUNITY): Payer: Self-pay

## 2022-05-11 DIAGNOSIS — G35 Multiple sclerosis: Secondary | ICD-10-CM

## 2022-05-11 DIAGNOSIS — M5441 Lumbago with sciatica, right side: Secondary | ICD-10-CM | POA: Diagnosis not present

## 2022-05-11 DIAGNOSIS — J441 Chronic obstructive pulmonary disease with (acute) exacerbation: Secondary | ICD-10-CM | POA: Diagnosis not present

## 2022-05-11 MED ORDER — ALBUTEROL SULFATE HFA 108 (90 BASE) MCG/ACT IN AERS: 2.0000 | INHALATION_SPRAY | RESPIRATORY_TRACT | 0 refills | Status: AC | PRN

## 2022-05-11 MED ORDER — AZITHROMYCIN 250 MG PO TABS: 250.0000 mg | ORAL_TABLET | Freq: Every day | ORAL | 0 refills | Status: DC

## 2022-05-11 MED ORDER — DEXAMETHASONE SODIUM PHOSPHATE 10 MG/ML IJ SOLN
INTRAMUSCULAR | Status: AC
  Filled 2022-05-11: qty 1

## 2022-05-11 MED ORDER — DEXAMETHASONE SODIUM PHOSPHATE 10 MG/ML IJ SOLN
10.0000 mg | Freq: Once | INTRAMUSCULAR | Status: AC
  Administered 2022-05-11: 10 mg via INTRAMUSCULAR

## 2022-05-11 NOTE — ED Triage Notes (Addendum)
Pt c/o tighten from throat all the down to enter of upper abdomin since Thursday. C/o weakness and bilateral leg pain. Pt states feels like a MS flare up and COPD flare up. States taking her pain meds with no relief.

## 2022-05-11 NOTE — Discharge Instructions (Addendum)
Treatment for COPD exacerbation:  - dexamethasone shot in clinic to reduce inflammation. - albuterol inhaler as needed - azithromycin antibiotic as prescribed. - follow-up with PCP  Treatment for leg pain and sciatica: - dexamethasone injection in clinic to reduce inflammation and pain - continue taking prescribed medications (gabapentin, baclofen, tramadol, etc as prescribed) - heat and gentle range of motion exercises  Follow-up with PCP and neurology as scheduled.  Return to urgent care as needed. I hope you feel better! :)
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
## 2022-05-28 DIAGNOSIS — I10 Essential (primary) hypertension: Secondary | ICD-10-CM | POA: Diagnosis not present

## 2022-05-28 DIAGNOSIS — G35 Multiple sclerosis: Secondary | ICD-10-CM | POA: Diagnosis not present

## 2022-05-28 DIAGNOSIS — E785 Hyperlipidemia, unspecified: Secondary | ICD-10-CM | POA: Diagnosis not present

## 2022-05-29 ENCOUNTER — Telehealth: Payer: Self-pay | Admitting: Pulmonary Disease

## 2022-05-29 NOTE — Telephone Encounter (Signed)
Florentina Addison would like patient to have RX for Anoro. Would like Dr, Vassie Loll advice. Katie phone number is [phone].

## 2022-06-01 NOTE — Telephone Encounter (Signed)
ATC katie. Was on hold for 10 minutes. Will try to call again.

## 2022-06-01 NOTE — Telephone Encounter (Signed)
RA, please advise.  

## 2022-06-02 NOTE — Progress Notes (Signed)
Cardiology Office Note:    Date:  06/08/2022   ID:  Andrea Cantrell, DOB [birth year], MRN [id number]  PCP:  Laurann Montana, MD  Cardiologist:  Bryan Lemma, MD  Electrophysiologist:  None   Referring MD: Laurann Montana, MD   Chief Complaint: routine follow-up of dyspnea on exertion and palpitations   History of Present Illness:    Andrea Cantrell is a 72 y.o. female with a history of chronic dyspnea on exertion, palpitations, aortic atherosclerosis, hypertension, hyperlipidemia, presumed COPD, spinal stenosis, multiple sclerosis, fibromyalgia, discoid lupus, obesity, and tobacco abuse who is followed by Dr. Herbie Baltimore and presents today for routine follow-up.   Patient was referred to Dr. Herbie Baltimore in 11/2013 for further evaluation of left sided chest pain and dyspnea on exertion. Lexiscan Myoview as ordered for further evaluation and was low risk with no evidence of ischemia or infarct. Echo in 07/2016 for further evaluation of persistent dyspnea on exertion showed LVEF of 60-65% with normal wall motion. Dyspnea was ultimately felt to be secondary to obesity and deconditioning. She was last seen by Dr. Herbie Baltimore in 05/2021 at which time she continued to report brief episodes of palpitations as well as dyspnea on exertion both of which sounded stable. She also reported occasionally pain in left breast usually after smoking but also at rest and when laying down. No exertional pain. This was not felt to be cardiac in nature and no additional cardiac work-up was ordered.   She was seen at an Urgent Care on 05/11/2022 for leg pain felt to be due to acute exacerbation of sciatic nerve radicular pain and pleuritic abdominal and chest pain with associated cough and wheezing felt to be secondary to acute COPD exacerbation. She was treated with a dose of Dexamethasone and Azithromycin.   Patient presents today for follow-up. She continues to have brief episodes of palpitations that she describes as a feeling of   "heart turning over." She states she will have these intermittently throughout the day about twice a week (this is better than it was a couple of months). She also reports some nagging left sided chest pain that is sometimes related to the palpitations but not always. She only notices this pain if she lays down or lays still. No exertional chest pain. She states it last for a couple of seconds at a time but may come or go for a while. She states she has been experiencing this chest discomfort for the last couple of months. She has some chronic shortness of breath with exertion but this is stable. She has presumed COPD and now follows with Pulmonology. No shortness of breath at rest unless she is smoking. No orthopnea, PND, or edema.  She stopped smoking for about 3 weeks at the end of March but then unfortunately starting smoking gain earlier this month after being around people who were smoking. She is currently smoking 1 to 1.5 packs per day. She also admits to drinking a lot of caffeine. She states she drink 4-5 cups of coffee throughout the day with 7-9 pack of Equal in each cup. She tried cutting back on her caffeine but did not notice any significant improvement in her palpitations with this.   Past Medical History:  Diagnosis Date   Arthritis    Discoid lupus    Essential hypertension    Fibromyalgia    Heart murmur    Heavy smoker (more than 20 cigarettes per day)    Was able to stop for 11 years  but restarted about 10 years ago   Hyperlipidemia with target LDL less than 130    MS (multiple sclerosis)    Obesity (BMI 35.0-39.9 without comorbidity)    Palpitations    Pneumonia    Spinal stenosis     Past Surgical History:  Procedure Laterality Date   ABDOMINAL HYSTERECTOMY  1995   BREAST SURGERY Left    tissue removal   CESAREAN SECTION  1968 & 1971   CHOLECYSTECTOMY  1994   INCISIONAL HERNIA REPAIR N/A 12/09/2016   Procedure: LAPAROSCOPIC INCISIONAL HERNIA REPAIR WITH MESH;   Surgeon: Glenna Fellows, MD;  Location: [facility];  Service: General;  Laterality: N/A;   NM MYOVIEW LTD  October 2015   EF 71%. No ischemia or infarction. Low risk/normal   ROTATOR CUFF REPAIR Left June 2016   TOTAL HIP ARTHROPLASTY Right 08/09/2020   Procedure: TOTAL HIP ARTHROPLASTY ANTERIOR APPROACH;  Surgeon: Jodi Geralds, MD;  Location: [facility];  Service: Orthopedics;  Laterality: Right;   TRANSTHORACIC ECHOCARDIOGRAM  07/2016    (To evaluate murmur).  Normal LV size and function.  EF 60-65%.  No RWMA.  Normal diastolic function. No comment on valvular disease. -- NORMAL ECHO    Current Medications: Current Meds  Medication Sig   albuterol (VENTOLIN HFA) 108 (90 Base) MCG/ACT inhaler Inhale 2 puffs into the lungs every 4 (four) hours as needed for wheezing or shortness of breath. For shortness of breath.   amLODipine (NORVASC) 5 MG tablet Take 5 mg by mouth every morning.    ASPIRIN 81 PO Take 81 mg by mouth daily.   atorvastatin (LIPITOR) 10 MG tablet Take 10 mg by mouth every morning.    baclofen (LIORESAL) 10 MG tablet Take 1 tablet (10 mg total) by mouth 4 (four) times daily.   Calcium Carbonate (CALCIUM 600 PO) Take 600 mg by mouth in the morning and at bedtime.   CELEBREX 100 MG capsule Take 100 mg by mouth 2 (two) times daily.   cholecalciferol (VITAMIN D) 25 MCG (1000 UNIT) tablet Take 1,000 Units by mouth daily.   Diclofenac Sodium 1 % CREA 1 gram qid prn   diphenhydramine-acetaminophen (TYLENOL PM) 25-500 MG TABS tablet Take 1 tablet by mouth at bedtime.   docusate sodium (COLACE) 100 MG capsule Take 1 capsule (100 mg total) by mouth 2 (two) times daily.   DULoxetine (CYMBALTA) 60 MG capsule Take 1 capsule (60 mg total) by mouth daily.   FLOVENT HFA 110 MCG/ACT inhaler Inhale 2 puffs into the lungs in the morning and at bedtime.   gabapentin (NEURONTIN) 400 MG capsule Take 1 capsule (400 mg total) by mouth 4 (four) times daily.   loratadine (CLARITIN) 10 MG tablet Take 10 mg  by mouth daily as needed for allergies.   metoprolol tartrate (LOPRESSOR) 50 MG tablet Take 2 hours prior to procedure   polyethylene glycol (MIRALAX / GLYCOLAX) packet Uses as needed for constipation at home.  He can buy this over-the-counter at any drugstore.  Follow package instructions.   pseudoephedrine-guaifenesin (MUCINEX D) 60-600 MG 12 hr tablet Take 1 tablet by mouth 2 (two) times daily as needed for congestion.   traMADol (ULTRAM) 50 MG tablet Take 50 mg by mouth 2 (two) times daily as needed.   valACYclovir (VALTREX) 500 MG tablet Take 500 mg by mouth daily.   VUMERITY 231 MG CPDR TAKE 2 CAPSULES (462MG ) BY MOUTH TWICE DAILY.     Allergies:   Patient has no known allergies.  Social History   Socioeconomic History   Marital status: Married    Spouse name: Not on file   Number of children: 2   Years of education: 12+   Highest education level: Not on file  Occupational History   Not on file  Tobacco Use   Smoking status: Some Days    Packs/day: 1.00    Years: 17.00    Additional pack years: 0.00    Total pack years: 17.00    Types: Cigarettes   Smokeless tobacco: Never   Tobacco comments:    Went 3 days without smoking while fasting.  Verified 02/20/21 hfb  Vaping Use   Vaping Use: Never used  Substance and Sexual Activity   Alcohol use: No   Drug use: No   Sexual activity: Not on file  Other Topics Concern   Not on file  Social History Narrative   Patient lives at home with her son.   Patient is separated. Patient has 2 children.    Patient has some college. Patient is on Disability.    Smokes roughly one pack a day. No alcohol.   Social Determinants of Health   Financial Resource Strain: Not on file  Food Insecurity: Not on file  Transportation Needs: Not on file  Physical Activity: Not on file  Stress: Not on file  Social Connections: Not on file     Family History: The patient's family history includes Cancer in her father and mother; Diabetes in  her brother and maternal grandmother; Multiple sclerosis in her niece.  ROS:   Please see the history of present illness.     EKGs/Labs/Other Studies Reviewed:    The following studies were reviewed:  Myoview 11/28/2013: Impression Exercise Capacity:  Lexiscan with no exercise. BP Response:  Normal blood pressure response. Clinical Symptoms:  No significant symptoms noted. ECG Impression:  No significant ECG changes with Lexiscan. Comparison with Prior Nuclear Study: No images to compare   Overall Impression:  Normal stress nuclear study. and Low risk stress nuclear study with no evidence of Ischemia or Infarction.. Normal LV Function _______________  Echocardiogram 07/29/2016: Study Conclusions: - Left ventricle: The cavity size was normal. Wall thickness was    normal. Systolic function was normal. The estimated ejection    fraction was in the range of 60% to 65%. Wall motion was normal;    there were no regional wall motion abnormalities. There was no    evidence of elevated ventricular filling pressure by Doppler    parameters.  - Left atrium: The atrium was at the upper limits of normal in    size. Volume/bsa, ES, (1-plane Simpson&'s, A2C): 32.1 ml/m^2.  - Tricuspid valve: There was trivial regurgitation.    EKG:  EKG ordered today. EKG personally reviewed and demonstrates normal sinus rhythm, rate 68 bpm, with no acute ST/T changes. Normal axis. Normal PR and QRS intervals. QTc 423 ms.  Recent Labs: 11/03/2021: TSH 0.747 02/04/2022: Hemoglobin 13.1; Platelets 402  Recent Lipid Panel    Component Value Date/Time   CHOL 172 05/23/2021 0824   TRIG 47 05/23/2021 0824   HDL 109 05/23/2021 0824   CHOLHDL 1.6 05/23/2021 0824   LDLCALC 53 05/23/2021 0824    Physical Exam:    Vital Signs: BP 130/76   Pulse 68   Ht 5\' 2"  (1.575 m)   Wt 179 lb 12.8 oz (81.6 kg)   SpO2 95%   BMI 32.89 kg/m     Wt Readings from Last 3 Encounters:  06/08/22 179 lb 12.8 oz (81.6 kg)   03/19/22 178 lb (80.7 kg)  02/20/22 177 lb 6.4 oz (80.5 kg)     General: 72 y.o. obese African-American female in no acute distress. HEENT: Normocephalic and atraumatic. Sclera clear.  Neck: Supple. No carotid bruits. No JVD. Heart: RRR. Distinct S1 and S2. No murmurs, gallops, or rubs. Radial pulses 2+ and equal bilaterally. Lungs: No increased work of breathing. Clear to ausculation bilaterally. No wheezes, rhonchi, or rales.  Abdomen: Soft, non-distended, and non-tender to palpation.  Extremities: No lower extremity edema.    Skin: Warm and dry. Neuro: Alert and oriented x3. No focal deficits. Psych: Normal affect. Responds appropriately.  Assessment:    1. Dyspnea on exertion   2. Palpitations   3. Aortic atherosclerosis   4. Primary hypertension   5. Hyperlipidemia, unspecified hyperlipidemia type   6. Precordial pain   7. Pre-procedure lab exam     Plan:    Atypical Chest Pain Patient describes new atypical chest pain over the last couple of months that occurs at rest. No exertion pain.  - EKG shows no acute ischemic changes. - Symptoms sound very atypical. However, patient is concerned. She does have multiple CV risk factors including HTN, HLD, known aortic atherosclerosis, and a long smoking history. Therefore, will order a coronary CTA. Will provide a one time dose of Lopresor  for patient to take 90-120 minutes prior to study. Will check BMET today in anticipation of this.  Chronic Dyspnea on Exertion Patient has a long history of chronic dyspnea on exertion. Prior cardiac work-up has been unremarkable. Myoview in 2015 was low risk with no evidence of ischemia or infarction. Echo in 2018 showed LVEF of 60-65% and no significant valvular disease. - Stable. No other signs of symptoms of CHF. - Dyspnea has been felt to be secondary to deconditioning and obesity. She also has presumed COPD and is now being followed by Pulmonology. I don't think her dyspnea is cardiac  in nature but am getting a coronary CTA as above given atypical chest pain and multiple CV risk factors.   Palpitations Patient has a long history of intermittent brief palpitations.  - Overall stable. - Recommended decreasing caffeine intake.   Aortic Atherosclerosis Noted on prior CT of lumbar spine. - Continue aspirin and statin.   Hypertension BP well controlled.  - Continue Amlodipine  daily.   Hyperlipidemia Lipid panel on 05/23/2022 (per [facility]): Total Cholesterol 99, Triglycerides 71, HDL 50, LDL 35. LDL <70 given known aortic atherosclerosis.  - Continue Lipitor  daily.   Tobacco Abuse Patient stopped smoking for about 3 weeks at the end of March but then unfortunately starting smoking gain earlier this month after being around people who were smoking. She is currently smoking 1 to 1.5 packs per day. - Discussed importance of complete cessation and encouraged her to try quitting again. Offered Nicotine patches/ gum but she declined.   Disposition: Follow up in 1 year. If coronary CTA is abnormal, will need to be seen sooner.    Medication Adjustments/Labs and Tests Ordered: Current medicines are reviewed at length with the patient today.  Concerns regarding medicines are outlined above.  Orders Placed This Encounter  Procedures   CT CORONARY MORPH W/CTA COR W/SCORE W/CA W/CM &/OR WO/CM   Basic metabolic panel   EKG 12-Lead   Meds ordered this encounter  Medications   metoprolol tartrate (LOPRESSOR) 50 MG tablet    Sig: Take 2 hours prior to procedure  Dispense:  1 tablet    Refill:  0    Patient Instructions  Medication Instructions:   TAKE Metoprolol Tartrate (Lopressor) 50 mg tablet 2 hours prior to Coronary CT test   *If you need a refill on your cardiac medications before your next appointment, please call your pharmacy*  Lab Work: Your physician recommends that you to have lab work TODAY:  BMP  If you have labs (blood work) drawn today and your  tests are completely normal, you will receive your results only by: MyChart Message (if you have MyChart) OR A paper copy in the mail If you have any lab test that is abnormal or we need to change your treatment, we will call you to review the results.  Testing/Procedures: Your physician has requested that you have cardiac CT. Cardiac CT Angiography (CTA), is a special type of CT scan that uses a computer to produce multi-dimensional views of major blood vessels throughout the body. In CT angiography, a contrast material is injected through an IV to help visualize the blood vessels. A cardiac CT angiogram is a procedure to look at the heart and the area around the heart. It may be done to help find the cause of chest pains or other symptoms of heart disease. During this procedure, a substance called contrast dye is injected into a vein in the arm. The contrast highlights the blood vessels in the area to be checked. A large X-ray machine (CT scanner), then takes detailed pictures of the heart and the surrounding area. The procedure is also sometimes called a coronary CT angiogram, coronary artery scanning, or CTA.  Follow-Up: At [facility], you and your health needs are our priority.  As part of our continuing mission to provide you with exceptional heart care, we have created designated Provider Care Teams.  These Care Teams include your primary Cardiologist (physician) and Advanced Practice Providers (APPs -  Physician Assistants and Nurse Practitioners) who all work together to provide you with the care you need, when you need it.  Your next appointment:   1 year(s)  Provider:   Bryan Lemma, MD  or Marjie Skiff, PA-C        Other Instructions     Your cardiac CT will be scheduled at one of the below locations:   [facility] [address] [place], [place] [address] [phone]  OR  [facility] [address] [place], [place] [address] [phone]  OR   [facility] [address] [place], [place] [address] [phone]  If scheduled at [facility], please arrive at the [facility] (Entrance C2) of [facility] 30 minutes prior to test start time. You can use the FREE valet parking offered at entrance C (encouraged to control the heart rate for the test)  Proceed to the [facility] Radiology Department (first floor) to check-in and test prep.  All radiology patients and guests should use entrance C2 at [facility], accessed from [facility], even though the hospital's physical address listed is [address]    If scheduled at [facility] or [facility], please arrive 15 mins early for check-in and test prep.   Please follow these instructions carefully (unless otherwise directed):  On the Night Before the Test: Be sure to Drink plenty of water. Do not consume any caffeinated/decaffeinated beverages or chocolate 12 hours prior to  your test. Do not take any antihistamines 12 hours prior to your test. If the patient has contrast allergy: Patient will need a prescription for Prednisone and very clear instructions (as follows): Prednisone 50 mg - take 13 hours prior to test Take another Prednisone 50 mg 7 hours prior to test Take another Prednisone 50 mg 1 hour prior to test Take Benadryl 50 mg 1 hour prior to test Patient must complete all four doses of above prophylactic medications. Patient will need a ride after test due to Benadryl.  On the Day of the Test: Drink plenty of water until 1 hour prior to the test. Do not eat any food 1 hour prior to test. You may take your regular medications prior to the test.  Take metoprolol (Lopressor) two hours prior to test. If you take Furosemide/Hydrochlorothiazide/Spironolactone, please HOLD on  the morning of the test. FEMALES- please wear underwire-free bra if available, avoid dresses & tight clothing       After the Test: Drink plenty of water. After receiving IV contrast, you may experience a mild flushed feeling. This is normal. On occasion, you may experience a mild rash up to 24 hours after the test. This is not dangerous. If this occurs, you can take Benadryl 25 mg and increase your fluid intake. If you experience trouble breathing, this can be serious. If it is severe call 911 IMMEDIATELY. If it is mild, please call our office. If you take any of these medications: Glipizide/Metformin, Avandament, Glucavance, please do not take 48 hours after completing test unless otherwise instructed.  We will call to schedule your test 2-4 weeks out understanding that some insurance companies will need an authorization prior to the service being performed.   For non-scheduling related questions, please contact the cardiac imaging nurse navigator should you have any questions/concerns: Rockwell Alexandria, Cardiac Imaging Nurse Navigator Larey Brick, Cardiac Imaging Nurse Navigator [facility] Direct Office Dial: [phone]   For scheduling needs, including cancellations and rescheduling, please call [place], [phone].     Signed, Corrin Parker, PA-C  06/08/2022 5:50 PM     [facility]

## 2022-06-04 ENCOUNTER — Telehealth: Payer: Self-pay | Admitting: Pulmonary Disease

## 2022-06-04 NOTE — Telephone Encounter (Signed)
[facility] called to ask the doctor if he could recommend something for the patient's breathing that she could use on hand until her appt. 5/9.  Please advise and call back to discuss.  CB# [phone]

## 2022-06-04 NOTE — Telephone Encounter (Signed)
Called and spoke with Florentina Addison from [facility] and let her know the info per Dr. Vassie Loll and she verbalized understanding. Nothing further needed.

## 2022-06-08 ENCOUNTER — Encounter: Payer: Self-pay | Admitting: Student

## 2022-06-08 ENCOUNTER — Ambulatory Visit: Payer: PPO | Attending: Student | Admitting: Student

## 2022-06-08 VITALS — BP 130/76 | HR 68 | Ht 62.0 in | Wt 179.8 lb

## 2022-06-08 DIAGNOSIS — R0609 Other forms of dyspnea: Secondary | ICD-10-CM | POA: Diagnosis not present

## 2022-06-08 DIAGNOSIS — R072 Precordial pain: Secondary | ICD-10-CM

## 2022-06-08 DIAGNOSIS — Z01812 Encounter for preprocedural laboratory examination: Secondary | ICD-10-CM | POA: Diagnosis not present

## 2022-06-08 DIAGNOSIS — R002 Palpitations: Secondary | ICD-10-CM | POA: Diagnosis not present

## 2022-06-08 DIAGNOSIS — I10 Essential (primary) hypertension: Secondary | ICD-10-CM | POA: Diagnosis not present

## 2022-06-08 DIAGNOSIS — E785 Hyperlipidemia, unspecified: Secondary | ICD-10-CM

## 2022-06-08 DIAGNOSIS — I700 Atherosclerosis of aorta: Secondary | ICD-10-CM | POA: Diagnosis not present

## 2022-06-08 DIAGNOSIS — M7061 Trochanteric bursitis, right hip: Secondary | ICD-10-CM | POA: Diagnosis not present

## 2022-06-08 MED ORDER — METOPROLOL TARTRATE 50 MG PO TABS: ORAL_TABLET | ORAL | 0 refills | Status: DC

## 2022-06-08 NOTE — Patient Instructions (Signed)
Medication Instructions:   TAKE Metoprolol Tartrate (Lopressor) 50 mg tablet 2 hours prior to Coronary CT test   *If you need a refill on your cardiac medications before your next appointment, please call your pharmacy*  Lab Work: Your physician recommends that you to have lab work TODAY:  BMP  If you have labs (blood work) drawn today and your tests are completely normal, you will receive your results only by: MyChart Message (if you have MyChart) OR A paper copy in the mail If you have any lab test that is abnormal or we need to change your treatment, we will call you to review the results.  Testing/Procedures: Your physician has requested that you have cardiac CT. Cardiac CT Angiography (CTA), is a special type of CT scan that uses a computer to produce multi-dimensional views of major blood vessels throughout the body. In CT angiography, a contrast material is injected through an IV to help visualize the blood vessels. A cardiac CT angiogram is a procedure to look at the heart and the area around the heart. It may be done to help find the cause of chest pains or other symptoms of heart disease. During this procedure, a substance called contrast dye is injected into a vein in the arm. The contrast highlights the blood vessels in the area to be checked. A large X-ray machine (CT scanner), then takes detailed pictures of the heart and the surrounding area. The procedure is also sometimes called a coronary CT angiogram, coronary artery scanning, or CTA.  Follow-Up: At [facility], you and your health needs are our priority.  As part of our continuing mission to provide you with exceptional heart care, we have created designated Provider Care Teams.  These Care Teams include your primary Cardiologist (physician) and Advanced Practice Providers (APPs -  Physician Assistants and Nurse Practitioners) who all work together to provide you with the care you need, when you need it.  Your next  appointment:   1 year(s)  Provider:   Bryan Lemma, MD  or Marjie Skiff, PA-C        Other Instructions     Your cardiac CT will be scheduled at one of the below locations:   [facility] [address] [place], [place] [address] [phone]  OR  [facility] [address] [place], [place] [address] [phone]  OR   [facility] [address] [place], [place] [address] [phone]  If scheduled at [facility], please arrive at the [facility] and Children's Entrance (Entrance C2) of [facility] 30 minutes prior to test start time. You can use the FREE valet parking offered at entrance C (encouraged to control the heart rate for the test)  Proceed to the [facility] Radiology Department (first floor) to check-in and test prep.  All radiology patients and guests should use entrance C2 at [facility], accessed from [facility], even though the hospital's physical address listed is [address]    If scheduled at [facility] or [facility], please arrive 15 mins early for check-in and test prep.   Please follow these instructions carefully (unless otherwise directed):  On the Night Before the Test: Be sure to Drink plenty of water. Do not consume any caffeinated/decaffeinated beverages or chocolate 12 hours prior to your test. Do not take any antihistamines 12 hours prior to your test. If the patient has  contrast allergy: Patient will need a prescription for Prednisone and very clear instructions (as follows): Prednisone 50 mg - take 13 hours prior to test Take another Prednisone 50 mg 7 hours prior to test Take another Prednisone 50 mg 1 hour prior to test Take Benadryl 50 mg 1 hour prior to test Patient must complete all four doses of above prophylactic medications. Patient will need a  ride after test due to Benadryl.  On the Day of the Test: Drink plenty of water until 1 hour prior to the test. Do not eat any food 1 hour prior to test. You may take your regular medications prior to the test.  Take metoprolol (Lopressor) two hours prior to test. If you take Furosemide/Hydrochlorothiazide/Spironolactone, please HOLD on the morning of the test. FEMALES- please wear underwire-free bra if available, avoid dresses & tight clothing       After the Test: Drink plenty of water. After receiving IV contrast, you may experience a mild flushed feeling. This is normal. On occasion, you may experience a mild rash up to 24 hours after the test. This is not dangerous. If this occurs, you can take Benadryl 25 mg and increase your fluid intake. If you experience trouble breathing, this can be serious. If it is severe call 911 IMMEDIATELY. If it is mild, please call our office. If you take any of these medications: Glipizide/Metformin, Avandament, Glucavance, please do not take 48 hours after completing test unless otherwise instructed.  We will call to schedule your test 2-4 weeks out understanding that some insurance companies will need an authorization prior to the service being performed.   For non-scheduling related questions, please contact the cardiac imaging nurse navigator should you have any questions/concerns: Rockwell Alexandria, Cardiac Imaging Nurse Navigator Larey Brick, Cardiac Imaging Nurse Navigator [facility] Direct Office Dial: [phone]   For scheduling needs, including cancellations and rescheduling, please call [place], [phone].

## 2022-06-09 LAB — BASIC METABOLIC PANEL
BUN/Creatinine Ratio: 22 (ref 12–28)
BUN: 13 mg/dL (ref 8–27)
CO2: 23 mmol/L (ref 20–29)
Calcium: 9.7 mg/dL (ref 8.7–10.3)
Chloride: 103 mmol/L (ref 96–106)
Creatinine, Ser: 0.6 mg/dL (ref 0.57–1.00)
Glucose: 92 mg/dL (ref 70–99)
Potassium: 4.8 mmol/L (ref 3.5–5.2)
Sodium: 142 mmol/L (ref 134–144)
eGFR: 95 mL/min/{1.73_m2} (ref >59)

## 2022-06-09 MED ORDER — ANORO ELLIPTA 62.5-25 MCG/ACT IN AEPB: 1.0000 | INHALATION_SPRAY | Freq: Every day | RESPIRATORY_TRACT | 5 refills | Status: AC

## 2022-06-09 NOTE — Telephone Encounter (Signed)
Anoro ordered, contacted [facility] management team to see which is preferred pharmacy. Andrea Cantrell states [facility]

## 2022-06-09 NOTE — Telephone Encounter (Signed)
Please advise 

## 2022-06-18 ENCOUNTER — Other Ambulatory Visit: Payer: Self-pay | Admitting: Neurology

## 2022-06-19 DIAGNOSIS — E785 Hyperlipidemia, unspecified: Secondary | ICD-10-CM | POA: Diagnosis not present

## 2022-06-19 DIAGNOSIS — I10 Essential (primary) hypertension: Secondary | ICD-10-CM | POA: Diagnosis not present

## 2022-06-23 DIAGNOSIS — M5441 Lumbago with sciatica, right side: Secondary | ICD-10-CM | POA: Diagnosis not present

## 2022-06-23 DIAGNOSIS — M1712 Unilateral primary osteoarthritis, left knee: Secondary | ICD-10-CM | POA: Diagnosis not present

## 2022-06-25 ENCOUNTER — Ambulatory Visit (INDEPENDENT_AMBULATORY_CARE_PROVIDER_SITE_OTHER): Payer: PPO | Admitting: Pulmonary Disease

## 2022-06-25 ENCOUNTER — Ambulatory Visit (HOSPITAL_BASED_OUTPATIENT_CLINIC_OR_DEPARTMENT_OTHER): Payer: PPO | Admitting: Pulmonary Disease

## 2022-06-25 ENCOUNTER — Encounter (HOSPITAL_BASED_OUTPATIENT_CLINIC_OR_DEPARTMENT_OTHER): Payer: Self-pay | Admitting: Pulmonary Disease

## 2022-06-25 ENCOUNTER — Telehealth (HOSPITAL_COMMUNITY): Payer: Self-pay | Admitting: Emergency Medicine

## 2022-06-25 VITALS — BP 134/60 | HR 74 | Temp 97.8°F | Ht 62.0 in | Wt 179.4 lb

## 2022-06-25 DIAGNOSIS — J4489 Other specified chronic obstructive pulmonary disease: Secondary | ICD-10-CM

## 2022-06-25 DIAGNOSIS — J439 Emphysema, unspecified: Secondary | ICD-10-CM

## 2022-06-25 DIAGNOSIS — F17200 Nicotine dependence, unspecified, uncomplicated: Secondary | ICD-10-CM | POA: Diagnosis not present

## 2022-06-25 LAB — PULMONARY FUNCTION TEST
DL/VA % pred: 131 %
DL/VA: 5.52 ml/min/mmHg/L
DLCO cor % pred: 111 %
DLCO cor: 20.17 ml/min/mmHg
DLCO unc % pred: 111 %
DLCO unc: 20.17 ml/min/mmHg
FEF 25-75 Post: 2.99 L/sec
FEF 25-75 Pre: 3.36 L/sec
FEF2575-%Change-Post: -11 %
FEF2575-%Pred-Post: 176 %
FEF2575-%Pred-Pre: 198 %
FEV1-%Change-Post: 2 %
FEV1-%Pred-Post: 91 %
FEV1-%Pred-Pre: 89 %
FEV1-Post: 1.85 L
FEV1-Pre: 1.81 L
FEV1FVC-%Change-Post: -6 %
FEV1FVC-%Pred-Pre: 118 %
FEV6-%Change-Post: 15 %
FEV6-%Pred-Post: 86 %
FEV6-%Pred-Pre: 74 %
FEV6-Post: 2.2 L
FEV6-Pre: 1.91 L
FEV6FVC-%Pred-Post: 105 %
FEV6FVC-%Pred-Pre: 105 %
FVC-%Change-Post: 9 %
FVC-%Pred-Post: 82 %
FVC-%Pred-Pre: 75 %
FVC-Post: 2.2 L
FVC-Pre: 2.02 L
Post FEV1/FVC ratio: 84 %
Post FEV6/FVC ratio: 100 %
Pre FEV1/FVC ratio: 90 %
Pre FEV6/FVC Ratio: 100 %
RV % pred: 80 %
RV: 1.71 L
TLC % pred: 85 %
TLC: 4.06 L

## 2022-06-25 NOTE — Progress Notes (Signed)
   Subjective:    Patient ID: Andrea Cantrell, female    DOB: 03-08-1950, 72 y.o.   MRN: [id number]  HPI  72 yo  smoker  for FU of COPD. She smokes about a pack per day starting as a teenager.  Surprisingly she quit from 1993 but started back in 2004 when she got married-about 30 pack years  PMH -multiple sclerosis Hypertension  36-month follow-up visit. She continues to smoke about half pack per day.  She was able to quit for 2 weeks but has relapsed. She denies significant dyspnea or wheezing or frequent chest colds.  Flovent is no longer covered by insurance.  Primary switch her to [facility] but she only uses this on an as needed basis  Significant tests/ events reviewed PFTs 06/2022 normal  Review of Systems neg for any significant sore throat, dysphagia, itching, sneezing, nasal congestion or excess/ purulent secretions, fever, chills, sweats, unintended wt loss, pleuritic or exertional cp, hempoptysis, orthopnea pnd or change in chronic leg swelling. Also denies presyncope, palpitations, heartburn, abdominal pain, nausea, vomiting, diarrhea or change in bowel or urinary habits, dysuria,hematuria, rash, arthralgias, visual complaints, headache, numbness weakness or ataxia.     Objective:   Physical Exam  Gen. Pleasant, obese, in no distress ENT - no lesions, no post nasal drip Neck: No JVD, no thyromegaly, no carotid bruits Lungs: no use of accessory muscles, no dullness to percussion, decreased without rales or rhonchi  Cardiovascular: Rhythm regular, heart sounds  normal, no murmurs or gallops, no peripheral edema Musculoskeletal: No deformities, no cyanosis or clubbing , no tremors       Assessment & Plan:

## 2022-06-25 NOTE — Assessment & Plan Note (Signed)
We reviewed PFTs and surprisingly this does not show significant airway obstruction. She is only using Anoro as needed and have asked her to discontinue. Flovent is very expensive with the insurance change and okay to stop this to

## 2022-06-25 NOTE — Patient Instructions (Signed)
Full PFT Performed Today  

## 2022-06-25 NOTE — Progress Notes (Signed)
Full PFT Performed Today  

## 2022-06-25 NOTE — Telephone Encounter (Signed)
Reaching out to patient to offer assistance regarding upcoming cardiac imaging study; pt verbalizes understanding of appt date/time, parking situation and where to check in, pre-test NPO status and medications ordered, and verified current allergies; name and call back number provided for further questions should they arise Andrea Cuadra RN Navigator Cardiac Imaging [facility] [phone] office [phone] cell 

## 2022-06-25 NOTE — Patient Instructions (Addendum)
Good luck with CT scan tmoro  Lung function is good OK to stop anoro  Try nicotine patch 14 mg/ day for 4 weeks

## 2022-06-25 NOTE — Assessment & Plan Note (Addendum)
Smoking cessation was emphasized is the most important intervention. She will need screening lung scan.  CT coronaries is scheduled for tomorrow and this may suffice She will try nicotine patch.  If this does not work we can provide Nicotrol inhaler prescription

## 2022-06-26 ENCOUNTER — Ambulatory Visit (HOSPITAL_COMMUNITY): Admission: RE | Admit: 2022-06-26 | Payer: PPO | Source: Ambulatory Visit

## 2022-06-26 ENCOUNTER — Other Ambulatory Visit (HOSPITAL_COMMUNITY): Payer: Self-pay | Admitting: *Deleted

## 2022-06-26 ENCOUNTER — Ambulatory Visit (INDEPENDENT_AMBULATORY_CARE_PROVIDER_SITE_OTHER): Payer: PPO | Admitting: Neurology

## 2022-06-26 ENCOUNTER — Encounter: Payer: Self-pay | Admitting: Neurology

## 2022-06-26 DIAGNOSIS — M48061 Spinal stenosis, lumbar region without neurogenic claudication: Secondary | ICD-10-CM

## 2022-06-26 DIAGNOSIS — M25562 Pain in left knee: Secondary | ICD-10-CM

## 2022-06-26 DIAGNOSIS — M21371 Foot drop, right foot: Secondary | ICD-10-CM

## 2022-06-26 DIAGNOSIS — G8929 Other chronic pain: Secondary | ICD-10-CM

## 2022-06-26 DIAGNOSIS — R269 Unspecified abnormalities of gait and mobility: Secondary | ICD-10-CM | POA: Diagnosis not present

## 2022-06-26 DIAGNOSIS — G35 Multiple sclerosis: Secondary | ICD-10-CM

## 2022-06-26 MED ORDER — METOPROLOL TARTRATE 50 MG PO TABS: ORAL_TABLET | ORAL | 0 refills | Status: DC

## 2022-06-26 NOTE — Addendum Note (Signed)
Addended by: Levert Feinstein on: 06/26/2022 12:11 PM   Modules accepted: Level of Service
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
## 2022-06-29 ENCOUNTER — Inpatient Hospital Stay: Admission: RE | Admit: 2022-06-29 | Payer: PPO | Source: Ambulatory Visit

## 2022-07-03 ENCOUNTER — Telehealth (HOSPITAL_COMMUNITY): Payer: Self-pay | Admitting: Emergency Medicine

## 2022-07-03 DIAGNOSIS — R079 Chest pain, unspecified: Secondary | ICD-10-CM

## 2022-07-03 MED ORDER — METOPROLOL TARTRATE 50 MG PO TABS: ORAL_TABLET | ORAL | 0 refills | Status: AC

## 2022-07-03 NOTE — Telephone Encounter (Signed)
Reaching out to patient to offer assistance regarding upcoming cardiac imaging study; pt verbalizes understanding of appt date/time, parking situation and where to check in, pre-test NPO status and medications ordered, and verified current allergies; name and call back number provided for further questions should they arise Tinya Cadogan RN Navigator Cardiac Imaging [facility] [phone] office [phone] cell 

## 2022-07-06 ENCOUNTER — Ambulatory Visit (HOSPITAL_COMMUNITY)
Admission: RE | Admit: 2022-07-06 | Discharge: 2022-07-06 | Disposition: A | Discharge status: Home / Self Care | Payer: PPO | Source: Ambulatory Visit | Attending: Student | Admitting: Student

## 2022-07-06 DIAGNOSIS — R072 Precordial pain: Secondary | ICD-10-CM | POA: Diagnosis not present

## 2022-07-06 MED ORDER — NITROGLYCERIN 0.4 MG SL SUBL
0.8000 mg | SUBLINGUAL_TABLET | Freq: Once | SUBLINGUAL | Status: AC
  Administered 2022-07-06: 0.8 mg via SUBLINGUAL

## 2022-07-06 MED ORDER — NITROGLYCERIN 0.4 MG SL SUBL
SUBLINGUAL_TABLET | SUBLINGUAL | Status: AC
  Filled 2022-07-06: qty 2

## 2022-07-06 MED ORDER — IOHEXOL 350 MG/ML SOLN
100.0000 mL | Freq: Once | INTRAVENOUS | Status: AC | PRN
  Administered 2022-07-06: 100 mL via INTRAVENOUS

## 2022-08-17 DIAGNOSIS — M79662 Pain in left lower leg: Secondary | ICD-10-CM | POA: Diagnosis not present

## 2022-08-17 DIAGNOSIS — M1712 Unilateral primary osteoarthritis, left knee: Secondary | ICD-10-CM | POA: Diagnosis not present

## 2022-08-29 ENCOUNTER — Other Ambulatory Visit: Payer: Self-pay | Admitting: Neurology

## 2022-09-01 ENCOUNTER — Other Ambulatory Visit: Payer: Self-pay

## 2022-09-01 MED ORDER — VUMERITY 231 MG PO CPDR: DELAYED_RELEASE_CAPSULE | ORAL | 0 refills | Status: DC

## 2022-09-10 DIAGNOSIS — Z23 Encounter for immunization: Secondary | ICD-10-CM | POA: Diagnosis not present

## 2022-09-10 DIAGNOSIS — F325 Major depressive disorder, single episode, in full remission: Secondary | ICD-10-CM | POA: Diagnosis not present

## 2022-09-10 DIAGNOSIS — R7303 Prediabetes: Secondary | ICD-10-CM | POA: Diagnosis not present

## 2022-09-10 DIAGNOSIS — Z0000 Encounter for general adult medical examination without abnormal findings: Secondary | ICD-10-CM | POA: Diagnosis not present

## 2022-09-10 DIAGNOSIS — I700 Atherosclerosis of aorta: Secondary | ICD-10-CM | POA: Diagnosis not present

## 2022-09-10 DIAGNOSIS — L930 Discoid lupus erythematosus: Secondary | ICD-10-CM | POA: Diagnosis not present

## 2022-09-10 DIAGNOSIS — E559 Vitamin D deficiency, unspecified: Secondary | ICD-10-CM | POA: Diagnosis not present

## 2022-09-10 DIAGNOSIS — G35 Multiple sclerosis: Secondary | ICD-10-CM | POA: Diagnosis not present

## 2022-09-10 DIAGNOSIS — M8588 Other specified disorders of bone density and structure, other site: Secondary | ICD-10-CM | POA: Diagnosis not present

## 2022-09-10 DIAGNOSIS — F17200 Nicotine dependence, unspecified, uncomplicated: Secondary | ICD-10-CM | POA: Diagnosis not present

## 2022-09-10 DIAGNOSIS — E785 Hyperlipidemia, unspecified: Secondary | ICD-10-CM | POA: Diagnosis not present

## 2022-09-10 DIAGNOSIS — J449 Chronic obstructive pulmonary disease, unspecified: Secondary | ICD-10-CM | POA: Diagnosis not present

## 2022-09-10 DIAGNOSIS — I10 Essential (primary) hypertension: Secondary | ICD-10-CM | POA: Diagnosis not present

## 2022-09-11 ENCOUNTER — Encounter: Payer: Self-pay | Admitting: Neurology

## 2022-09-11 ENCOUNTER — Encounter (HOSPITAL_BASED_OUTPATIENT_CLINIC_OR_DEPARTMENT_OTHER): Payer: Self-pay | Admitting: Pulmonary Disease

## 2022-09-17 NOTE — Telephone Encounter (Signed)
Left message for patient

## 2022-09-17 NOTE — Telephone Encounter (Signed)
Left message to patient 2nd time
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
## 2022-10-12 ENCOUNTER — Telehealth: Payer: Self-pay

## 2022-10-12 NOTE — Telephone Encounter (Signed)
Faxed preoperative risk assesment

## 2022-10-21 ENCOUNTER — Other Ambulatory Visit: Payer: Self-pay | Admitting: Student

## 2022-10-21 DIAGNOSIS — R079 Chest pain, unspecified: Secondary | ICD-10-CM

## 2022-11-02 ENCOUNTER — Telehealth: Payer: Self-pay | Admitting: *Deleted

## 2022-11-02 NOTE — Telephone Encounter (Signed)
Name: Andrea Cantrell  DOB: Sep 09, 1950  MRN: [id number]  Primary Cardiologist: Bryan Lemma, MD   Preoperative team, please contact this patient and set up a phone call appointment for further preoperative risk assessment. Please obtain consent and complete medication review. Thank you for your help.  I confirm that guidance regarding antiplatelet and oral anticoagulation therapy has been completed and, if necessary, noted below.  Patient may hold aspirin 81 mg for 5-7 days prior to procedure. Please resume when medically safe to do so.   Rip Harbour, NP 11/02/2022, 12:57 PM [facility]

## 2022-11-02 NOTE — Telephone Encounter (Signed)
Pre-operative Risk Assessment    Patient Name: Andrea Cantrell  DOB: 1950-11-05 MRN: [id number]      Request for Surgical Clearance    Procedure:   Left Total Knee Arthroplasty  Date of Surgery:  Clearance TBD                                 Surgeon:  Dr. Milly Jakob Surgeon's Group or Practice Name:  [facility] Phone number:  [phone] Fax number:  [phone]   Type of Clearance Requested:   - Medical  - Pharmacy:  Hold Aspirin Not Indicated   Type of Anesthesia:  Not Indicated   Additional requests/questions:    Signed, Emmit Pomfret   11/02/2022, 8:06 AM

## 2022-11-02 NOTE — Telephone Encounter (Signed)
   PATIENT APPOINTMENT 11-17-22   Patient Consent for Virtual Visit     Andrea Cantrell has provided verbal consent on 11/02/2022 for a virtual visit (video or telephone).   CONSENT FOR VIRTUAL VISIT FOR:  Andrea Cantrell  By participating in this virtual visit I agree to the following:  I hereby voluntarily request, consent and authorize [facility] and its employed or contracted physicians, physician assistants, nurse practitioners or other licensed health care professionals (the Practitioner), to provide me with telemedicine health care services (the "Services") as deemed necessary by the treating Practitioner. I acknowledge and consent to receive the Services by the Practitioner via telemedicine. I understand that the telemedicine visit will involve communicating with the Practitioner through live audiovisual communication technology and the disclosure of certain medical information by electronic transmission. I acknowledge that I have been given the opportunity to request an in-person assessment or other available alternative prior to the telemedicine visit and am voluntarily participating in the telemedicine visit.  I understand that I have the right to withhold or withdraw my consent to the use of telemedicine in the course of my care at any time, without affecting my right to future care or treatment, and that the Practitioner or I may terminate the telemedicine visit at any time. I understand that I have the right to inspect all information obtained and/or recorded in the course of the telemedicine visit and may receive copies of available information for a reasonable fee.  I understand that some of the potential risks of receiving the Services via telemedicine include:  Delay or interruption in medical evaluation due to technological equipment failure or disruption; Information transmitted may not be sufficient (e.g. poor resolution of images) to allow for appropriate medical  decision making by the Practitioner; and/or  In rare instances, security protocols could fail, causing a breach of personal health information.  Furthermore, I acknowledge that it is my responsibility to provide information about my medical history, conditions and care that is complete and accurate to the best of my ability. I acknowledge that Practitioner's advice, recommendations, and/or decision may be based on factors not within their control, such as incomplete or inaccurate data provided by me or distortions of diagnostic images or specimens that may result from electronic transmissions. I understand that the practice of medicine is not an exact science and that Practitioner makes no warranties or guarantees regarding treatment outcomes. I acknowledge that a copy of this consent can be made available to me via my patient portal [facility] MyChart), or I can request a printed copy by calling the office of [facility].    I understand that my insurance will be billed for this visit.   I have read or had this consent read to me. I understand the contents of this consent, which adequately explains the benefits and risks of the Services being provided via telemedicine.  I have been provided ample opportunity to ask questions regarding this consent and the Services and have had my questions answered to my satisfaction. I give my informed consent for the services to be provided through the use of telemedicine in my medical care

## 2022-11-02 NOTE — Telephone Encounter (Signed)
Patient scheduled 11/17/22.

## 2022-11-17 ENCOUNTER — Ambulatory Visit: Payer: PPO | Attending: Nurse Practitioner

## 2022-11-17 DIAGNOSIS — Z01810 Encounter for preprocedural cardiovascular examination: Secondary | ICD-10-CM

## 2022-11-17 NOTE — Progress Notes (Signed)
Virtual Visit via Telephone Note   Because of Andrea Cantrell's co-morbid illnesses, she is at least at moderate risk for complications without adequate follow up.  This format is felt to be most appropriate for this patient at this time.  The patient did not have access to video technology/had technical difficulties with video requiring transitioning to audio format only (telephone).  All issues noted in this document were discussed and addressed.  No physical exam could be performed with this format.  Please refer to the patient's chart for her consent to telehealth for Andrea Cantrell.  Evaluation Performed:  Preoperative cardiovascular risk assessment _____________   Date:  11/17/2022   Patient ID:  Andrea Cantrell, DOB 1950/09/23, MRN [id number] Patient Location:  Home Provider location:   Office  Primary Care Provider:  Laurann Montana, MD Primary Cardiologist:  Bryan Lemma, MD  Chief Complaint / Patient Profile   72 y.o. y/o female with a h/o HLD, HTN, COPD, MS, lupus, obesity, tobacco abuse who is pending left total knee arthroplasty and presents today for telephonic preoperative cardiovascular risk assessment.  History of Present Illness    [name] is a 72 y.o. female who presents via audio/video conferencing for a telehealth visit today.  Pt was last seen in cardiology clinic on 06/08/2022 by Marjie Skiff, PA.  At that time Andrea Cantrell was doing well but endorsed complaint of palpitations and nagging left-sided chest pain.  Coronary CTA was completed nonobstructive CAD and calcium score 286.  The patient is now pending procedure as outlined above. Since her last visit, she continues to have intermittent chest pain along with palpitations.  Based on patient's current symptoms and history she would be better served with a in person visit.  She will be no charge for today's visit.     Past Medical History    Past Medical History:  Diagnosis Date    Arthritis    Discoid lupus    Essential hypertension    Fibromyalgia    Heart murmur    Heavy smoker (more than 20 cigarettes per day)    Was able to stop for 11 years but restarted about 10 years ago   Hyperlipidemia with target LDL less than 130    MS (multiple sclerosis) (HCC)    Obesity (BMI 35.0-39.9 without comorbidity)    Palpitations    Pneumonia    Spinal stenosis    Past Surgical History:  Procedure Laterality Date   ABDOMINAL HYSTERECTOMY  1995   BREAST SURGERY Left    tissue removal   CESAREAN SECTION  1968 & 1971   CHOLECYSTECTOMY  1994   INCISIONAL HERNIA REPAIR N/A 12/09/2016   Procedure: LAPAROSCOPIC INCISIONAL HERNIA REPAIR WITH MESH;  Surgeon: Glenna Fellows, MD;  Location: [facility];  Service: General;  Laterality: N/A;   NM MYOVIEW LTD  October 2015   EF 71%. No ischemia or infarction. Low risk/normal   ROTATOR CUFF REPAIR Left June 2016   TOTAL HIP ARTHROPLASTY Right 08/09/2020   Procedure: TOTAL HIP ARTHROPLASTY ANTERIOR APPROACH;  Surgeon: Jodi Geralds, MD;  Location: [facility];  Service: Orthopedics;  Laterality: Right;   TRANSTHORACIC ECHOCARDIOGRAM  07/2016    (To evaluate murmur).  Normal LV size and function.  EF 60-65%.  No RWMA.  Normal diastolic function. No comment on valvular disease. -- NORMAL ECHO    Allergies  No Known Allergies  Home Medications    Prior to Admission medications   Medication Sig Start Date  End Date Taking? Authorizing Provider  albuterol (VENTOLIN HFA) 108 (90 Base) MCG/ACT inhaler Inhale 2 puffs into the lungs every 4 (four) hours as needed for wheezing or shortness of breath. For shortness of breath. 05/11/22   Carlisle Beers, FNP  amLODipine (NORVASC) 5 MG tablet Take 5 mg by mouth every morning.     [provider]  ASPIRIN 81 PO Take 81 mg by mouth daily.    [provider]  atorvastatin (LIPITOR) 10 MG tablet Take 10 mg by mouth every morning.     [provider]  baclofen (LIORESAL)  10 MG tablet Take 1 tablet (10 mg total) by mouth 4 (four) times daily. 03/19/22   Levert Feinstein, MD  Calcium Carbonate (CALCIUM 600 PO) Take 600 mg by mouth in the morning and at bedtime.    [provider]  CELEBREX 100 MG capsule Take 100 mg by mouth 2 (two) times daily. 04/30/22   [provider]  cholecalciferol (VITAMIN D) 25 MCG (1000 UNIT) tablet Take 1,000 Units by mouth daily.    [provider]  Diclofenac Sodium 1 % CREA 1 gram qid prn 03/19/22   Levert Feinstein, MD  diphenhydramine-acetaminophen (TYLENOL PM) 25-500 MG TABS tablet Take 1 tablet by mouth at bedtime.    [provider]  Diroximel Fumarate (VUMERITY) 231 MG CPDR TAKE 2 CAPSULES (462MG ) BY MOUTH TWICE DAILY. 09/01/22   Levert Feinstein, MD  docusate sodium (COLACE) 100 MG capsule Take 1 capsule (100 mg total) by mouth 2 (two) times daily. 08/09/20   Marshia Ly, PA-C  DULoxetine (CYMBALTA) 60 MG capsule Take 1 capsule (60 mg total) by mouth daily. 03/19/22   Levert Feinstein, MD  FLOVENT HFA 110 MCG/ACT inhaler Inhale 2 puffs into the lungs in the morning and at bedtime.    [provider]  gabapentin (NEURONTIN) 400 MG capsule Take 1 capsule (400 mg total) by mouth 4 (four) times daily. 03/19/22   Levert Feinstein, MD  loratadine (CLARITIN) 10 MG tablet Take 10 mg by mouth daily as needed for allergies.    [provider]  metoprolol tartrate (LOPRESSOR) 50 MG tablet Take 2 hours prior to procedure 07/03/22   Marjie Skiff E, PA-C  polyethylene glycol [facility] / GLYCOLAX) packet Uses as needed for constipation at home.  He can buy this over-the-counter at any drugstore.  Follow package instructions. 12/11/16   Sherrie George, PA-C  pseudoephedrine-guaifenesin Brand [facility] D) 60-600 MG 12 hr tablet Take 1 tablet by mouth 2 (two) times daily as needed for congestion.    [provider]  traMADol (ULTRAM) 50 MG tablet Take 50 mg by mouth 2 (two) times daily as needed.    [provider]   umeclidinium-vilanterol (ANORO ELLIPTA) 62.5-25 MCG/ACT AEPB Inhale 1 puff into the lungs daily. 06/09/22   Oretha Milch, MD  valACYclovir (VALTREX) 500 MG tablet Take 500 mg by mouth daily.    [provider]    Physical Exam    Vital Signs:  Nohelani Pecina Citro does not have vital signs available for review today.  Given telephonic nature of communication, physical exam is limited. AAOx3. NAD. Normal affect.  Speech and respirations are unlabored.  Accessory Clinical Findings    None  Assessment & Plan    1.  Preoperative Cardiovascular Risk Assessment: -Patient's RCRI score is 0.9%  Patient continues to have intermittent chest pain along with palpitations.  Patient would be better served with in person visit for further evaluation of  her symptoms  Time:   Today, I have spent  minutes with the patient with telehealth technology discussing medical history, symptoms, and management plan.     Napoleon Form, Leodis Rains, NP  11/17/2022, 7:11 AM

## 2022-11-23 ENCOUNTER — Telehealth: Payer: Self-pay | Admitting: Cardiology

## 2022-11-23 NOTE — Telephone Encounter (Signed)
See clearance request for further info.

## 2022-11-23 NOTE — Telephone Encounter (Signed)
Bolick, Andrea Cantrell A35 minutes ago (4:09 PM)   [facility] Office calling to get clearance sent over to their office. Please advise       Note   [facility] [phone]  Andrea Cantrell A    Pt is needing an in office appt based on her symptoms. Per pre op APP Robin Searing, NP:  Patient continues to have intermittent chest pain along with palpitations.  Patient would be better served with in person visit for further evaluation of her symptoms

## 2022-11-23 NOTE — Telephone Encounter (Signed)
Office calling to get clearance sent over to their office. Please advise

## 2022-12-06 NOTE — Progress Notes (Unsigned)
Cardiology Office Note:    Date:  12/07/2022  ID:  Andrea Cantrell, DOB Apr 03, 1950, MRN [id number] PCP: Laurann Montana, MD  [facility] Providers Cardiologist:  Bryan Lemma, MD       Patient Profile:      Hypertension Hyperlipidemia Aortic atherosclerosis COPD Multiple sclerosis Discoid lupus Fibromyalgia Tobacco abuse      History of Present Illness:  Discussed the use of AI scribe software for clinical note transcription with the patient, who gave verbal consent to proceed.  Andrea Cantrell is a 72 y.o. female who presents to office for clearance for left total knee arthroplasty.  Originally seen by cardiology in 10/15 for evaluation of left-sided chest pain and DOE.  Lexiscan Myoview was ordered at the time showed low risk with no evidence of ischemia.  In 6/18 it was noted patient with complaints of persistent dyspnea on exertion, echocardiogram was ordered that showed LV EF of 60 to 65% with no RWMA.  At this time it was thought the DOE was secondary to deconditioning and obesity, it is also presumed history of COPD due to longstanding history of smoking.  Seen by Dr. Herbie Baltimore on 4/23 which she reported palpitations as well as ongoing DOE.  At the time she noted occasional left sided breast pain after smoking.  This is felt to not be cardiac in nature and no ischemic workup was ordered at that time.  Last seen on 06/08/2022, at the time she noted palpitations and left-sided chest pain.  The chest pain was nonexertional and has been experiencing chest discomfort for multiple months.  She was experiencing intermittent palpitations about twice a week.  During this visit the patient noted she had tried to stop smoking but unfortunately started smoking again.  She is currently smoking 1 to 1.5 packs/day.  It was noted patient drinks a lot of caffeine, she was drinking 4 to 5 cups of coffee throughout the day with up to 9 packs of equal in each cup.  Coronary CTA was completed  showing nonobstructive CAD and a calcium score to 286, placing patient in the 83rd percentile for age and gender.  It was noted during her telephone visit preoperative cardiovascular examination on 11/17/2022, patient noted continued intermittent chest pain along with palpitations and it was recommended an in person visit.  Today, Andrea Cantrell is doing well.  She notes that she continues to have intermittent chest pain and is still smoking cigarettes.  Chest pain started roughly around 6 months ago, but she notes it may have started longer than that.  She has a hard time describing with the chest pain feels like.  The chest pain does not radiate.  Chest pain not exacerbated by activity. She denied any known alleviating or aggravating symptoms.  She notes that she does not pay much attention to the chest pain but feels like it happens mostly at rest.  She notes that she gets minimal exercise due to knee pain but activities such as walking upstairs or walking down the street do not cause chest pain.  Over the past 6 months the chest pain has not worsened and the frequency has not increased.  She notes that she feels like her chest pain is either related to her anxiety or after she is finished smoking cigarettes.  She notes baseline shortness of breath that has not worsened, is following pulmonology for emphysema.  She denied DOE, orthopnea.  We spoke regarding her cardiac CTA and plaque in each coronary artery.  We spoke about her CAD is nonobstructive and we spoke about lifestyle factors that can be implemented such as tobacco cessation, heart healthy diet, physical exercise to prevent any future cardiac events.  She denies lower extremity edema, fatigue, palpitations, melena, hematuria, hemoptysis, diaphoresis, weakness, presyncope, syncope, orthopnea, and PND.          Review of Systems  Constitutional: Negative for fever, weight gain and weight loss.  Cardiovascular:  Positive for chest pain. Negative  for claudication, cyanosis, dyspnea on exertion, irregular heartbeat, leg swelling, near-syncope, orthopnea, palpitations, paroxysmal nocturnal dyspnea and syncope.  Respiratory:  Positive for cough and shortness of breath. Negative for hemoptysis.   Gastrointestinal:  Negative for abdominal pain, hematochezia and melena.  Genitourinary:  Negative for hematuria.  Neurological:  Negative for dizziness and light-headedness.     See HPI     Studies Reviewed:   EKG Interpretation Date/Time:  Monday December 07 2022 13:54:48 EDT Ventricular Rate:  76 PR Interval:  128 QRS Duration:  82 QT Interval:  398 QTC Calculation: 447 R Axis:   7  Text Interpretation: Sinus rhythm with Premature atrial complexes When compared with ECG of 09-Dec-2016 04:33, PREVIOUS ECG IS PRESENT Confirmed by Azalee Course [phone]) on 12/07/2022 2:19:27 PM    Coronary CTA 07/06/2022  IMPRESSION: 1. Coronary artery calcium score 286 Agatston units. This places the patient in the 83rd percentile for age and gender, suggesting high risk for future cardiac events.   2.  Nonobstructive CAD.  Risk Assessment/Calculations:             Physical Exam:   VS:  BP 120/60   Pulse 76   Ht 5\' 2"  (1.575 m)   Wt 167 lb (75.8 kg)   SpO2 96%   BMI 30.54 kg/m    Wt Readings from Last 3 Encounters:  12/07/22 167 lb (75.8 kg)  06/25/22 179 lb 6.4 oz (81.4 kg)  06/25/22 179 lb 6.4 oz (81.4 kg)    Constitutional:      Appearance: Normal and healthy appearance.  Neck:     Vascular: JVD normal.  Pulmonary:     Effort: Pulmonary effort is normal.     Breath sounds: Normal breath sounds.  Chest:     Chest wall: Not tender to palpatation.  Cardiovascular:     PMI at left midclavicular line. Normal rate. Regular rhythm. Normal S1. Normal S2.      Murmurs: There is no murmur.     No gallop.  No click. No rub.  Pulses:    Intact distal pulses.  Edema:    Peripheral edema absent.  Musculoskeletal: Normal range of motion.      Cervical back: Normal range of motion and neck supple. Skin:    General: Skin is warm and dry.  Neurological:     General: No focal deficit present.     Mental Status: Alert and oriented to person, place and time.  Psychiatric:        Behavior: Behavior is cooperative.        Assessment and Plan:  Preoperative Clearance -According to the Revised Cardiac Risk Index (RCRI), her Perioperative Risk of Major Cardiac Event is (%): 0.9 -Chest pain atypical in nature.  No exertional angina.  Cardiac CTA 07/06/2022 reassuring with nonobstructive CAD. -According to AHA/ACC guidelines no further cardiovascular testing is needed, the patient may proceed to surgery with acceptable risk. -She should continue aspirin 81 mg without interruption unless bleeding risk is too high  2. Atypical chest  pain / coronary artery disease / aortic atherosclerosis -Symptoms not ischemic in nature, no chest pain on exertion more at rest -EKG today reassuring showing normal sinus rhythm -Coronary CTA 07/06/2022: With three-vessel nonobstructive CAD.  25 to 49% stenosis in proximal RCA, proximal LCx, proximal LAD -Continue aspirin 81 mg, atorvastatin 10 mg -Encouraged tobacco cessation -Mediterranean diet encouraged  3.  Hypertension -BP well-controlled 120/60 today -Continue amlodipine 5 mg  4.  Hyperlipidemia -LDL 76 on 09/10/2022 per [facility] -New goal now less than 70 with known CAD -She will trial diet and tobacco cessation number next 4 months -Can plan to increase atorvastatin if not at goal on follow-up -Continue atorvastatin 10 mg  5.  Tobacco abuse -She has tried stopping in the past but unfortunately continues to smoke -Currently 1 pack a day -We discussed importance of tobacco cessation on overall cardiac health                 Dispo:  Return in about 6 months (around 06/07/2023).  Signed, Denyce Robert, AGNP-C

## 2022-12-07 ENCOUNTER — Encounter: Payer: Self-pay | Admitting: Physician Assistant

## 2022-12-07 ENCOUNTER — Ambulatory Visit: Payer: PPO | Attending: Physician Assistant | Admitting: Emergency Medicine

## 2022-12-07 VITALS — BP 120/60 | HR 76 | Ht 62.0 in | Wt 167.0 lb

## 2022-12-07 DIAGNOSIS — I10 Essential (primary) hypertension: Secondary | ICD-10-CM | POA: Diagnosis not present

## 2022-12-07 DIAGNOSIS — Z720 Tobacco use: Secondary | ICD-10-CM

## 2022-12-07 DIAGNOSIS — I2510 Atherosclerotic heart disease of native coronary artery without angina pectoris: Secondary | ICD-10-CM

## 2022-12-07 DIAGNOSIS — Z01810 Encounter for preprocedural cardiovascular examination: Secondary | ICD-10-CM

## 2022-12-07 DIAGNOSIS — R0789 Other chest pain: Secondary | ICD-10-CM | POA: Diagnosis not present

## 2022-12-07 DIAGNOSIS — E785 Hyperlipidemia, unspecified: Secondary | ICD-10-CM | POA: Diagnosis not present

## 2022-12-07 NOTE — Patient Instructions (Signed)
Medication Instructions:  NO CHANGES *If you need a refill on your cardiac medications before your next appointment, please call your pharmacy*   Lab Work: NO LABS If you have labs (blood work) drawn today and your tests are completely normal, you will receive your results only by: MyChart Message (if you have MyChart) OR A paper copy in the mail If you have any lab test that is abnormal or we need to change your treatment, we will call you to review the results.   Testing/Procedures: NO TESTING   Follow-Up: At [facility], you and your health needs are our priority.  As part of our continuing mission to provide you with exceptional heart care, we have created designated Provider Care Teams.  These Care Teams include your primary Cardiologist (physician) and Advanced Practice Providers (APPs -  Physician Assistants and Nurse Practitioners) who all work together to provide you with the care you need, when you need it.    Your next appointment:   6 month(s)  Provider:   Bryan Lemma, MD

## 2022-12-21 ENCOUNTER — Other Ambulatory Visit: Payer: Self-pay | Admitting: Neurology

## 2023-01-18 DIAGNOSIS — M25562 Pain in left knee: Secondary | ICD-10-CM | POA: Diagnosis not present

## 2023-02-15 ENCOUNTER — Other Ambulatory Visit: Payer: Self-pay | Admitting: *Deleted

## 2023-02-15 DIAGNOSIS — Z122 Encounter for screening for malignant neoplasm of respiratory organs: Secondary | ICD-10-CM

## 2023-02-15 DIAGNOSIS — F17210 Nicotine dependence, cigarettes, uncomplicated: Secondary | ICD-10-CM

## 2023-02-15 DIAGNOSIS — Z87891 Personal history of nicotine dependence: Secondary | ICD-10-CM

## 2023-02-24 ENCOUNTER — Encounter: Payer: Self-pay | Admitting: Adult Health

## 2023-02-24 ENCOUNTER — Ambulatory Visit: Payer: PPO | Admitting: Adult Health

## 2023-02-24 DIAGNOSIS — F17210 Nicotine dependence, cigarettes, uncomplicated: Secondary | ICD-10-CM | POA: Diagnosis not present

## 2023-02-24 NOTE — Patient Instructions (Signed)

## 2023-02-24 NOTE — Progress Notes (Signed)
  Virtual Visit via Telephone Note  I connected with Sabre L Debarge , 02/24/23 11:48 AM by a telemedicine application and verified that I am speaking with the correct person using two identifiers.  Location: Patient: home Provider: home   I discussed the limitations of evaluation and management by telemedicine and the availability of in person appointments. The patient expressed understanding and agreed to proceed.   Shared Decision Making Visit Lung Cancer Screening Program [phone])   Eligibility: 73 y.o. Pack Years Smoking History Calculation =40 pack years (# packs/per year x # years smoked) Recent History of coughing up blood  no Unexplained weight loss? no ( >Than 15 pounds within the last 6 months ) Prior History Lung / other cancer no (Diagnosis within the last 5 years already requiring surveillance chest CT Scans). Smoking Status Current Smoker   Visit Components: Discussion included one or more decision making aids. YES Discussion included risk/benefits of screening. YES Discussion included potential follow up diagnostic testing for abnormal scans. YES Discussion included meaning and risk of over diagnosis. YES Discussion included meaning and risk of False Positives. YES Discussion included meaning of total radiation exposure. YES  Counseling Included: Importance of adherence to annual lung cancer LDCT screening. YES Impact of comorbidities on ability to participate in the program. YES Ability and willingness to under diagnostic treatment. YES  Smoking Cessation Counseling: Current Smokers:  Discussed importance of smoking cessation. yes Information about tobacco cessation classes and interventions provided to patient. yes Patient provided with ticket for LDCT Scan. yes Symptomatic Patient. NO Diagnosis Code: Tobacco Use Z72.0 Asymptomatic Patient yes  Counseling (Intermediate counseling: > three minutes counseling) H9563   Z12.2-Screening of respiratory  organs Z87.891-Personal history of nicotine  dependence   Lamarr Myers 02/24/23

## 2023-02-26 ENCOUNTER — Telehealth: Payer: Self-pay | Admitting: Neurology

## 2023-03-01 MED ORDER — VUMERITY 231 MG PO CPDR: DELAYED_RELEASE_CAPSULE | ORAL | 3 refills | Status: DC

## 2023-03-10 NOTE — Telephone Encounter (Signed)
Pt checking status of refill. Advised of previous note and gave contact info

## 2023-03-11 NOTE — Telephone Encounter (Signed)
Returned call to pt and lmtrc 1st attempt

## 2023-03-11 NOTE — Telephone Encounter (Signed)
Pt states [facility] is needing a authorization code. States she has 1 day left of medication. Requesting call back

## 2023-03-11 NOTE — Telephone Encounter (Signed)
Called [employer] and they stated that it was running through fine

## 2023-03-15 ENCOUNTER — Ambulatory Visit
Admission: RE | Admit: 2023-03-15 | Discharge: 2023-03-15 | Disposition: A | Discharge status: Home / Self Care | Payer: PPO | Source: Ambulatory Visit | Attending: Acute Care | Admitting: Acute Care

## 2023-03-15 DIAGNOSIS — F17210 Nicotine dependence, cigarettes, uncomplicated: Secondary | ICD-10-CM

## 2023-03-15 DIAGNOSIS — Z87891 Personal history of nicotine dependence: Secondary | ICD-10-CM

## 2023-03-15 DIAGNOSIS — Z122 Encounter for screening for malignant neoplasm of respiratory organs: Secondary | ICD-10-CM

## 2023-03-16 ENCOUNTER — Telehealth: Payer: Self-pay | Admitting: Pharmacy Technician

## 2023-03-16 ENCOUNTER — Telehealth: Payer: Self-pay | Admitting: Neurology

## 2023-03-16 NOTE — Telephone Encounter (Signed)
PA request has been Submitted. New Encounter created for follow up. For additional info see Pharmacy Prior Auth telephone encounter from VUMERITY 231MG  delayed-release capsules.

## 2023-03-16 NOTE — Telephone Encounter (Signed)
Pharmacy Patient Advocate Encounter   Received notification from Pt Calls Messages that prior authorization for VUMERITY 231MG  delayed-release capsules is required/requested.   Insurance verification completed.   The patient is insured through [facility] ADVANTAGE/RX ADVANCE .   Per test claim: PA required; PA submitted to above mentioned insurance via CoverMyMeds Key/confirmation #/EOC [id number] Status is pending

## 2023-03-16 NOTE — Telephone Encounter (Signed)
[employer] Suzy Bouchard) PA for Diroximel Fumarate (VUMERITY) 231 MG CPDR requiring more information.diagnoses, provider's specialization, patient responding to therapy. Will be faxing over request for information. Would like call back to [phone], option 2

## 2023-03-17 NOTE — Telephone Encounter (Signed)
Received communication from [facility] stating Vumerity 231 mg had been approved for pt.

## 2023-03-18 ENCOUNTER — Other Ambulatory Visit: Payer: Self-pay

## 2023-03-18 ENCOUNTER — Telehealth: Payer: Self-pay | Admitting: Neurology

## 2023-03-18 ENCOUNTER — Other Ambulatory Visit (HOSPITAL_COMMUNITY): Payer: Self-pay

## 2023-03-18 MED ORDER — VUMERITY 231 MG PO CPDR: DELAYED_RELEASE_CAPSULE | ORAL | 3 refills | Status: DC

## 2023-03-18 MED ORDER — VUMERITY 231 MG PO CPDR
DELAYED_RELEASE_CAPSULE | ORAL | 3 refills | Status: DC
  Filled 2023-03-18: qty 360, fill #0

## 2023-03-18 NOTE — Telephone Encounter (Signed)
Good Afternoon Doctor, patient called the specialty pharmacy regarding her Vumerity, per her ins [facility] is their prefered pharmacy, Please re-route RX to [facility]. Thank you   Meds ordered this encounter  Medications   Diroximel Fumarate (VUMERITY) 231 MG CPDR    Sig: TAKE 2 CAPSULES (462MG ) BY MOUTH TWICE DAILY.    Dispense:  360 capsule    Refill:  3      She needs to have a yearly follow up, OK with me or NP, last visit was in Feb 2024

## 2023-03-18 NOTE — Telephone Encounter (Signed)
Per Roland Earl, CpHT, [facility] not able to get Vumerity. Needs to be sent to [facility]

## 2023-03-18 NOTE — Addendum Note (Signed)
Addended by: Lenn Cal on: 03/18/2023 03:30 PM   Modules accepted: Orders

## 2023-03-19 ENCOUNTER — Other Ambulatory Visit: Payer: Self-pay

## 2023-03-19 NOTE — Telephone Encounter (Signed)
Pt called stating that she would like for her Vumerity [facility]. Phone # [phone] Fax # [phone]

## 2023-03-19 NOTE — Progress Notes (Signed)
Patient called in to request update on PA on Vumerity. PA is approved but [facility] is unable to order due to it be a limited distribution drug. Prescriber sent prescription to [facility] pharmacy and contact information was provided to patient.   Patient explained that she has been working on getting access to this medication for 3 weeks now and this would be the 4th pharmacy to call. She was a little hesitate about using out of state pharmacy because she only has 2 doses left of medication.   I called [facility] to run test claim for Vumerity and it was approved. I also confirmed they have access to this medication. Patient will request provider to send prescription to this pharmacy. [facility] phone and fax number were provided to patient. Patient was extremely thankful for the assistance.

## 2023-03-22 ENCOUNTER — Other Ambulatory Visit: Payer: Self-pay

## 2023-03-22 ENCOUNTER — Other Ambulatory Visit: Payer: Self-pay | Admitting: Neurology

## 2023-03-22 MED ORDER — VUMERITY 231 MG PO CPDR: DELAYED_RELEASE_CAPSULE | ORAL | 3 refills | Status: AC

## 2023-03-22 NOTE — Addendum Note (Signed)
Addended by: Danne Harbor on: 03/22/2023 07:36 AM   Modules accepted: Orders

## 2023-03-22 NOTE — Telephone Encounter (Signed)
Refilled to [email or web address]

## 2023-03-26 ENCOUNTER — Other Ambulatory Visit: Payer: Self-pay

## 2023-03-26 DIAGNOSIS — F17210 Nicotine dependence, cigarettes, uncomplicated: Secondary | ICD-10-CM

## 2023-03-26 DIAGNOSIS — Z87891 Personal history of nicotine dependence: Secondary | ICD-10-CM

## 2023-03-26 DIAGNOSIS — Z122 Encounter for screening for malignant neoplasm of respiratory organs: Secondary | ICD-10-CM

## 2023-06-10 DIAGNOSIS — G9389 Other specified disorders of brain: Secondary | ICD-10-CM | POA: Diagnosis not present

## 2023-06-10 DIAGNOSIS — G35 Multiple sclerosis: Secondary | ICD-10-CM | POA: Diagnosis not present

## 2023-06-10 DIAGNOSIS — R42 Dizziness and giddiness: Secondary | ICD-10-CM | POA: Diagnosis not present

## 2023-06-11 DIAGNOSIS — R42 Dizziness and giddiness: Secondary | ICD-10-CM | POA: Diagnosis not present

## 2023-06-11 DIAGNOSIS — G35 Multiple sclerosis: Secondary | ICD-10-CM | POA: Diagnosis not present

## 2023-06-11 NOTE — Discharge Summary (Signed)
 Observation Unit -  [facility]   [facility]  [address] [place] [address] [phone]  Date:  06/11/23     Admission Date and Time:  06/10/2023  5:46 AM  Discharge Date:  06/11/2023   Admitting Provider:  Arthea JONELLE Sep, PA-C Discharge Provider: Arthea JONELLE Sep, PA-C Primary Care Provider:  No primary care provider on file.    Chief Complaint: Increased dizziness and feeling off balance since yesterday   Discharge Diagnosis: 1. Dizziness   2. Multiple sclerosis, primary chronic progressive    (CMD)      Consulting Physicians: Dr. Camellia Edu (Neurology)                                                   Details of [facility] Course: Patient with PMHx of multiple sclerosis, hyperlipidemia, hypertension, discoid lupus, arthritis, spinal stenosis, and tobacco use was place in the Observation Unit for evaluation of dizziness after taking a hot shower yesterday.  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  While in the Obs Unit, the patient did well, remained neurologically intact, maintained stable vital signs & remained asymptomatic throughout Observation course.   Throughout Observation course, Pt did not develop any new neuro symptoms, chest pain/palpitations, change in mental status, seizure.   I discussed this case with Dr. Camellia Edu, neurologist, who recommended limiting hot showers/sauna and following up outpatient with her neurologist.  PT/OT consult was done - see PT/OT note for details.  Patient was given orders for outpatient physical therapy for mobility/gait therapy in the setting of multiple sclerosis.  Pt is able to tolerate PO diet and on serial exams there is not evidence of life-threatening cardiac dysrhythmia or any other diagnoses requiring hospital admission.  Pt meets discharge criteria and is deemed  appropriate for discharge and will follow-up with PCP and neurology.   Pt will be given a prescription for Symbicort as she states that she is currently out of her Ellipta but preferred to the Symbicort that she received in the hospital.  Pt understands that although that ED course and Observation Unit course did not yield any overt evidence of alternate diagnosis requiring hospitalization, close outpt followup is recommended. I also discussed and Pt understands the immediate Return to Emergency Department precautions including but not limited to return of symptoms, worsening headache or change in nature, development of vomiting, confusion, agitation, extreme drowsiness, slurred speech, weakness, numbness/tingling, problem with coordination, seizure, loss of consciousness, or development of any other new symptoms. I have seen and evaluated this patient with supervising physician Dr. Darice Mains.  Please also see physician documentation.  Discharge Medications:   Medication List     START taking these medications    budesonide-formoteroL 160-4.5 mcg/actuation inhaler Commonly known as: SYMBICORT;BREYNA Inhale 2 puffs  in the morning and 2 puffs before bedtime.       ASK your doctor about these medications    acetaminophen  500 mg tablet Commonly known as: TYLENOL  Take 500 mg by mouth at bedtime.   albuterol  HFA 90 mcg/actuation inhaler Commonly known as: PROVENTIL  HFA;VENTOLIN  HFA;PROAIR  HFA Inhale 2 puffs 2 (two) times a day.   amLODIPine  5 mg tablet Commonly known as:  NORVASC  Take 5 mg by mouth daily.   ascorbic acid 500 mg tablet Commonly known as: VITAMIN C Take 600 mg by mouth 2 (two) times a day.   aspirin  81 mg chewable tablet Chew 81 mg daily.   atorvastatin  10 mg tablet Commonly known as: LIPITOR Take 10 mg by mouth at bedtime.   baclofen  10 mg tablet Commonly known as: LIORESAL  Take 10 mg by mouth 4 (four) times a day.   cholecalciferol  1,000 unit (25 mcg)  tablet Commonly known as: VITAMIN D3 Take 1,000 Units by mouth daily.   dextromethorphan-guaiFENesin 30-600 mg per 12 hr tablet Commonly known as: MUCINEX DM Take 1 tablet by mouth 2 (two) times a day.   docusate sodium  100 mg capsule Commonly known as: COLACE Take 100 mg by mouth daily as needed for constipation.   DULoxetine  60 mg capsule Commonly known as: CYMBALTA  Take 60 mg by mouth daily.   gabapentin  400 mg capsule Commonly known as: NEURONTIN  Take 400 mg by mouth 4 (four) times a day.   loratadine 10 mg tablet Commonly known as: CLARITIN Take 10 mg by mouth daily as needed for allergies.   polyethylene glycol 17 gram packet Commonly known as: GLYCOLAX  Take 17 g by mouth daily as needed for constipation.   traMADoL  50 mg tablet Commonly known as: ULTRAM  Take 50 mg by mouth 2 (two) times a day as needed for severe pain (7-10).   umeclidinium-vilanteroL 62.5-25 mcg/actuation Dsdv Commonly known as: ANORO ELLIPTA  Inhale 1 puff daily.   valACYclovir  500 mg tablet Commonly known as: VALTREX  Take 500 mg by mouth daily.   * Vumerity  231 mg Cpdr Generic drug: diROXimel fumarate  Take 2 capsules by mouth twice daily   * Vumerity  231 mg Cpdr Generic drug: diROXimel fumarate  Take 2 capsules by mouth twice daily. (TAKE 2 CAPSULES (462MG ) BY MOUTH TWICE DAILY.)      * * This list has 2 medication(s) that are the same as other medications prescribed for you. Read the directions carefully, and ask your doctor or other care provider to review them with you.            Where to Get Your Medications     These medications were sent to [facility] [address], [place] - [address] - PHONE: [phone] - FAX: [phone]  [address], [place] [address]    Phone: [phone]  budesonide-formoteroL 160-4.5 mcg/actuation inhaler       Physical Exam on Day of Discharge   BP 146/77   Pulse 80   Temp 98.6 F (37 C) (Oral)    Resp 16   Wt 78 kg (172 lb)   SpO2 99%    Constitutional: Resting comfortably. NAD. Alert and awake. Vital signs reviewed HEENT: [place]/AT Head.  Conjunctiva clear. Throat clear.  Neck:  Supple.  No cervical lymphadenopathy. No JVD. No carotid bruits. Cardiovascular: RRR. No murmurs, rubs, or gallops. Normal S1, S2.  Pulmonary/Chest:  CTA. No wheezes, rhonchi, or rales. Equal BS bilaterally.  Abdominal: Soft. Non-tender. Non-distended. Back: No CVAT.   Extremities: No Cyanosis/Clubbing/Edema. Normal and equal distal pulses.  Skin: No diaphoresis. Warm and dry. No rash. Neurological: Grossly non-focal. Psychological: Normal affect   DISCHARGE  PLAN   Discharge Disposition:  home   Discharge Condition:  stable   Diet:  regular Activity:   as tolerated        Pending Follow Up Orders:  None  Follow Up Appointments: PCP and neurology  Pertinent Diagnostic & Test Results: MR showing multiple foci of T2 signal change are seen in the cerebral white matter. T2 signal changes are present in the pons secondary to her known multiple sclerosis that appear chronic.  Time Dedicated to Discharge Planning:  38 minutes         Arthea JONELLE Sep, [place]   Comment: Please note this report has been produced using speech recognition software and may contain errors related to that system including errors in grammar, punctuation, and spelling, as well as words and phrases that may be inappropriate. If there are any questions or concerns please feel free to contact me for clarification. *Some images could not be shown.

## 2023-07-26 DIAGNOSIS — I10 Essential (primary) hypertension: Secondary | ICD-10-CM | POA: Diagnosis not present

## 2023-07-26 DIAGNOSIS — J449 Chronic obstructive pulmonary disease, unspecified: Secondary | ICD-10-CM | POA: Diagnosis not present

## 2023-07-26 DIAGNOSIS — I2510 Atherosclerotic heart disease of native coronary artery without angina pectoris: Secondary | ICD-10-CM | POA: Diagnosis not present

## 2023-08-04 ENCOUNTER — Telehealth: Payer: Self-pay | Admitting: Neurology

## 2023-08-04 MED ORDER — GABAPENTIN 400 MG PO CAPS: 400.0000 mg | ORAL_CAPSULE | Freq: Four times a day (QID) | ORAL | 10 refills | Status: AC

## 2023-08-04 MED ORDER — BACLOFEN 10 MG PO TABS: 10.0000 mg | ORAL_TABLET | Freq: Four times a day (QID) | ORAL | 10 refills | Status: AC

## 2023-08-04 NOTE — Telephone Encounter (Signed)
 refilled

## 2023-08-04 NOTE — Telephone Encounter (Signed)
 Pt called stating that she is needing a refill on her baclofen  (LIORESAL ) 10 MG tablet and her gabapentin  (NEURONTIN ) 400 MG capsule and she is needing it sent to the [facility] on [address]

## 2023-08-16 ENCOUNTER — Telehealth: Payer: Self-pay | Admitting: Neurology

## 2023-08-16 DIAGNOSIS — I10 Essential (primary) hypertension: Secondary | ICD-10-CM | POA: Diagnosis not present

## 2023-08-16 DIAGNOSIS — E785 Hyperlipidemia, unspecified: Secondary | ICD-10-CM | POA: Diagnosis not present

## 2023-08-16 DIAGNOSIS — F325 Major depressive disorder, single episode, in full remission: Secondary | ICD-10-CM | POA: Diagnosis not present

## 2023-08-16 DIAGNOSIS — J449 Chronic obstructive pulmonary disease, unspecified: Secondary | ICD-10-CM | POA: Diagnosis not present

## 2023-08-16 DIAGNOSIS — I2510 Atherosclerotic heart disease of native coronary artery without angina pectoris: Secondary | ICD-10-CM | POA: Diagnosis not present

## 2023-08-16 MED ORDER — DULOXETINE HCL 60 MG PO CPEP: 60.0000 mg | ORAL_CAPSULE | Freq: Every day | ORAL | 10 refills | Status: AC

## 2023-08-16 NOTE — Telephone Encounter (Signed)
 Patient requesting a refill for DULoxetine  (CYMBALTA ) 60 MG capsule. Send to [facility] [phone]. Scheduled appointment on 01/10/24 at 11:30 am

## 2023-08-16 NOTE — Telephone Encounter (Signed)
 refilled

## 2023-08-28 DIAGNOSIS — I2510 Atherosclerotic heart disease of native coronary artery without angina pectoris: Secondary | ICD-10-CM | POA: Diagnosis not present

## 2023-08-28 DIAGNOSIS — J449 Chronic obstructive pulmonary disease, unspecified: Secondary | ICD-10-CM | POA: Diagnosis not present

## 2023-08-28 DIAGNOSIS — I10 Essential (primary) hypertension: Secondary | ICD-10-CM | POA: Diagnosis not present

## 2023-09-02 DIAGNOSIS — M1712 Unilateral primary osteoarthritis, left knee: Secondary | ICD-10-CM | POA: Diagnosis not present

## 2023-09-16 DIAGNOSIS — F325 Major depressive disorder, single episode, in full remission: Secondary | ICD-10-CM | POA: Diagnosis not present

## 2023-09-16 DIAGNOSIS — E785 Hyperlipidemia, unspecified: Secondary | ICD-10-CM | POA: Diagnosis not present

## 2023-09-16 DIAGNOSIS — J449 Chronic obstructive pulmonary disease, unspecified: Secondary | ICD-10-CM | POA: Diagnosis not present

## 2023-09-16 DIAGNOSIS — I2510 Atherosclerotic heart disease of native coronary artery without angina pectoris: Secondary | ICD-10-CM | POA: Diagnosis not present

## 2023-09-16 DIAGNOSIS — I10 Essential (primary) hypertension: Secondary | ICD-10-CM | POA: Diagnosis not present

## 2023-09-27 DIAGNOSIS — J449 Chronic obstructive pulmonary disease, unspecified: Secondary | ICD-10-CM | POA: Diagnosis not present

## 2023-09-27 DIAGNOSIS — I10 Essential (primary) hypertension: Secondary | ICD-10-CM | POA: Diagnosis not present

## 2023-09-27 DIAGNOSIS — I2510 Atherosclerotic heart disease of native coronary artery without angina pectoris: Secondary | ICD-10-CM | POA: Diagnosis not present

## 2023-10-01 ENCOUNTER — Other Ambulatory Visit: Payer: Self-pay | Admitting: Neurology

## 2023-10-04 DIAGNOSIS — J449 Chronic obstructive pulmonary disease, unspecified: Secondary | ICD-10-CM | POA: Diagnosis not present

## 2023-10-04 DIAGNOSIS — G35 Multiple sclerosis: Secondary | ICD-10-CM | POA: Diagnosis not present

## 2023-10-04 DIAGNOSIS — I700 Atherosclerosis of aorta: Secondary | ICD-10-CM | POA: Diagnosis not present

## 2023-10-04 DIAGNOSIS — F325 Major depressive disorder, single episode, in full remission: Secondary | ICD-10-CM | POA: Diagnosis not present

## 2023-10-04 DIAGNOSIS — I10 Essential (primary) hypertension: Secondary | ICD-10-CM | POA: Diagnosis not present

## 2023-10-04 DIAGNOSIS — L930 Discoid lupus erythematosus: Secondary | ICD-10-CM | POA: Diagnosis not present

## 2023-10-04 DIAGNOSIS — R7303 Prediabetes: Secondary | ICD-10-CM | POA: Diagnosis not present

## 2023-10-04 DIAGNOSIS — M8588 Other specified disorders of bone density and structure, other site: Secondary | ICD-10-CM | POA: Diagnosis not present

## 2023-10-04 DIAGNOSIS — E559 Vitamin D deficiency, unspecified: Secondary | ICD-10-CM | POA: Diagnosis not present

## 2023-10-04 DIAGNOSIS — E785 Hyperlipidemia, unspecified: Secondary | ICD-10-CM | POA: Diagnosis not present

## 2023-10-04 DIAGNOSIS — F17200 Nicotine dependence, unspecified, uncomplicated: Secondary | ICD-10-CM | POA: Diagnosis not present

## 2023-10-04 DIAGNOSIS — Z0000 Encounter for general adult medical examination without abnormal findings: Secondary | ICD-10-CM | POA: Diagnosis not present

## 2023-10-17 DIAGNOSIS — F325 Major depressive disorder, single episode, in full remission: Secondary | ICD-10-CM | POA: Diagnosis not present

## 2023-10-17 DIAGNOSIS — I2510 Atherosclerotic heart disease of native coronary artery without angina pectoris: Secondary | ICD-10-CM | POA: Diagnosis not present

## 2023-10-17 DIAGNOSIS — I10 Essential (primary) hypertension: Secondary | ICD-10-CM | POA: Diagnosis not present

## 2023-10-17 DIAGNOSIS — J449 Chronic obstructive pulmonary disease, unspecified: Secondary | ICD-10-CM | POA: Diagnosis not present

## 2023-10-17 DIAGNOSIS — E785 Hyperlipidemia, unspecified: Secondary | ICD-10-CM | POA: Diagnosis not present

## 2023-10-27 DIAGNOSIS — I10 Essential (primary) hypertension: Secondary | ICD-10-CM | POA: Diagnosis not present

## 2023-10-27 DIAGNOSIS — I2510 Atherosclerotic heart disease of native coronary artery without angina pectoris: Secondary | ICD-10-CM | POA: Diagnosis not present

## 2023-10-27 DIAGNOSIS — J449 Chronic obstructive pulmonary disease, unspecified: Secondary | ICD-10-CM | POA: Diagnosis not present

## 2023-11-15 DIAGNOSIS — I10 Essential (primary) hypertension: Secondary | ICD-10-CM | POA: Diagnosis not present

## 2023-11-15 DIAGNOSIS — E785 Hyperlipidemia, unspecified: Secondary | ICD-10-CM | POA: Diagnosis not present

## 2023-11-15 DIAGNOSIS — E559 Vitamin D deficiency, unspecified: Secondary | ICD-10-CM | POA: Diagnosis not present

## 2023-11-15 DIAGNOSIS — R7303 Prediabetes: Secondary | ICD-10-CM | POA: Diagnosis not present

## 2023-11-16 DIAGNOSIS — I10 Essential (primary) hypertension: Secondary | ICD-10-CM | POA: Diagnosis not present

## 2023-11-16 DIAGNOSIS — I2510 Atherosclerotic heart disease of native coronary artery without angina pectoris: Secondary | ICD-10-CM | POA: Diagnosis not present

## 2023-11-16 DIAGNOSIS — E785 Hyperlipidemia, unspecified: Secondary | ICD-10-CM | POA: Diagnosis not present

## 2023-11-16 DIAGNOSIS — F325 Major depressive disorder, single episode, in full remission: Secondary | ICD-10-CM | POA: Diagnosis not present

## 2023-11-16 DIAGNOSIS — J449 Chronic obstructive pulmonary disease, unspecified: Secondary | ICD-10-CM | POA: Diagnosis not present

## 2023-11-26 DIAGNOSIS — I10 Essential (primary) hypertension: Secondary | ICD-10-CM | POA: Diagnosis not present

## 2023-11-26 DIAGNOSIS — I2510 Atherosclerotic heart disease of native coronary artery without angina pectoris: Secondary | ICD-10-CM | POA: Diagnosis not present

## 2023-11-26 DIAGNOSIS — J449 Chronic obstructive pulmonary disease, unspecified: Secondary | ICD-10-CM | POA: Diagnosis not present

## 2023-12-17 DIAGNOSIS — I10 Essential (primary) hypertension: Secondary | ICD-10-CM | POA: Diagnosis not present

## 2023-12-17 DIAGNOSIS — I2510 Atherosclerotic heart disease of native coronary artery without angina pectoris: Secondary | ICD-10-CM | POA: Diagnosis not present

## 2023-12-17 DIAGNOSIS — F325 Major depressive disorder, single episode, in full remission: Secondary | ICD-10-CM | POA: Diagnosis not present

## 2023-12-17 DIAGNOSIS — J449 Chronic obstructive pulmonary disease, unspecified: Secondary | ICD-10-CM | POA: Diagnosis not present

## 2023-12-17 DIAGNOSIS — E785 Hyperlipidemia, unspecified: Secondary | ICD-10-CM | POA: Diagnosis not present

## 2023-12-20 ENCOUNTER — Ambulatory Visit: Admitting: Neurology

## 2023-12-26 DIAGNOSIS — J449 Chronic obstructive pulmonary disease, unspecified: Secondary | ICD-10-CM | POA: Diagnosis not present

## 2023-12-26 DIAGNOSIS — I2510 Atherosclerotic heart disease of native coronary artery without angina pectoris: Secondary | ICD-10-CM | POA: Diagnosis not present

## 2023-12-26 DIAGNOSIS — I10 Essential (primary) hypertension: Secondary | ICD-10-CM | POA: Diagnosis not present

## 2024-01-10 ENCOUNTER — Ambulatory Visit: Admitting: Neurology

## 2024-01-10 ENCOUNTER — Telehealth: Payer: Self-pay | Admitting: Neurology

## 2024-01-10 NOTE — Telephone Encounter (Signed)
Below message noted.

## 2024-01-10 NOTE — Telephone Encounter (Signed)
 Pt cx due to death in family

## 2024-01-16 DIAGNOSIS — I2510 Atherosclerotic heart disease of native coronary artery without angina pectoris: Secondary | ICD-10-CM | POA: Diagnosis not present

## 2024-01-16 DIAGNOSIS — J449 Chronic obstructive pulmonary disease, unspecified: Secondary | ICD-10-CM | POA: Diagnosis not present

## 2024-01-16 DIAGNOSIS — I10 Essential (primary) hypertension: Secondary | ICD-10-CM | POA: Diagnosis not present

## 2024-01-16 DIAGNOSIS — F325 Major depressive disorder, single episode, in full remission: Secondary | ICD-10-CM | POA: Diagnosis not present

## 2024-01-16 DIAGNOSIS — E785 Hyperlipidemia, unspecified: Secondary | ICD-10-CM | POA: Diagnosis not present

## 2024-01-18 DIAGNOSIS — M170 Bilateral primary osteoarthritis of knee: Secondary | ICD-10-CM | POA: Diagnosis not present

## 2024-01-18 DIAGNOSIS — M1711 Unilateral primary osteoarthritis, right knee: Secondary | ICD-10-CM | POA: Diagnosis not present

## 2024-01-18 DIAGNOSIS — M1712 Unilateral primary osteoarthritis, left knee: Secondary | ICD-10-CM | POA: Diagnosis not present

## 2024-05-25 ENCOUNTER — Ambulatory Visit: Admitting: Neurology
